# Patient Record
Sex: Female | Born: 1995 | Race: White | Hispanic: No | Marital: Married | Smoking: Current every day smoker
Health system: Southern US, Community
[De-identification: ages and names within clinical notes are randomized; demographics above are authoritative.]

## PROBLEM LIST (undated history)

## (undated) ENCOUNTER — Inpatient Hospital Stay: Payer: Self-pay

## (undated) ENCOUNTER — Inpatient Hospital Stay: Admission: RE | Payer: Medicaid Other | Source: Home / Self Care

## (undated) DIAGNOSIS — T7840XA Allergy, unspecified, initial encounter: Secondary | ICD-10-CM

## (undated) DIAGNOSIS — N12 Tubulo-interstitial nephritis, not specified as acute or chronic: Secondary | ICD-10-CM

## (undated) DIAGNOSIS — E669 Obesity, unspecified: Secondary | ICD-10-CM

## (undated) DIAGNOSIS — O2613 Low weight gain in pregnancy, third trimester: Secondary | ICD-10-CM

## (undated) DIAGNOSIS — I471 Supraventricular tachycardia, unspecified: Secondary | ICD-10-CM

## (undated) DIAGNOSIS — G43909 Migraine, unspecified, not intractable, without status migrainosus: Secondary | ICD-10-CM

## (undated) DIAGNOSIS — R001 Bradycardia, unspecified: Secondary | ICD-10-CM

## (undated) DIAGNOSIS — T8859XA Other complications of anesthesia, initial encounter: Secondary | ICD-10-CM

## (undated) DIAGNOSIS — K8020 Calculus of gallbladder without cholecystitis without obstruction: Secondary | ICD-10-CM

## (undated) DIAGNOSIS — Z3490 Encounter for supervision of normal pregnancy, unspecified, unspecified trimester: Secondary | ICD-10-CM

## (undated) DIAGNOSIS — F419 Anxiety disorder, unspecified: Secondary | ICD-10-CM

## (undated) DIAGNOSIS — N809 Endometriosis, unspecified: Secondary | ICD-10-CM

## (undated) DIAGNOSIS — T4145XA Adverse effect of unspecified anesthetic, initial encounter: Secondary | ICD-10-CM

## (undated) DIAGNOSIS — K219 Gastro-esophageal reflux disease without esophagitis: Secondary | ICD-10-CM

## (undated) DIAGNOSIS — Z62810 Personal history of physical and sexual abuse in childhood: Secondary | ICD-10-CM

## (undated) DIAGNOSIS — E66811 Obesity, class 1: Secondary | ICD-10-CM

## (undated) DIAGNOSIS — J45909 Unspecified asthma, uncomplicated: Secondary | ICD-10-CM

## (undated) DIAGNOSIS — B2790 Infectious mononucleosis, unspecified without complication: Secondary | ICD-10-CM

## (undated) DIAGNOSIS — O210 Mild hyperemesis gravidarum: Secondary | ICD-10-CM

## (undated) DIAGNOSIS — O321XX0 Maternal care for breech presentation, not applicable or unspecified: Secondary | ICD-10-CM

## (undated) HISTORY — DX: Obesity, unspecified: E66.9

## (undated) HISTORY — DX: Low weight gain in pregnancy, third trimester: O26.13

## (undated) HISTORY — PX: WISDOM TOOTH EXTRACTION: SHX21

## (undated) HISTORY — DX: Obesity, class 1: E66.811

## (undated) HISTORY — DX: Endometriosis, unspecified: N80.9

## (undated) HISTORY — DX: Maternal care for breech presentation, not applicable or unspecified: O32.1XX0

## (undated) HISTORY — DX: Encounter for supervision of normal pregnancy, unspecified, unspecified trimester: Z34.90

## (undated) HISTORY — DX: Personal history of physical and sexual abuse in childhood: Z62.810

## (undated) HISTORY — DX: Tubulo-interstitial nephritis, not specified as acute or chronic: N12

## (undated) HISTORY — DX: Calculus of gallbladder without cholecystitis without obstruction: K80.20

## (undated) HISTORY — PX: APPENDECTOMY: SHX54

## (undated) HISTORY — DX: Bradycardia, unspecified: R00.1

## (undated) HISTORY — DX: Allergy, unspecified, initial encounter: T78.40XA

---
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
## 2016-12-15 ENCOUNTER — Ambulatory Visit
Admission: EM | Admit: 2016-12-15 | Discharge: 2016-12-15 | Disposition: A | Discharge status: Home / Self Care | Payer: Medicaid Other | Attending: Family Medicine | Admitting: Family Medicine

## 2016-12-15 ENCOUNTER — Encounter: Payer: Self-pay | Admitting: *Deleted

## 2016-12-15 DIAGNOSIS — R42 Dizziness and giddiness: Secondary | ICD-10-CM | POA: Diagnosis not present

## 2016-12-15 DIAGNOSIS — R110 Nausea: Secondary | ICD-10-CM

## 2016-12-15 LAB — PREGNANCY, URINE: PREG TEST UR: NEGATIVE

## 2016-12-15 MED ORDER — MECLIZINE HCL 25 MG PO TABS
25.0000 mg | ORAL_TABLET | Freq: Three times a day (TID) | ORAL | 0 refills | Status: DC | PRN
Start: 2016-12-15 — End: 2017-04-26

## 2016-12-15 NOTE — ED Triage Notes (Signed)
C/O Dizzines with some nausea x2 weeks. Denies sore throat, fever or chills. Pt states she "might be pregnant". Pt states she has taken 4 pregnancy tests and they were all  negative.

## 2016-12-15 NOTE — ED Provider Notes (Signed)
[facility]   CSN: [id number] Arrival date & time: 12/15/16  1545   History   Chief Complaint Chief Complaint  Patient presents with  . Near Syncope  . Nausea   HPI  21 year old female presents with dizziness/lightheadedness and nausea.  Patient states she has had intermittent nausea and dizziness/lightheadedness for the past 2 weeks.  She has had approximately 10 times.  Worse with movement.  Lasts briefly and resolves, particularly with rest.  She states that the dizziness/lightheadedness seems to come on first.  She describes it as a sense of motion and as if she is going to fall.  Associated nausea but no vomiting.  She been trying to stay hydrated to improve this but has had no improvement.  No other medications or interventions have been tried.  She was concerned that she may be pregnant as she is no longer on birth control.  She is taken 4 home pregnancy tests and they have been all negative.  However, this was done several days ago.  Her last menstrual cycle was October 3.  She states that she is 7 days late for her current menstrual cycle.  However, she states that she has been irregular as of late and this is new.  In addition to the above, she states that she has had times where she felt as if her eyes were moving abruptly when she was dizzy/lightheaded.  No other associated symptoms.  No other complaints or concerns at this time.  Past Medical History:  Diagnosis Date  . Allergy    Seasonal  . Asthma   . Migraine   . Mononucleosis    Past Surgical History:  Procedure Laterality Date  . WISDOM TOOTH EXTRACTION     OB History    No data available     Home Medications    Family History Family History  Problem Relation Age of Onset  . Hypertension Mother   . Depression Mother   . Hypertension Maternal Grandmother    Social History Social History   Tobacco Use  . Smoking status: Never Smoker  . Smokeless tobacco: Never Used  Substance Use Topics    . Alcohol use: No  . Drug use: No   Allergies   Bee venom and Shellfish allergy   Review of Systems Review of Systems  Eyes:       Rapid eye movement.  Gastrointestinal: Positive for nausea. Negative for vomiting.  Neurological: Positive for dizziness and light-headedness.   Physical Exam Triage Vital Signs ED Triage Vitals  Enc Vitals Group     BP 12/15/16 1601 121/76     Pulse Rate 12/15/16 1601 (!) 58     Resp 12/15/16 1601 14     Temp 12/15/16 1601 98.1 F (36.7 C)     Temp Source 12/15/16 1601 Oral     SpO2 12/15/16 1601 99 %     Weight 12/15/16 1602 217 lb (98.4 kg)     Height 12/15/16 1602 5\' 6"  (1.676 m)     Head Circumference --      Peak Flow --      Pain Score --      Pain Loc --      Pain Edu? --      Excl. in GC? --    Updated Vital Signs BP 121/76 (BP Location: Left Arm)   Pulse (!) 58   Temp 98.1 F (36.7 C) (Oral)   Resp 14   Ht 5\' 6"  (1.676 m)  Wt 217 lb (98.4 kg)   LMP 11/09/2016   SpO2 99%   BMI 35.02 kg/m   Physical Exam  Constitutional: She is oriented to person, place, and time. She appears well-developed and well-nourished. No distress.  HENT:  Head: Normocephalic and atraumatic.  Mouth/Throat: Oropharynx is clear and moist.  Eyes: Conjunctivae and EOM are normal. No scleral icterus.  Neck: Neck supple.  Cardiovascular: Regular rhythm.  No murmur heard. Bradycardic.  Pulmonary/Chest: Effort normal and breath sounds normal. She has no wheezes. She has no rales.  Lymphadenopathy:    She has no cervical adenopathy.  Neurological: She is alert and oriented to person, place, and time.  Gilberto Betterix Hallpike - symptoms provoked with test but no nystagmus noted.  Psychiatric: She has a normal mood and affect. Her behavior is normal.  Vitals reviewed.  UC Treatments / Results  Labs (all labs ordered are listed, but only abnormal results are displayed) Labs Reviewed  PREGNANCY, URINE    EKG  EKG Interpretation None        Radiology No results found.  Procedures Procedures (including critical care time)  Medications Ordered in UC Medications - No data to display   Initial Impression / Assessment and Plan / UC Course  I have reviewed the triage vital signs and the nursing notes.  Pertinent labs & imaging results that were available during my care of the patient were reviewed by me and considered in my medical decision making (see chart for details).     21 year old female presents with nausea, lightheadedness/dizziness. Pregnancy test negative. Clinically appears to have vertigo. Treating with Meclizine. Advised vestibular rehab (to come from primary referral) if does not improve quickly.   Final Clinical Impressions(s) / UC Diagnoses   Final diagnoses:  Vertigo    ED Discharge Orders        Ordered    meclizine (ANTIVERT) 25 MG tablet  3 times daily PRN     12/15/16 1635     Controlled Substance Prescriptions [place] Controlled Substance Registry consulted? Not Applicable   Tommie SamsCook,  G, DO 12/15/16 1642

## 2017-02-07 NOTE — L&D Delivery Note (Signed)
Delivery Summary for Shontae M Boice  Labor Events:   Preterm labor:   Rupture date:   Rupture time:   Rupture type:   Fluid Color:   Induction:   Augmentation:   Complications:   Cervical ripening:          Delivery:   Episiotomy:   Lacerations:   Repair suture:   Repair # of packets:   Blood loss (ml):    Information for the patient's newborn:  Jerrol BananaCook, Boy Alysen [[id number]][[id number]]    Delivery 09/27/2017 12:36 PM by  C-Section, Low Transverse Sex:  female Gestational Age: 1771w2d Delivery Clinician:   Living?:         APGARS  One minute Five minutes Ten minutes  Skin color:        Heart rate:        Grimace:        Muscle tone:        Breathing:        Totals: 8  8      Presentation/position:      Resuscitation:   Cord information:    Disposition of cord blood:     Blood gases sent?  Complications:   Placenta: Delivered:       appearance Newborn Measurements: Weight: 8 lb 9.6 oz (3900 g)  Height: 20.5"  Head circumference:    Chest circumference:    Other providers:    Additional  information: Forceps:   Vacuum:   Breech:   Observed anomalies        See Dr. Oretha Milchherry's C-section note for details of procedure.   Hildred Laserherry, , MD [facility]

## 2017-03-01 LAB — OB RESULTS CONSOLE ANTIBODY SCREEN: ANTIBODY SCREEN: NEGATIVE

## 2017-03-01 LAB — OB RESULTS CONSOLE RUBELLA ANTIBODY, IGM: Rubella: NON-IMMUNE/NOT IMMUNE

## 2017-03-01 LAB — OB RESULTS CONSOLE HGB/HCT, BLOOD
HCT: 38
Hemoglobin: 12.5

## 2017-03-01 LAB — OB RESULTS CONSOLE HEPATITIS B SURFACE ANTIGEN: HEP B S AG: NEGATIVE

## 2017-03-01 LAB — OB RESULTS CONSOLE GC/CHLAMYDIA
CHLAMYDIA, DNA PROBE: NEGATIVE
GC PROBE AMP, GENITAL: NEGATIVE

## 2017-03-01 LAB — OB RESULTS CONSOLE PLATELET COUNT: Platelets: 235

## 2017-03-01 LAB — OB RESULTS CONSOLE ABO/RH: RH Type: POSITIVE

## 2017-03-01 LAB — CYSTIC FIBROSIS DIAGNOSTIC STUDY: INTERPRETATION-CFDNA: NEGATIVE

## 2017-03-01 LAB — OB RESULTS CONSOLE VARICELLA ZOSTER ANTIBODY, IGG: VARICELLA IGG: NON-IMMUNE/NOT IMMUNE

## 2017-03-01 LAB — OB RESULTS CONSOLE HIV ANTIBODY (ROUTINE TESTING): HIV: NONREACTIVE

## 2017-03-15 DIAGNOSIS — Z315 Encounter for genetic counseling: Secondary | ICD-10-CM | POA: Insufficient documentation

## 2017-03-28 ENCOUNTER — Emergency Department
Admission: EM | Admit: 2017-03-28 | Discharge: 2017-03-28 | Disposition: A | Discharge status: Home / Self Care | Payer: PRIVATE HEALTH INSURANCE | Attending: Emergency Medicine | Admitting: Emergency Medicine

## 2017-03-28 ENCOUNTER — Encounter: Payer: Self-pay | Admitting: Emergency Medicine

## 2017-03-28 DIAGNOSIS — O2342 Unspecified infection of urinary tract in pregnancy, second trimester: Secondary | ICD-10-CM | POA: Insufficient documentation

## 2017-03-28 DIAGNOSIS — Z3A13 13 weeks gestation of pregnancy: Secondary | ICD-10-CM | POA: Insufficient documentation

## 2017-03-28 DIAGNOSIS — J45909 Unspecified asthma, uncomplicated: Secondary | ICD-10-CM | POA: Diagnosis not present

## 2017-03-28 DIAGNOSIS — O210 Mild hyperemesis gravidarum: Secondary | ICD-10-CM | POA: Insufficient documentation

## 2017-03-28 DIAGNOSIS — O2341 Unspecified infection of urinary tract in pregnancy, first trimester: Secondary | ICD-10-CM

## 2017-03-28 DIAGNOSIS — O219 Vomiting of pregnancy, unspecified: Secondary | ICD-10-CM | POA: Diagnosis present

## 2017-03-28 LAB — URINALYSIS, COMPLETE (UACMP) WITH MICROSCOPIC
Bacteria, UA: NONE SEEN
Bilirubin Urine: NEGATIVE
Glucose, UA: NEGATIVE mg/dL
Hgb urine dipstick: NEGATIVE
Ketones, ur: 80 mg/dL — AB
NITRITE: NEGATIVE
PH: 6 (ref 5.0–8.0)
Protein, ur: 30 mg/dL — AB
Specific Gravity, Urine: 1.024 (ref 1.005–1.030)

## 2017-03-28 LAB — COMPREHENSIVE METABOLIC PANEL
ALBUMIN: 4.2 g/dL (ref 3.5–5.0)
ALK PHOS: 54 U/L (ref 38–126)
ALT: 22 U/L (ref 14–54)
ANION GAP: 9 (ref 5–15)
AST: 23 U/L (ref 15–41)
BUN: 7 mg/dL (ref 6–20)
CALCIUM: 9.4 mg/dL (ref 8.9–10.3)
CHLORIDE: 106 mmol/L (ref 101–111)
CO2: 23 mmol/L (ref 22–32)
Creatinine, Ser: 0.56 mg/dL (ref 0.44–1.00)
GFR calc non Af Amer: >60 mL/min (ref >60)
Glucose, Bld: 84 mg/dL (ref 65–99)
POTASSIUM: 3.8 mmol/L (ref 3.5–5.1)
SODIUM: 138 mmol/L (ref 135–145)
Total Bilirubin: 1.3 mg/dL — ABNORMAL HIGH (ref 0.3–1.2)
Total Protein: 8.1 g/dL (ref 6.5–8.1)

## 2017-03-28 LAB — CBC
HCT: 41 % (ref 35.0–47.0)
HEMOGLOBIN: 13.9 g/dL (ref 12.0–16.0)
MCH: 28.2 pg (ref 26.0–34.0)
MCHC: 33.8 g/dL (ref 32.0–36.0)
MCV: 83.5 fL (ref 80.0–100.0)
PLATELETS: 234 10*3/uL (ref 150–440)
RBC: 4.91 MIL/uL (ref 3.80–5.20)
RDW: 14.4 % (ref 11.5–14.5)
WBC: 9.8 10*3/uL (ref 3.6–11.0)

## 2017-03-28 LAB — HCG, QUANTITATIVE, PREGNANCY: hCG, Beta Chain, Quant, S: 69458 m[IU]/mL — ABNORMAL HIGH (ref <5)

## 2017-03-28 MED ORDER — NITROFURANTOIN MONOHYD MACRO 100 MG PO CAPS
100.0000 mg | ORAL_CAPSULE | Freq: Once | ORAL | Status: AC
  Administered 2017-03-28: 100 mg via ORAL
  Filled 2017-03-28 (×2): qty 1

## 2017-03-28 MED ORDER — METOCLOPRAMIDE HCL 5 MG/ML IJ SOLN
10.0000 mg | Freq: Once | INTRAMUSCULAR | Status: AC
  Administered 2017-03-28: 10 mg via INTRAVENOUS
  Filled 2017-03-28: qty 2

## 2017-03-28 MED ORDER — PROMETHAZINE HCL 25 MG RE SUPP: 25.0000 mg | Freq: Four times a day (QID) | RECTAL | 0 refills | Status: DC | PRN

## 2017-03-28 MED ORDER — DOXYLAMINE-PYRIDOXINE 10-10 MG PO TBEC
2.0000 | DELAYED_RELEASE_TABLET | Freq: Three times a day (TID) | ORAL | 0 refills | Status: DC
Start: 2017-03-28 — End: 2017-04-26

## 2017-03-28 MED ORDER — DEXTROSE-NACL 5-0.9 % IV SOLN
INTRAVENOUS | Status: AC
  Administered 2017-03-28: 13:00:00 via INTRAVENOUS

## 2017-03-28 MED ORDER — NITROFURANTOIN MONOHYD MACRO 100 MG PO CAPS: 100.0000 mg | ORAL_CAPSULE | Freq: Two times a day (BID) | ORAL | 0 refills | Status: AC

## 2017-03-28 NOTE — ED Triage Notes (Signed)
Pt states she has not been able to keep anything down the past four days and is [redacted] weeks pregnant.

## 2017-03-28 NOTE — ED Provider Notes (Signed)
[facility] Emergency Department Provider Note  ____________________________________________  Time seen: Approximately 12:37 PM  I have reviewed the triage vital signs and the nursing notes.   HISTORY  Chief Complaint Emesis   HPI Tiffany Andrews is a 22 y.o. female G1P0 at 7813 weeks GA who presents for evaluation of nausea and vomiting. Patient reports that she has been struggling this entire pregnancy with nausea and vomiting. She reports that she tried Unisom and vitamin B6 at home with no significant help. She was then put on Reglan which also didn't help. Then her OB/GYN tried Phenergan which helped control her nausea and vomiting. She reports that she is currently in the process [facility] due to closer proximity to her house.She reports that 4 days ago she ran out of Phenergan however when [facility] they refused to give her a refill since she was no longer a patient there. She reports that she has been unable to keep anything down for 4 days. She feels very dehydrated. Her nausea is severe and constant and associated with several daily episodes of NBNB emesis. She denies abdominal pain, dysuria, vaginal bleeding, diarrhea, constipation, URI symptoms, chest pain or shortness of breath.  Past Medical History:  Diagnosis Date  . Allergy    Seasonal  . Asthma   . Migraine   . Mononucleosis     There are no active problems to display for this patient.   Past Surgical History:  Procedure Laterality Date  . LAPAROSCOPIC APPENDECTOMY N/A 05/13/2015   Procedure: APPENDECTOMY LAPAROSCOPIC;  Surgeon: Leafy Roiego F Pabon, MD;  Location: [facility] ORS;  Service: General;  Laterality: N/A;  . WISDOM TOOTH EXTRACTION      Prior to Admission medications   Medication Sig Start Date End Date Taking? Authorizing Provider  Doxylamine-Pyridoxine 10-10 MG TBEC Take 2 tablets by mouth 3 (three) times daily. 03/28/17    Nita SickleVeronese, [place], MD  meclizine (ANTIVERT) 25 MG tablet Take 1 tablet (25 mg total) 3 (three) times daily as needed by mouth for dizziness. 12/15/16   Tommie Samsook, Jayce G, DO  nitrofurantoin, macrocrystal-monohydrate, (MACROBID) 100 MG capsule Take 1 capsule (100 mg total) by mouth 2 (two) times daily for 5 days. 03/28/17 04/02/17  Nita SickleVeronese, [place], MD  promethazine (PHENERGAN) 25 MG suppository Place 1 suppository (25 mg total) rectally every 6 (six) hours as needed for nausea. 03/28/17 03/28/18  Nita SickleVeronese, [place], MD    Allergies Bee venom and Shellfish allergy  Family History  Problem Relation Age of Onset  . Hypertension Mother   . Depression Mother   . Hypertension Maternal Grandmother     Social History Social History   Tobacco Use  . Smoking status: Never Smoker  . Smokeless tobacco: Never Used  Substance Use Topics  . Alcohol use: No  . Drug use: No    Review of Systems  Constitutional: Negative for fever. Eyes: Negative for visual changes. ENT: Negative for sore throat. Neck: No neck pain  Cardiovascular: Negative for chest pain. Respiratory: Negative for shortness of breath. Gastrointestinal: Negative for abdominal pain,  Diarrhea. + N/V Genitourinary: Negative for dysuria. Musculoskeletal: Negative for back pain. Skin: Negative for rash. Neurological: Negative for headaches, weakness or numbness. Psych: No SI or HI  ____________________________________________   PHYSICAL EXAM:  VITAL SIGNS: ED Triage Vitals  Enc Vitals Group     BP 03/28/17 1153 123/74     Pulse Rate 03/28/17 1153 71  Resp 03/28/17 1153 20     Temp 03/28/17 1153 98.5 F (36.9 C)     Temp Source 03/28/17 1153 Oral     SpO2 03/28/17 1153 99 %     Weight 03/28/17 1154 217 lb (98.4 kg)     Height --      Head Circumference --      Peak Flow --      Pain Score --      Pain Loc --      Pain Edu? --      Excl. in GC? --     Constitutional: Alert and oriented. Well appearing and in  no apparent distress. HEENT:      Head: Normocephalic and atraumatic.         Eyes: Conjunctivae are normal. Sclera is non-icteric.       Mouth/Throat: Mucous membranes are dry.       Neck: Supple with no signs of meningismus. Cardiovascular: Regular rate and rhythm. No murmurs, gallops, or rubs. 2+ symmetrical distal pulses are present in all extremities. No JVD. Respiratory: Normal respiratory effort. Lungs are clear to auscultation bilaterally. No wheezes, crackles, or rhonchi.  Gastrointestinal: Soft, non tender, and non distended with positive bowel sounds. No rebound or guarding. Genitourinary: No CVA tenderness. Musculoskeletal: Nontender with normal range of motion in all extremities. No edema, cyanosis, or erythema of extremities. Neurologic: Normal speech and language. Face is symmetric. Moving all extremities. No gross focal neurologic deficits are appreciated. Skin: Skin is warm, dry and intact. No rash noted. Psychiatric: Mood and affect are normal. Speech and behavior are normal.  ____________________________________________   LABS (all labs ordered are listed, but only abnormal results are displayed)  Labs Reviewed  COMPREHENSIVE METABOLIC PANEL - Abnormal; Notable for the following components:      Result Value   Total Bilirubin 1.3 (*)    All other components within normal limits  URINALYSIS, COMPLETE (UACMP) WITH MICROSCOPIC - Abnormal; Notable for the following components:   Color, Urine AMBER (*)    APPearance CLOUDY (*)    Ketones, ur 80 (*)    Protein, ur 30 (*)    Leukocytes, UA LARGE (*)    Squamous Epithelial / LPF 6-30 (*)    Crystals PRESENT (*)    All other components within normal limits  HCG, QUANTITATIVE, PREGNANCY - Abnormal; Notable for the following components:   hCG, Beta Chain, Quant, S [id number] (*)    All other components within normal limits  URINE CULTURE  CBC   ____________________________________________  EKG  none    ____________________________________________  RADIOLOGY  none  ____________________________________________   PROCEDURES  Procedure(s) performed: None Procedures Critical Care performed:  None ____________________________________________   INITIAL IMPRESSION / ASSESSMENT AND PLAN / ED COURSE  22 y.o. female G1P0 at [redacted] weeks GA who presents for evaluation of nausea and vomiting and inability to tolerate by mouth for 4 days since running out of her prescription Phenergan. Patient looks dry and exam but is hemodynamically stable. Labs showed normal CBC, normal CMP with no anion gap, normal kidney function, normal electrolytes. UA showing ketonuria and UTI. Will give D5NS bolus and IV reglan. Will start patient on macrobid. Will get POCUS to evaluate fetal well being.   Clinical Course as of Mar 28 1502  Tue Mar 28, 2017  1501 Patient reports that she feels markedly improved, tolerating by mouth. Bedside ultrasound showing normal fetal activity with fetal heart rate of 158. Patient has her first appointment with  a new OB/GYN tomorrow. We'll send her home with a short prescription for Phenergan and Macrobid for a UTI.  [CV]    Clinical Course User Index [CV] Don Perking [place], MD     As part of my medical decision making, I reviewed the following data within the electronic MEDICAL RECORD NUMBER Nursing notes reviewed and incorporated, Labs reviewed , Notes from prior ED visits and [place] Controlled Substance Database    Pertinent labs & imaging results that were available during my care of the patient were reviewed by me and considered in my medical decision making (see chart for details).    ____________________________________________   FINAL CLINICAL IMPRESSION(S) / ED DIAGNOSES  Final diagnoses:  Hyperemesis gravidarum  Urinary tract infection in mother during first trimester of pregnancy      NEW MEDICATIONS STARTED DURING THIS VISIT:  ED Discharge Orders        Ordered     promethazine (PHENERGAN) 25 MG suppository  Every 6 hours PRN     03/28/17 1504    nitrofurantoin, macrocrystal-monohydrate, (MACROBID) 100 MG capsule  2 times daily     03/28/17 1504    Doxylamine-Pyridoxine 10-10 MG TBEC  3 times daily     03/28/17 1504       Note:  This document was prepared using Dragon voice recognition software and may include unintentional dictation errors.    Don Perking, [place], MD 03/28/17 [phone]

## 2017-03-29 ENCOUNTER — Encounter: Payer: Self-pay | Admitting: Obstetrics and Gynecology

## 2017-03-29 LAB — URINE CULTURE

## 2017-04-19 ENCOUNTER — Encounter: Payer: Self-pay | Admitting: Obstetrics and Gynecology

## 2017-04-26 ENCOUNTER — Encounter: Payer: Self-pay | Admitting: Obstetrics and Gynecology

## 2017-04-26 ENCOUNTER — Ambulatory Visit (INDEPENDENT_AMBULATORY_CARE_PROVIDER_SITE_OTHER): Payer: Medicaid Other | Admitting: Obstetrics and Gynecology

## 2017-04-26 VITALS — BP 117/78 | HR 90 | Wt 199.0 lb

## 2017-04-26 DIAGNOSIS — Z3402 Encounter for supervision of normal first pregnancy, second trimester: Secondary | ICD-10-CM

## 2017-04-26 DIAGNOSIS — Z1379 Encounter for other screening for genetic and chromosomal anomalies: Secondary | ICD-10-CM

## 2017-04-26 DIAGNOSIS — E669 Obesity, unspecified: Secondary | ICD-10-CM | POA: Insufficient documentation

## 2017-04-26 DIAGNOSIS — O219 Vomiting of pregnancy, unspecified: Secondary | ICD-10-CM

## 2017-04-26 DIAGNOSIS — E66811 Obesity, class 1: Secondary | ICD-10-CM | POA: Insufficient documentation

## 2017-04-26 LAB — POCT URINALYSIS DIPSTICK
BILIRUBIN UA: NEGATIVE
GLUCOSE UA: NEGATIVE
Ketones, UA: NEGATIVE
LEUKOCYTES UA: NEGATIVE
Nitrite, UA: NEGATIVE
PH UA: 6 (ref 5.0–8.0)
Protein, UA: NEGATIVE
RBC UA: NEGATIVE
UROBILINOGEN UA: 0.2 U/dL

## 2017-04-26 NOTE — Progress Notes (Signed)
ROB-pt was having severe morning sickness and is not taking any prenatal vitamins due to gagging reflex.
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
## 2017-04-29 ENCOUNTER — Encounter: Payer: Self-pay | Admitting: Obstetrics and Gynecology

## 2017-04-29 DIAGNOSIS — R1011 Right upper quadrant pain: Secondary | ICD-10-CM

## 2017-04-30 ENCOUNTER — Emergency Department
Admission: EM | Admit: 2017-04-30 | Discharge: 2017-04-30 | Disposition: A | Discharge status: Home / Self Care | Payer: Medicaid Other | Attending: Emergency Medicine | Admitting: Emergency Medicine

## 2017-04-30 ENCOUNTER — Other Ambulatory Visit: Payer: Self-pay

## 2017-04-30 ENCOUNTER — Ambulatory Visit
Admission: EM | Admit: 2017-04-30 | Discharge: 2017-04-30 | Disposition: A | Discharge status: Home / Self Care | Payer: Medicaid Other | Attending: Family Medicine | Admitting: Family Medicine

## 2017-04-30 DIAGNOSIS — R1112 Projectile vomiting: Secondary | ICD-10-CM | POA: Diagnosis not present

## 2017-04-30 DIAGNOSIS — J45909 Unspecified asthma, uncomplicated: Secondary | ICD-10-CM | POA: Diagnosis not present

## 2017-04-30 DIAGNOSIS — R112 Nausea with vomiting, unspecified: Secondary | ICD-10-CM

## 2017-04-30 DIAGNOSIS — R42 Dizziness and giddiness: Secondary | ICD-10-CM

## 2017-04-30 DIAGNOSIS — R1011 Right upper quadrant pain: Secondary | ICD-10-CM

## 2017-04-30 DIAGNOSIS — N390 Urinary tract infection, site not specified: Secondary | ICD-10-CM

## 2017-04-30 DIAGNOSIS — Z79899 Other long term (current) drug therapy: Secondary | ICD-10-CM | POA: Insufficient documentation

## 2017-04-30 LAB — CBC
HCT: 37.1 % (ref 35.0–47.0)
Hemoglobin: 12.8 g/dL (ref 12.0–16.0)
MCH: 28.9 pg (ref 26.0–34.0)
MCHC: 34.5 g/dL (ref 32.0–36.0)
MCV: 83.6 fL (ref 80.0–100.0)
PLATELETS: 241 10*3/uL (ref 150–440)
RBC: 4.43 MIL/uL (ref 3.80–5.20)
RDW: 14.3 % (ref 11.5–14.5)
WBC: 11.1 10*3/uL — AB (ref 3.6–11.0)

## 2017-04-30 LAB — URINALYSIS, COMPLETE (UACMP) WITH MICROSCOPIC
Bacteria, UA: NONE SEEN
Bilirubin Urine: NEGATIVE
GLUCOSE, UA: NEGATIVE mg/dL
Hgb urine dipstick: NEGATIVE
Ketones, ur: 80 mg/dL — AB
Nitrite: NEGATIVE
PH: 6 (ref 5.0–8.0)
Protein, ur: NEGATIVE mg/dL
SPECIFIC GRAVITY, URINE: 1.024 (ref 1.005–1.030)

## 2017-04-30 LAB — COMPREHENSIVE METABOLIC PANEL
ALK PHOS: 61 U/L (ref 38–126)
ALT: 12 U/L — AB (ref 14–54)
AST: 15 U/L (ref 15–41)
Albumin: 3.6 g/dL (ref 3.5–5.0)
Anion gap: 9 (ref 5–15)
BILIRUBIN TOTAL: 0.7 mg/dL (ref 0.3–1.2)
BUN: 5 mg/dL — AB (ref 6–20)
CALCIUM: 9 mg/dL (ref 8.9–10.3)
CHLORIDE: 105 mmol/L (ref 101–111)
CO2: 22 mmol/L (ref 22–32)
CREATININE: 0.55 mg/dL (ref 0.44–1.00)
Glucose, Bld: 86 mg/dL (ref 65–99)
Potassium: 3.8 mmol/L (ref 3.5–5.1)
Sodium: 136 mmol/L (ref 135–145)
TOTAL PROTEIN: 7.5 g/dL (ref 6.5–8.1)

## 2017-04-30 LAB — MATERNIT21  PLUS CORE+ESS+SCA, BLOOD
CHROMOSOME 13: NEGATIVE
CHROMOSOME 18: NEGATIVE
CHROMOSOME 21: NEGATIVE
Y CHROMOSOME: DETECTED

## 2017-04-30 LAB — LIPASE, BLOOD: Lipase: 29 U/L (ref 11–51)

## 2017-04-30 MED ORDER — SODIUM CHLORIDE 0.9 % IV BOLUS (SEPSIS)
1000.0000 mL | Freq: Once | INTRAVENOUS | Status: AC
Start: 2017-04-30 — End: 2017-04-30
  Administered 2017-04-30: 1000 mL via INTRAVENOUS

## 2017-04-30 MED ORDER — ALUM & MAG HYDROXIDE-SIMETH 200-200-20 MG/5ML PO SUSP
15.0000 mL | Freq: Once | ORAL | Status: AC
  Administered 2017-04-30: 15 mL via ORAL
  Filled 2017-04-30 (×2): qty 30

## 2017-04-30 MED ORDER — NITROFURANTOIN MONOHYD MACRO 100 MG PO CAPS
100.0000 mg | ORAL_CAPSULE | Freq: Once | ORAL | Status: AC
  Administered 2017-04-30: 100 mg via ORAL
  Filled 2017-04-30 (×2): qty 1

## 2017-04-30 MED ORDER — PROMETHAZINE HCL 25 MG RE SUPP: 25.0000 mg | Freq: Four times a day (QID) | RECTAL | 0 refills | Status: DC | PRN

## 2017-04-30 MED ORDER — PROMETHAZINE HCL 25 MG PO TABS: 25.0000 mg | ORAL_TABLET | Freq: Four times a day (QID) | ORAL | 0 refills | Status: DC | PRN

## 2017-04-30 MED ORDER — NITROFURANTOIN MONOHYD MACRO 100 MG PO CAPS: 100.0000 mg | ORAL_CAPSULE | Freq: Two times a day (BID) | ORAL | 0 refills | Status: AC

## 2017-04-30 NOTE — Discharge Instructions (Addendum)
Recommend contact your OB today for further instructions- may need to go to the ER.  Do not see any fluid in your ear that is causing your symptoms. May continue Phenergan 25mg  suppositories as needed for vomiting. Follow-up with your OB as planned.
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
## 2017-04-30 NOTE — ED Triage Notes (Signed)
Pt presents via POV c/o vomiting since Wednesday, streak of blood in emesis, rib pain, and dizziness. Reports seen Urgent Care today but sent to ED by OBGYN. Pt ambulatory to triage in NAD.
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
## 2017-04-30 NOTE — ED Triage Notes (Signed)
Pt with vomiting since Wednesday and vertigo sx. Called her OBGYN and was told to come and have her inner ear checked. Pain in left ear.

## 2017-04-30 NOTE — ED Notes (Signed)
Reviewed discharge instructions, follow-up care, and prescriptions with patient. Patient verbalized understanding of all information reviewed. Patient stable, with no distress noted at this time.    

## 2017-04-30 NOTE — ED Notes (Addendum)
Pt states she has had several episodes of projectile vomiting last Wednesday. Pt states she has not had morning sickness in a couple weeks. Pt states she has RUQ abdominal tenderness, pt is 17 weeks and 6 days, states [facility].  Pt states emesis has had blood streaks in it. Pt also c/o dizziness.

## 2017-05-01 ENCOUNTER — Other Ambulatory Visit: Payer: Medicaid Other

## 2017-05-01 NOTE — Telephone Encounter (Signed)
Contacted patient regarding complaints of projectile vomiting since last week. Denies any recent sick contacts, has not had any changes in her diet. Notes that the episodes are sudden and come on without warning. Denies nausea prior to the episodes. Does note some RUQ/rib pain that has been ongoing since last week.  Advised patient to come in to be scheduled for an ultrasound today at 3:00 pm (notes she is out of town right now and won't be back until 2:00 pm.)    Hildred Laserherry, , MD [facility]

## 2017-05-02 ENCOUNTER — Ambulatory Visit (INDEPENDENT_AMBULATORY_CARE_PROVIDER_SITE_OTHER): Payer: Medicaid Other

## 2017-05-02 ENCOUNTER — Encounter: Payer: Self-pay | Admitting: Obstetrics and Gynecology

## 2017-05-02 DIAGNOSIS — Z3402 Encounter for supervision of normal first pregnancy, second trimester: Secondary | ICD-10-CM

## 2017-05-02 LAB — URINE CULTURE

## 2017-05-02 LAB — AFP, SERUM, OPEN SPINA BIFIDA
AFP MoM: 1.27
AFP Value: 40.7 ng/mL
GEST. AGE ON COLLECTION DATE: 17.3 wk
Maternal Age At EDD: 21.1 yr
OSBR RISK 1 IN: 5251
TEST RESULTS AFP: NEGATIVE
Weight: 199 [lb_av]

## 2017-05-03 ENCOUNTER — Encounter: Payer: Self-pay | Admitting: Obstetrics and Gynecology

## 2017-05-04 ENCOUNTER — Ambulatory Visit (INDEPENDENT_AMBULATORY_CARE_PROVIDER_SITE_OTHER): Payer: Medicaid Other

## 2017-05-04 DIAGNOSIS — R1011 Right upper quadrant pain: Secondary | ICD-10-CM | POA: Diagnosis not present

## 2017-05-05 ENCOUNTER — Telehealth: Payer: Self-pay | Admitting: Obstetrics and Gynecology

## 2017-05-05 NOTE — Telephone Encounter (Signed)
Contacted patient regarding ultrasound results in light of patient's new symptoms of nausea/vomiting and RUQ pain.  Ultrasound notes the presence of gallstones.  Advised patient that as long as her symptoms were able to managed with anti-emetics and tylenol for pain, then we can continue to manage expectantly until after the pregnancy. If she begins to experience fevers, chills, worsening nausea/vomiting or pain despite use of medications, she should follow up in the Emergency Room.  If it becomes serious enough, discussed that she may be required to have her gallbladder removed even during the pregnancy, if infection is present.  Patient notes understanding. Will f/u in clinic next week.

## 2017-05-05 NOTE — Telephone Encounter (Signed)
AC contacted pt

## 2017-05-05 NOTE — Telephone Encounter (Signed)
The patient called and stated that she would like to speak with a her nurse or Dr. Valentino Saxonherry in regards to her results from her most recent u/s. No other information was disclosed. Please advise.

## 2017-05-09 ENCOUNTER — Ambulatory Visit (INDEPENDENT_AMBULATORY_CARE_PROVIDER_SITE_OTHER): Payer: Medicaid Other | Admitting: Obstetrics and Gynecology

## 2017-05-09 VITALS — BP 95/61 | HR 64 | Wt 204.1 lb

## 2017-05-09 DIAGNOSIS — Z3482 Encounter for supervision of other normal pregnancy, second trimester: Secondary | ICD-10-CM

## 2017-05-09 DIAGNOSIS — R1112 Projectile vomiting: Secondary | ICD-10-CM

## 2017-05-09 DIAGNOSIS — K8020 Calculus of gallbladder without cholecystitis without obstruction: Secondary | ICD-10-CM | POA: Diagnosis not present

## 2017-05-09 NOTE — Progress Notes (Signed)
Pt is having a lot pain from gallstones.

## 2017-05-09 NOTE — Patient Instructions (Signed)
Cholelithiasis °Cholelithiasis is a form of gallbladder disease in which gallstones form in the gallbladder. The gallbladder is an organ that stores bile. Bile is made in the liver, and it helps to digest fats. Gallstones begin as small crystals and slowly grow into stones. They may cause no symptoms until the gallbladder tightens (contracts) and a gallstone is blocking the duct (gallbladder attack), which can cause pain. Cholelithiasis is also referred to as gallstones. °There are two main types of gallstones: °· Cholesterol stones. These are made of hardened cholesterol and are usually yellow-green in color. They are the most common type of gallstone. Cholesterol is a white, waxy, fat-like substance that is made in the liver. °· Pigment stones. These are dark in color and are made of a red-yellow substance that forms when hemoglobin from red blood cells breaks down (bilirubin). ° °What are the causes? °This condition may be caused by an imbalance in the substances that bile is made of. This can happen if the bile: °· Has too much bilirubin. °· Has too much cholesterol. °· Does not have enough bile salts. These salts help the body absorb and digest fats. ° °In some cases, this condition can also be caused by the gallbladder not emptying completely or often enough. °What increases the risk? °The following factors may make you more likely to develop this condition: °· Being female. °· Having multiple pregnancies. Health care providers sometimes advise removing diseased gallbladders before future pregnancies. °· Eating a diet that is heavy in fried foods, fat, and refined carbohydrates, like white bread and white rice. °· Being obese. °· Being older than age 40. °· Prolonged use of medicines that contain female hormones (estrogen). °· Having diabetes mellitus. °· Rapidly losing weight. °· Having a family history of gallstones. °· Being of American Indian or Mexican descent. °· Having an intestinal disease such as  Crohn disease. °· Having metabolic syndrome. °· Having cirrhosis. °· Having severe types of anemia such as sickle cell anemia. ° °What are the signs or symptoms? °In most cases, there are no symptoms. These are known as silent gallstones. If a gallstone blocks the bile ducts, it can cause a gallbladder attack. The main symptom of a gallbladder attack is sudden pain in the upper right abdomen. The pain usually comes at night or after eating a large meal. The pain can last for one or several hours and can spread to the right shoulder or chest. °If the bile duct is blocked for more than a few hours, it can cause infection or inflammation of the gallbladder, liver, or pancreas, which may cause: °· Nausea. °· Vomiting. °· Abdominal pain that lasts for 5 hours or more. °· Fever or chills. °· Yellowing of the skin or the whites of the eyes (jaundice). °· Dark urine. °· Light-colored stools. ° °How is this diagnosed? °This condition may be diagnosed based on: °· A physical exam. °· Your medical history. °· An ultrasound of your gallbladder. °· CT scan. °· MRI. °· Blood tests to check for signs of infection or inflammation. °· A scan of your gallbladder and bile ducts (biliary system) using nonharmful radioactive material and special cameras that can see the radioactive material (cholescintigram). This test checks to see how your gallbladder contracts and whether bile ducts are blocked. °· Inserting a small tube with a camera on the end (endoscope) through your mouth to inspect bile ducts and check for blockages (endoscopic retrograde cholangiopancreatogram). ° °How is this treated? °Treatment for gallstones depends on the   severity of the condition. Silent gallstones do not need treatment. If the gallstones cause a gallbladder attack or other symptoms, treatment may be required. Options for treatment include: °· Surgery to remove the gallbladder (cholecystectomy). This is the most common treatment. °· Medicines to dissolve  gallstones. These are most effective at treating small gallstones. You may need to take medicines for up to 6-12 months. °· Shock wave treatment (extracorporeal biliary lithotripsy). In this treatment, an ultrasound machine sends shock waves to the gallbladder to break gallstones into smaller pieces. These pieces can then be passed into the intestines or be dissolved by medicine. This is rarely used. °· Removing gallstones through endoscopic retrograde cholangiopancreatogram. A small basket can be attached to the endoscope and used to capture and remove gallstones. ° °Follow these instructions at home: °· Take over-the-counter and prescription medicines only as told by your health care provider. °· Maintain a healthy weight and follow a healthy diet. This includes: °? Reducing fatty foods, such as fried food. °? Reducing refined carbohydrates, like white bread and white rice. °? Increasing fiber. Aim for foods like almonds, fruit, and beans. °· Keep all follow-up visits as told by your health care provider. This is important. °Contact a health care provider if: °· You think you have had a gallbladder attack. °· You have been diagnosed with silent gallstones and you develop abdominal pain or indigestion. °Get help right away if: °· You have pain from a gallbladder attack that lasts for more than 2 hours. °· You have abdominal pain that lasts for more than 5 hours. °· You have a fever or chills. °· You have persistent nausea and vomiting. °· You develop jaundice. °· You have dark urine or light-colored stools. °Summary °· Cholelithiasis (also called gallstones) is a form of gallbladder disease in which gallstones form in the gallbladder. °· This condition is caused by an imbalance in the substances that make up bile. This can happen if the bile has too much cholesterol, too much bilirubin, or not enough bile salts. °· You are more likely to develop this condition if you are female, pregnant, using medicines with  estrogen, obese, older than age 40, or have a family history of gallstones. You may also develop gallstones if you have diabetes, an intestinal disease, cirrhosis, or metabolic syndrome. °· Treatment for gallstones depends on the severity of the condition. Silent gallstones do not need treatment. °· If gallstones cause a gallbladder attack or other symptoms, treatment may be needed. The most common treatment is surgery to remove the gallbladder. °This information is not intended to replace advice given to you by your health care provider. Make sure you discuss any questions you have with your health care provider. °Document Released: 01/20/2005 Document Revised: 10/11/2015 Document Reviewed: 10/11/2015 °[employer]vier Interactive Patient Education © 2018 [employer] ° °

## 2017-05-10 LAB — AMYLASE: AMYLASE: 57 U/L (ref 31–124)

## 2017-05-10 LAB — LIPASE: LIPASE: 29 U/L (ref 14–72)

## 2017-05-11 ENCOUNTER — Other Ambulatory Visit: Payer: Self-pay | Admitting: Obstetrics and Gynecology

## 2017-05-11 ENCOUNTER — Encounter: Payer: Self-pay | Admitting: Surgery

## 2017-05-11 ENCOUNTER — Ambulatory Visit (INDEPENDENT_AMBULATORY_CARE_PROVIDER_SITE_OTHER): Payer: Medicaid Other | Admitting: Surgery

## 2017-05-11 VITALS — BP 115/74 | HR 89 | Temp 97.8°F | Resp 14 | Ht 66.0 in | Wt 203.6 lb

## 2017-05-11 DIAGNOSIS — K8020 Calculus of gallbladder without cholecystitis without obstruction: Secondary | ICD-10-CM | POA: Diagnosis not present

## 2017-05-11 DIAGNOSIS — IMO0002 Reserved for concepts with insufficient information to code with codable children: Secondary | ICD-10-CM

## 2017-05-11 DIAGNOSIS — Z0489 Encounter for examination and observation for other specified reasons: Secondary | ICD-10-CM
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
## 2017-05-11 NOTE — Patient Instructions (Signed)
Avoid fatty foods and any foods that seem to make your symptoms worse.  Call to schedule a follow up here after delivery.     Cholelithiasis Cholelithiasis is also called "gallstones." It is a kind of gallbladder disease. The gallbladder is an organ that stores a liquid (bile) that helps you digest fat. Gallstones may not cause symptoms (may be silent gallstones) until they cause a blockage, and then they can cause pain (gallbladder attack). Follow these instructions at home:  Take over-the-counter and prescription medicines only as told by your doctor.  Stay at a healthy weight.  Eat healthy foods. This includes: ? Eating fewer fatty foods, like fried foods. ? Eating fewer refined carbs (refined carbohydrates). Refined carbs are breads and grains that are highly processed, like white bread and white rice. Instead, choose whole grains like whole-wheat bread and brown rice. ? Eating more fiber. Almonds, fresh fruit, and beans are healthy sources of fiber.  Keep all follow-up visits as told by your doctor. This is important. Contact a doctor if:  You have sudden pain in the upper right side of your belly (abdomen). Pain might spread to your right shoulder or your chest. This may be a sign of a gallbladder attack.  You feel sick to your stomach (are nauseous).  You throw up (vomit).  You have been diagnosed with gallstones that have no symptoms and you get: ? Belly pain. ? Discomfort, burning, or fullness in the upper part of your belly (indigestion). Get help right away if:  You have sudden pain in the upper right side of your belly, and it lasts for more than 2 hours.  You have belly pain that lasts for more than 5 hours.  You have a fever or chills.  You keep feeling sick to your stomach or you keep throwing up.  Your skin or the whites of your eyes turn yellow (jaundice).  You have dark-colored pee (urine).  You have light-colored poop  (stool). Summary  Cholelithiasis is also called "gallstones."  The gallbladder is an organ that stores a liquid (bile) that helps you digest fat.  Silent gallstones are gallstones that do not cause symptoms.  A gallbladder attack may cause sudden pain in the upper right side of your belly. Pain might spread to your right shoulder or your chest. If this happens, contact your doctor.  If you have sudden pain in the upper right side of your belly that lasts for more than 2 hours, get help right away. This information is not intended to replace advice given to you by your health care provider. Make sure you discuss any questions you have with your health care provider. Document Released: 07/13/2007 Document Revised: 10/11/2015 Document Reviewed: 10/11/2015 Elsevier Interactive Patient Education  2017 [employer]

## 2017-05-12 DIAGNOSIS — K8020 Calculus of gallbladder without cholecystitis without obstruction: Secondary | ICD-10-CM

## 2017-05-12 HISTORY — DX: Calculus of gallbladder without cholecystitis without obstruction: K80.20

## 2017-05-12 NOTE — Progress Notes (Signed)
Problem OB Visit: Patient presents today for further discussion of ultrasound results.  Patient with complaints of RUQ pain and projectile vomiting last week.  RUQ scan performed on 3/28 notes presence of gallstones. Patient notes nausea/vomiting has improved with Zofran. She does still note some intermittent pain in RUQ. Not taking anything. Advised on Tylenol ES for now.  Informed patient that as long as gallbladder is not inflamed, we can manage expectantly during the pregnancy and can f/u with General Surgery postpartum.  If pain continues or symptoms worsen, she may require surgery during her pregnancy.  Will order amylase/lipase today. Patient notes that she has been afebrile. Counseled on low-fat diet, avoiding greasy/spicy foods. Also s/p normal anatomy scan.  RTC in 2 weeks for routine OB visit. Will have patient establish care with General Surgeon to further discuss management of gallstones.   A total of 15 minutes were spent face-to-face with the patient during this encounter and over half of that time dealt with counseling and coordination of care.   Hildred Laserherry, , MD [facility]

## 2017-05-13 ENCOUNTER — Encounter: Payer: Self-pay | Admitting: Surgery

## 2017-05-24 ENCOUNTER — Ambulatory Visit (INDEPENDENT_AMBULATORY_CARE_PROVIDER_SITE_OTHER): Payer: Medicaid Other

## 2017-05-24 ENCOUNTER — Ambulatory Visit (INDEPENDENT_AMBULATORY_CARE_PROVIDER_SITE_OTHER): Payer: Medicaid Other | Admitting: Obstetrics and Gynecology

## 2017-05-24 VITALS — BP 105/69 | HR 85

## 2017-05-24 DIAGNOSIS — K8020 Calculus of gallbladder without cholecystitis without obstruction: Secondary | ICD-10-CM

## 2017-05-24 DIAGNOSIS — IMO0002 Reserved for concepts with insufficient information to code with codable children: Secondary | ICD-10-CM

## 2017-05-24 DIAGNOSIS — Z0489 Encounter for examination and observation for other specified reasons: Secondary | ICD-10-CM

## 2017-05-24 DIAGNOSIS — Z3482 Encounter for supervision of other normal pregnancy, second trimester: Secondary | ICD-10-CM

## 2017-05-24 LAB — POCT URINALYSIS DIPSTICK
Bilirubin, UA: NEGATIVE
Glucose, UA: NEGATIVE
KETONES UA: NEGATIVE
LEUKOCYTES UA: NEGATIVE
NITRITE UA: NEGATIVE
PROTEIN UA: NEGATIVE
RBC UA: NEGATIVE
SPEC GRAV UA: 1.01 (ref 1.010–1.025)
Urobilinogen, UA: 0.2 E.U./dL
pH, UA: 8 (ref 5.0–8.0)

## 2017-05-24 NOTE — Progress Notes (Signed)
ROB pt is doing well no concerns.  

## 2017-05-24 NOTE — Progress Notes (Signed)
ROB:  FAS today.-Patient managing gallstones well with diet.  Occasional nausea vomiting but not severe.  Had surgical consult.

## 2017-05-31 ENCOUNTER — Encounter: Payer: Medicaid Other | Admitting: Obstetrics and Gynecology

## 2017-06-08 ENCOUNTER — Encounter: Payer: Self-pay | Admitting: Obstetrics and Gynecology

## 2017-06-12 ENCOUNTER — Observation Stay
Admission: EM | Admit: 2017-06-12 | Discharge: 2017-06-12 | Disposition: A | Discharge status: Home / Self Care | Payer: PRIVATE HEALTH INSURANCE | Attending: Obstetrics and Gynecology | Admitting: Obstetrics and Gynecology

## 2017-06-12 ENCOUNTER — Other Ambulatory Visit: Payer: Self-pay

## 2017-06-12 DIAGNOSIS — O26892 Other specified pregnancy related conditions, second trimester: Principal | ICD-10-CM | POA: Insufficient documentation

## 2017-06-12 DIAGNOSIS — Z3490 Encounter for supervision of normal pregnancy, unspecified, unspecified trimester: Secondary | ICD-10-CM

## 2017-06-12 DIAGNOSIS — N898 Other specified noninflammatory disorders of vagina: Secondary | ICD-10-CM | POA: Insufficient documentation

## 2017-06-12 DIAGNOSIS — Z3A24 24 weeks gestation of pregnancy: Secondary | ICD-10-CM | POA: Diagnosis not present

## 2017-06-12 HISTORY — DX: Encounter for supervision of normal pregnancy, unspecified, unspecified trimester: Z34.90

## 2017-06-12 NOTE — Final Progress Note (Signed)
L&D OB Triage Note  Tiffany Andrews is a 22 y.o. G1P0 female at [redacted]w[redacted]d, EDD Estimated Date of Delivery: 10/02/17 who presented to triage for complaints of loss of mucous plug around 0030.  Was decribed as a cloudy white mucous discharge.  She also reports episode of vaginal bleeding 2 days ago which has since resolved. Patien thas been recently sexually active within the past 24 hrs. Denied contractions, LOF, and noted good fetal movement. She was evaluated by the nurses with no significant findings for preterm labor. Vital signs stable. An NST was performed and has been reviewed by MD.   NST INTERPRETATION: Indications: patient reassurance and rule out uterine contractions  Mode: External Baseline Rate (A): 135 bpm(FHT) Variability: Moderate Accelerations: 15 x 15 Decelerations: None     Contraction Frequency (min): None  Impression: reactive   Plan: NST performed was reviewed and was found to be reactive. She was discharged home with bleeding/preterm labor precautions.  Continue routine prenatal care. Follow up with OB/GYN as previously scheduled.     Hildred Laser, MD  [facility]

## 2017-06-12 NOTE — Discharge Instructions (Signed)
Please call or return to the Birthplace at [facility] with any of the following: Leaking of Fluid Vaginal Bleeding Decreased Fetal Movement Contractions every 3-29mins for an hour

## 2017-06-12 NOTE — Progress Notes (Signed)
Reviewed AVS/discharge instructions with patient. All questions answered. Pt ambulated with steady gait from unit, escorted home by significant other.

## 2017-06-12 NOTE — OB Triage Note (Signed)
Patient came in for observation for loss of mucous plug last night around 0030. Patient reports cloudy  mucous. Patient denies leaking of fluid and reports + FM. Patient denies uterine contractions at this time. Patient complains of leaking of fluid but denies vaginal bleeding and spotting. Vital signs stable and patient afebrile. FHR doppler 140 with moderate variability and no decelerations. Significant other at bedside. Will continue to monitor.

## 2017-06-21 ENCOUNTER — Ambulatory Visit (INDEPENDENT_AMBULATORY_CARE_PROVIDER_SITE_OTHER): Payer: Medicaid Other | Admitting: Obstetrics and Gynecology

## 2017-06-21 VITALS — BP 118/63 | HR 84 | Wt 209.4 lb

## 2017-06-21 DIAGNOSIS — Z113 Encounter for screening for infections with a predominantly sexual mode of transmission: Secondary | ICD-10-CM

## 2017-06-21 DIAGNOSIS — O926 Galactorrhea: Secondary | ICD-10-CM

## 2017-06-21 DIAGNOSIS — Z131 Encounter for screening for diabetes mellitus: Secondary | ICD-10-CM

## 2017-06-21 DIAGNOSIS — Z130 Encounter for screening for diseases of the blood and blood-forming organs and certain disorders involving the immune mechanism: Secondary | ICD-10-CM

## 2017-06-21 DIAGNOSIS — K8020 Calculus of gallbladder without cholecystitis without obstruction: Secondary | ICD-10-CM

## 2017-06-21 DIAGNOSIS — Z3402 Encounter for supervision of normal first pregnancy, second trimester: Secondary | ICD-10-CM

## 2017-06-21 NOTE — Progress Notes (Signed)
ROB-pt is still vomiting but is drinking water.

## 2017-06-21 NOTE — Patient Instructions (Signed)

## 2017-06-21 NOTE — Progress Notes (Signed)
ROB: Patient notes she is still having some occasional vomiting, but is able to keep more things down.  Is s/p General Surgery consultation for gallstones in pregnancy, recommend removal after postpartum period unless acute symptoms arise. Also given reassurance on normal aches/pains of pregnancy. Informed that galactorrhea was ok in pregnancy.  Continued to encourage modified dietary regimen. Also discussed nutritional supplements (Boost/Ensure) as patient notes her appetite has decreased. RTC in 4 weeks, for glucola and labs at that time.

## 2017-06-29 ENCOUNTER — Other Ambulatory Visit: Payer: Self-pay

## 2017-06-29 NOTE — Telephone Encounter (Signed)
Pt called in a spoke with pt concerning her being sick; vomiting and diaherria for 3 days. Called pt back and advised her to go to the ED or urgent care to be check to insure she was not dehydrated. Pt stated that she would go.

## 2017-06-30 ENCOUNTER — Observation Stay
Admission: EM | Admit: 2017-06-30 | Discharge: 2017-07-04 | Disposition: A | Discharge status: Other Acute Inpatient Hospital | Payer: Medicaid Other | Attending: Obstetrics and Gynecology | Admitting: Obstetrics and Gynecology

## 2017-06-30 DIAGNOSIS — K819 Cholecystitis, unspecified: Secondary | ICD-10-CM

## 2017-06-30 DIAGNOSIS — O99612 Diseases of the digestive system complicating pregnancy, second trimester: Secondary | ICD-10-CM | POA: Diagnosis not present

## 2017-06-30 DIAGNOSIS — R1011 Right upper quadrant pain: Secondary | ICD-10-CM | POA: Insufficient documentation

## 2017-06-30 DIAGNOSIS — K8020 Calculus of gallbladder without cholecystitis without obstruction: Secondary | ICD-10-CM | POA: Diagnosis present

## 2017-06-30 DIAGNOSIS — R112 Nausea with vomiting, unspecified: Secondary | ICD-10-CM | POA: Diagnosis not present

## 2017-06-30 DIAGNOSIS — K219 Gastro-esophageal reflux disease without esophagitis: Secondary | ICD-10-CM | POA: Diagnosis not present

## 2017-06-30 DIAGNOSIS — Z3A27 27 weeks gestation of pregnancy: Secondary | ICD-10-CM | POA: Diagnosis not present

## 2017-06-30 DIAGNOSIS — O26612 Liver and biliary tract disorders in pregnancy, second trimester: Secondary | ICD-10-CM | POA: Insufficient documentation

## 2017-06-30 DIAGNOSIS — O26892 Other specified pregnancy related conditions, second trimester: Secondary | ICD-10-CM | POA: Diagnosis not present

## 2017-06-30 LAB — CBC
HEMATOCRIT: 31.6 % — AB (ref 35.0–47.0)
Hemoglobin: 10.9 g/dL — ABNORMAL LOW (ref 12.0–16.0)
MCH: 29.8 pg (ref 26.0–34.0)
MCHC: 34.5 g/dL (ref 32.0–36.0)
MCV: 86.3 fL (ref 80.0–100.0)
Platelets: 235 10*3/uL (ref 150–440)
RBC: 3.66 MIL/uL — ABNORMAL LOW (ref 3.80–5.20)
RDW: 13.8 % (ref 11.5–14.5)
WBC: 9.4 10*3/uL (ref 3.6–11.0)

## 2017-06-30 LAB — COMPREHENSIVE METABOLIC PANEL
ALT: 13 U/L — AB (ref 14–54)
AST: 16 U/L (ref 15–41)
Albumin: 2.9 g/dL — ABNORMAL LOW (ref 3.5–5.0)
Alkaline Phosphatase: 62 U/L (ref 38–126)
Anion gap: 3 — ABNORMAL LOW (ref 5–15)
BILIRUBIN TOTAL: 0.4 mg/dL (ref 0.3–1.2)
BUN: 7 mg/dL (ref 6–20)
CHLORIDE: 107 mmol/L (ref 101–111)
CO2: 26 mmol/L (ref 22–32)
CREATININE: 0.47 mg/dL (ref 0.44–1.00)
Calcium: 8.5 mg/dL — ABNORMAL LOW (ref 8.9–10.3)
GFR calc Af Amer: >60 mL/min (ref >60)
Glucose, Bld: 91 mg/dL (ref 65–99)
Potassium: 4 mmol/L (ref 3.5–5.1)
Sodium: 136 mmol/L (ref 135–145)
TOTAL PROTEIN: 6.5 g/dL (ref 6.5–8.1)

## 2017-06-30 LAB — URINALYSIS, ROUTINE W REFLEX MICROSCOPIC
BACTERIA UA: NONE SEEN
Bilirubin Urine: NEGATIVE
Glucose, UA: NEGATIVE mg/dL
Hgb urine dipstick: NEGATIVE
Ketones, ur: NEGATIVE mg/dL
Nitrite: NEGATIVE
Protein, ur: NEGATIVE mg/dL
SPECIFIC GRAVITY, URINE: 1.018 (ref 1.005–1.030)
WBC, UA: >50 WBC/hpf — ABNORMAL HIGH (ref 0–5)
pH: 7 (ref 5.0–8.0)

## 2017-06-30 LAB — LIPASE, BLOOD: Lipase: 27 U/L (ref 11–51)

## 2017-06-30 MED ORDER — ONDANSETRON HCL 4 MG/2ML IJ SOLN
4.0000 mg | Freq: Four times a day (QID) | INTRAMUSCULAR | Status: DC | PRN
  Administered 2017-06-30 – 2017-07-02 (×5): 4 mg via INTRAVENOUS
  Filled 2017-06-30 (×4): qty 2

## 2017-06-30 MED ORDER — MORPHINE SULFATE (PF) 2 MG/ML IV SOLN
2.0000 mg | INTRAVENOUS | Status: DC | PRN
  Administered 2017-06-30 – 2017-07-04 (×7): 2 mg via INTRAVENOUS
  Filled 2017-06-30 (×7): qty 1

## 2017-06-30 MED ORDER — PROMETHAZINE HCL 25 MG PO TABS
25.0000 mg | ORAL_TABLET | Freq: Once | ORAL | Status: AC
  Administered 2017-06-30: 25 mg via ORAL
  Filled 2017-06-30: qty 1

## 2017-06-30 MED ORDER — ONDANSETRON HCL 4 MG/2ML IJ SOLN
INTRAMUSCULAR | Status: AC
  Administered 2017-06-30: 4 mg via INTRAVENOUS
  Filled 2017-06-30: qty 2

## 2017-06-30 MED ORDER — SODIUM CHLORIDE 0.9 % IV SOLN
12.5000 mg | Freq: Once | INTRAVENOUS | Status: AC
  Administered 2017-06-30: 12.5 mg via INTRAVENOUS
  Filled 2017-06-30: qty 0.5

## 2017-06-30 MED ORDER — DEXTROSE IN LACTATED RINGERS 5 % IV SOLN
INTRAVENOUS | Status: DC
  Administered 2017-06-30: 1000 mL via INTRAVENOUS
  Administered 2017-07-01 – 2017-07-04 (×9): via INTRAVENOUS
  Filled 2017-06-30: qty 1000

## 2017-06-30 NOTE — OB Triage Note (Signed)
Presents with complaint of nausea and vomiting for 4 days, states she has not been able to keep anything down . States she has thrown up "2or 3 times today" states she tried taking Phenergan that was prescribed but it just makes her go to sleep and then she just wakes up and throws up again.

## 2017-06-30 NOTE — Progress Notes (Signed)
Good improvement in symptoms, no further emesis, pain responded to morphine.  Will monitor overnight on antepartum, repeat labs in AM.  If does well after overnight hydration po challenge in AM and possible discharge
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
## 2017-07-01 DIAGNOSIS — Z3A27 27 weeks gestation of pregnancy: Secondary | ICD-10-CM | POA: Diagnosis not present

## 2017-07-01 DIAGNOSIS — R1011 Right upper quadrant pain: Secondary | ICD-10-CM | POA: Diagnosis not present

## 2017-07-01 DIAGNOSIS — O26892 Other specified pregnancy related conditions, second trimester: Secondary | ICD-10-CM | POA: Diagnosis not present

## 2017-07-01 DIAGNOSIS — R112 Nausea with vomiting, unspecified: Secondary | ICD-10-CM | POA: Diagnosis not present

## 2017-07-01 LAB — GLUCOSE, CAPILLARY
GLUCOSE-CAPILLARY: 72 mg/dL (ref 65–99)
Glucose-Capillary: 76 mg/dL (ref 65–99)

## 2017-07-01 LAB — MAGNESIUM: MAGNESIUM: 1.9 mg/dL (ref 1.7–2.4)

## 2017-07-01 MED ORDER — ACETAMINOPHEN 325 MG PO TABS
650.0000 mg | ORAL_TABLET | ORAL | Status: DC | PRN
  Administered 2017-07-01 – 2017-07-04 (×8): 650 mg via ORAL
  Filled 2017-07-01 (×9): qty 2

## 2017-07-01 MED ORDER — PROMETHAZINE HCL 25 MG PO TABS
25.0000 mg | ORAL_TABLET | Freq: Four times a day (QID) | ORAL | Status: DC | PRN
  Filled 2017-07-01: qty 1

## 2017-07-01 MED ORDER — SUCRALFATE 1 G PO TABS
1.0000 g | ORAL_TABLET | Freq: Three times a day (TID) | ORAL | Status: DC
  Administered 2017-07-01 – 2017-07-04 (×13): 1 g via ORAL
  Filled 2017-07-01 (×17): qty 1

## 2017-07-01 MED ORDER — DEXTROSE 50 % IV SOLN
25.0000 mL | Freq: Once | INTRAVENOUS | Status: AC
  Administered 2017-07-01: 25 mL via INTRAVENOUS
  Filled 2017-07-01: qty 50

## 2017-07-01 MED ORDER — THIAMINE HCL 100 MG/ML IJ SOLN
100.0000 mg | Freq: Every day | INTRAMUSCULAR | Status: DC
  Administered 2017-07-01: 100 mg via INTRAVENOUS
  Filled 2017-07-01 (×2): qty 1

## 2017-07-01 MED ORDER — SODIUM CHLORIDE 0.9 % IV SOLN
1.0000 g | Freq: Once | INTRAVENOUS | Status: AC
  Administered 2017-07-01: 1 g via INTRAVENOUS
  Filled 2017-07-01: qty 10

## 2017-07-01 NOTE — Progress Notes (Signed)
Patient ID: Tiffany Andrews, female   DOB: 01-30-1996, 22 y.o.   MRN: [id number]  CHIEF COMPLAINT: Nausea/vomiting  Subjective  Patient is feeling okay. Small improvement since yesterday. She vomited this morning after brushing her teeth. She reports zofran has taken the edge off of her nausea. She has tolerated ice chips today.   Objective   Examination:  General exam: Appears calm and comfortable  Respiratory system: Clear to auscultation. Respiratory effort normal. HEENT: [place]/AT, PERRLA, no thrush, no stridor. Cardiovascular system: S1 & S2 heard, RRR. No JVD, murmurs, rubs, gallops or clicks. No pedal edema. Gastrointestinal system: Abdomen is nondistended, soft and nontender. No organomegaly or masses felt. Normal bowel sounds heard. Central nervous system: Alert and oriented. No focal neurological deficits. Extremities: Symmetric 5 x 5 power. Skin: No rashes, lesions or ulcers Psychiatry: Judgement and insight appear normal. Mood & affect appropriate.   VITALS:  oral temperature is 98.3 F (36.8 C). Her blood pressure is 108/65 and her pulse is 67. Her respiration is 18 and oxygen saturation is 99%.   I personally reviewed Labs under Results section.  Radiology Reports No results found.    Assessment/Plan:  22 yo with hyperemesis and cholelithiasis 1. Supportive care with IV fluids. 2. GERD- will add sucralfate. 3. Advance diet as tolerated   Code Status: Full  Family Communication: Yes, significant other and mother in the room  Disposition Plan: Home   LOS: 0 days   Christanna R Schuman

## 2017-07-01 NOTE — Plan of Care (Signed)
Patient's vital signs stable; iv fluids continued; pain controlled with IV morphine; nausea controlled with zofran; patient remains NPO; voiding; sleeping at long intervals; husband asleep in recliner at bedside.

## 2017-07-02 DIAGNOSIS — O26892 Other specified pregnancy related conditions, second trimester: Secondary | ICD-10-CM | POA: Diagnosis not present

## 2017-07-02 LAB — ELECTROLYTE PANEL
ANION GAP: 3 — AB (ref 5–15)
CHLORIDE: 107 mmol/L (ref 101–111)
CO2: 26 mmol/L (ref 22–32)
POTASSIUM: 3.8 mmol/L (ref 3.5–5.1)
Sodium: 136 mmol/L (ref 135–145)

## 2017-07-02 LAB — BLOOD GAS, VENOUS
ACID-BASE EXCESS: 2.7 mmol/L — AB (ref 0.0–2.0)
BICARBONATE: 28.4 mmol/L — AB (ref 20.0–28.0)
Patient temperature: 37
pCO2, Ven: 48 mmHg (ref 44.0–60.0)
pH, Ven: 7.38 (ref 7.250–7.430)

## 2017-07-02 MED ORDER — THIAMINE HCL 100 MG/ML IJ SOLN
Freq: Once | INTRAVENOUS | Status: AC
  Administered 2017-07-02: 10:00:00 via INTRAVENOUS
  Filled 2017-07-02: qty 1000

## 2017-07-02 MED ORDER — METHYLPREDNISOLONE SODIUM SUCC 40 MG IJ SOLR
16.0000 mg | Freq: Three times a day (TID) | INTRAMUSCULAR | Status: DC
  Administered 2017-07-02 – 2017-07-03 (×4): 16 mg via INTRAVENOUS
  Filled 2017-07-02 (×5): qty 1

## 2017-07-02 MED ORDER — PROMETHAZINE HCL 25 MG PO TABS
25.0000 mg | ORAL_TABLET | Freq: Four times a day (QID) | ORAL | Status: DC
  Administered 2017-07-02 – 2017-07-04 (×11): 25 mg via ORAL
  Filled 2017-07-02 (×13): qty 1

## 2017-07-02 MED ORDER — ONDANSETRON HCL 4 MG/2ML IJ SOLN
4.0000 mg | Freq: Four times a day (QID) | INTRAMUSCULAR | Status: DC
  Administered 2017-07-02 – 2017-07-04 (×11): 4 mg via INTRAVENOUS
  Filled 2017-07-02 (×11): qty 2

## 2017-07-02 NOTE — Progress Notes (Signed)
Patient ID: Tiffany Andrews, female   DOB: 1995/06/20, 22 y.o.   MRN: [id number]  CHIEF COMPLAINT:  No chief complaint on file.   Subjective  Tiffany Andrews has not had improvement of her nausea or vomiting. She feels weak today and has a slight headache. She vomited several times overnight. She has not tolerated more than a few crackers and sips of ginger ale. She reports that for months at home she has been having nausea and vomiting related to hyperemesis. She say about 50% of what she eats she vomits.    Objective   Examination:  General exam: Appears calm and comfortable  Respiratory system: Clear to auscultation. Respiratory effort normal. HEENT: [place]/AT, PERRLA, no thrush, no stridor. Cardiovascular system: S1 & S2 heard, RRR. No JVD, murmurs, rubs, gallops or clicks. No pedal edema. Gastrointestinal system: Abdomen is nondistended, soft and nontender. No organomegaly or masses felt. Normal bowel sounds heard. Central nervous system: Alert and oriented. No focal neurological deficits. Extremities: Symmetric 5 x 5 power. Skin: No rashes, lesions or ulcers Psychiatry: Judgement and insight appear normal. Mood & affect appropriate.   VITALS:  oral temperature is 97.8 F (36.6 C). Her blood pressure is 107/67 and her pulse is 63. Her respiration is 18 and oxygen saturation is 100%.   I personally reviewed Labs under Results section.  Radiology Reports No results found.   Assessment/Plan:  22 yo with hospital day #3. 1.Nausea and vomiting, hyperemesis complicated by gallstones. No signs of cholecystitis. Continue IV zofran and prometheazine. Will start patient on IV steroids. Add banana bag of IVF once a day. Advance diet as tolerated.  2. Will check pH for metabolic disturbance. Her albumin is low which might be contributing to her low anion gap. 3. If no improvement in 24 hours obtain consult to dietary for consideration of enteral tube feeding.    LOS: 0 days    R    07/02/17 8:50 AM

## 2017-07-02 NOTE — Progress Notes (Signed)
Vitals WDL. Patient voiding adequately. Patient tolerated small amount of broth and pretzel sticks with cheese for lunch. Patient had no nausea or vomiting after lunch. Patient wants to eat regular diet for dinner. Diet advanced, per order. RN encouraged patient to start slow and go with bland foods. Patient verbalizes understanding.

## 2017-07-02 NOTE — Progress Notes (Addendum)
Patient vitals stable and WDL. No vomiting since 0140 this morning. Patient still having some nausea. Patient taking IV Zofran for nausea. Solu-medrol and phenergan also started this morning. Patient complains of intermittent headache. Patient states "it has come and gone ever since they gave me the morphine." Controlled with PO tylenol. Patient voiding adequately. Patient tolerated ginger ale and saltine crackers in the later part of the night. This morning, patient has tolerated a cup of jello, saltine crackers, ginger ale, and sips of water with no vomiting. FHT stable at 138. Patient to ambulate and shower later in the day. Patient to try to continue taking PO foods and fluids today.

## 2017-07-02 NOTE — Progress Notes (Addendum)
Patient tolerated 1.5 dinner rolls and a bowl of cereal with no nausea or vomiting so far.  0.9%NS with thiamine, folic acid, and multivitamin infusion stopped. Patient could not tolerate fluid in vein. Vein started to progressively get more irritated during infusion. RN slowed rate down to 66ml/hr but IV site still irritated and red after an hour or so. Patient received of bag before stopping fluid. IV site to be changed and patient to be started back on D5 in LR.

## 2017-07-03 ENCOUNTER — Other Ambulatory Visit: Payer: Self-pay

## 2017-07-03 DIAGNOSIS — O26892 Other specified pregnancy related conditions, second trimester: Secondary | ICD-10-CM | POA: Diagnosis not present

## 2017-07-03 DIAGNOSIS — R1011 Right upper quadrant pain: Secondary | ICD-10-CM | POA: Diagnosis not present

## 2017-07-03 DIAGNOSIS — Z3A27 27 weeks gestation of pregnancy: Secondary | ICD-10-CM | POA: Diagnosis not present

## 2017-07-03 DIAGNOSIS — R112 Nausea with vomiting, unspecified: Secondary | ICD-10-CM | POA: Diagnosis not present

## 2017-07-03 LAB — COMPREHENSIVE METABOLIC PANEL
ALK PHOS: 65 U/L (ref 38–126)
ALT: 11 U/L — AB (ref 14–54)
AST: 13 U/L — AB (ref 15–41)
Albumin: 2.8 g/dL — ABNORMAL LOW (ref 3.5–5.0)
Anion gap: 6 (ref 5–15)
BILIRUBIN TOTAL: 0.3 mg/dL (ref 0.3–1.2)
CALCIUM: 8.4 mg/dL — AB (ref 8.9–10.3)
CO2: 24 mmol/L (ref 22–32)
CREATININE: 0.48 mg/dL (ref 0.44–1.00)
Chloride: 107 mmol/L (ref 101–111)
GFR calc Af Amer: >60 mL/min (ref >60)
Glucose, Bld: 124 mg/dL — ABNORMAL HIGH (ref 65–99)
Potassium: 3.6 mmol/L (ref 3.5–5.1)
Sodium: 137 mmol/L (ref 135–145)
TOTAL PROTEIN: 6.6 g/dL (ref 6.5–8.1)

## 2017-07-03 LAB — CBC
HEMATOCRIT: 33.3 % — AB (ref 35.0–47.0)
Hemoglobin: 11.4 g/dL — ABNORMAL LOW (ref 12.0–16.0)
MCH: 29.8 pg (ref 26.0–34.0)
MCHC: 34.1 g/dL (ref 32.0–36.0)
MCV: 87.5 fL (ref 80.0–100.0)
Platelets: 226 10*3/uL (ref 150–440)
RBC: 3.81 MIL/uL (ref 3.80–5.20)
RDW: 13.6 % (ref 11.5–14.5)
WBC: 9.6 10*3/uL (ref 3.6–11.0)

## 2017-07-03 MED ORDER — PREDNISONE 20 MG PO TABS
20.0000 mg | ORAL_TABLET | Freq: Once | ORAL | Status: AC
  Administered 2017-07-03: 20 mg via ORAL
  Filled 2017-07-03 (×2): qty 1

## 2017-07-03 MED ORDER — PREDNISONE 2.5 MG PO TABS: 20.0000 mg | ORAL_TABLET | Freq: Every day | ORAL | Status: DC

## 2017-07-03 MED ORDER — DIPHENHYDRAMINE HCL 50 MG/ML IJ SOLN: 50.0000 mg | Freq: Four times a day (QID) | INTRAMUSCULAR | Status: DC | PRN

## 2017-07-03 MED ORDER — PREDNISONE 20 MG PO TABS
40.0000 mg | ORAL_TABLET | Freq: Every day | ORAL | Status: DC
  Administered 2017-07-04: 40 mg via ORAL
  Filled 2017-07-03 (×3): qty 2

## 2017-07-03 MED ORDER — PREDNISONE 2.5 MG PO TABS
40.0000 mg | ORAL_TABLET | Freq: Once | ORAL | Status: DC
  Filled 2017-07-03: qty 6

## 2017-07-03 NOTE — Plan of Care (Signed)
Vital signs stable; IV fluids continued; IV site clear; tolerating regular diet and po fluids; no nausea and/or vomitting this shift; voiding; husband at bedside and attentive; patient plans for discharge today if she can tolerate fluids/food after scheduled zofran and scheduled phenergan discontinued.

## 2017-07-03 NOTE — Progress Notes (Signed)
RN in room to assess pt and get FHT; pt sleeping; pt asked the significant other to call for RN when pt awake; he nodded "ok"

## 2017-07-03 NOTE — Progress Notes (Signed)
Patient complains of feeling hot, SOB, and "blurry vision", and face is reddened.  Dr. Logan Bores notified by phone.  MD order received to discontinue Solu-Medrol, and to give Prednisone today and tomorrow.  Order for PRN Benadryl received in case of swelling, but no swelling is present at this tim.e  Patient stated that this episode was resolved within 10 minutes. Reynold Bowen, RN 07/03/2017 7:49 PM

## 2017-07-03 NOTE — Progress Notes (Signed)
Patient ID: Analiese M Calia, female   DOB: 22-Nov-1995, 22 y.o.   MRN: [id number]     Subjective:    Feels much better.  Took PO yesterday evening and today.  Denies RUQ pain.  Objective:    Patient Vitals for the past 2 hrs:  BP Temp Temp src Pulse Resp SpO2  07/03/17 1222 (!) 95/48 98.1 F (36.7 C) Oral 84 18 100 %   Total I/O In: 1005.4 [P.O.:240; I.V.:765.4] Out: 700 [Urine:700]  Labs: Results for orders placed or performed during the hospital encounter of 06/30/17 (from the past 24 hour(s))  CBC     Status: Abnormal   Collection Time: 07/03/17  5:35 AM  Result Value Ref Range   WBC 9.6 3.6 - 11.0 K/uL   RBC 3.81 3.80 - 5.20 MIL/uL   Hemoglobin 11.4 (L) 12.0 - 16.0 g/dL   HCT 11.9 (L) 14.7 - 82.9 %   MCV 87.5 80.0 - 100.0 fL   MCH 29.8 26.0 - 34.0 pg   MCHC 34.1 32.0 - 36.0 g/dL   RDW 56.2 13.0 - 86.5 %   Platelets 226 150 - 440 K/uL  Comprehensive metabolic panel     Status: Abnormal   Collection Time: 07/03/17  5:35 AM  Result Value Ref Range   Sodium 137 135 - 145 mmol/L   Potassium 3.6 3.5 - 5.1 mmol/L   Chloride 107 101 - 111 mmol/L   CO2 24 22 - 32 mmol/L   Glucose, Bld 124 (H) 65 - 99 mg/dL   BUN <5 (L) 6 - 20 mg/dL   Creatinine, Ser 7.84 0.44 - 1.00 mg/dL   Calcium 8.4 (L) 8.9 - 10.3 mg/dL   Total Protein 6.6 6.5 - 8.1 g/dL   Albumin 2.8 (L) 3.5 - 5.0 g/dL   AST 13 (L) 15 - 41 U/L   ALT 11 (L) 14 - 54 U/L   Alkaline Phosphatase 65 38 - 126 U/L   Total Bilirubin 0.3 0.3 - 1.2 mg/dL   GFR calc non Af Amer >60 >60 mL/min   GFR calc Af Amer >60 >60 mL/min   Anion gap 6 5 - 15    Medications    Current Discharge Medication List    CONTINUE these medications which have NOT CHANGED   Details  Prenatal MV-Min-FA-Omega-3 (PRENATAL GUMMIES/DHA & FA PO) Take by mouth.    promethazine (PHENERGAN) 25 MG tablet Take 1 tablet (25 mg total) by mouth every 6 (six) hours as needed for nausea or vomiting. Qty: 20 tablet, Refills: 0          Assessment:     Hyperemesis with cholelithiasis.  No N/V in 12 hours.  No RUQ pain.  Plan:    Continue steroids for 48 hours total then discharge on tapering dose.  I have discussed this in detail with the patient.  Elonda Husky, M.D. 07/03/2017 1:02 PM

## 2017-07-04 ENCOUNTER — Observation Stay: Payer: Medicaid Other

## 2017-07-04 ENCOUNTER — Ambulatory Visit (HOSPITAL_COMMUNITY)
Admission: AD | Admit: 2017-07-04 | Discharge: 2017-07-04 | Disposition: A | Discharge status: Home / Self Care | Payer: Medicaid Other | Source: Other Acute Inpatient Hospital | Attending: Obstetrics and Gynecology | Admitting: Obstetrics and Gynecology

## 2017-07-04 DIAGNOSIS — K819 Cholecystitis, unspecified: Secondary | ICD-10-CM | POA: Diagnosis not present

## 2017-07-04 DIAGNOSIS — O26892 Other specified pregnancy related conditions, second trimester: Secondary | ICD-10-CM | POA: Diagnosis not present

## 2017-07-04 DIAGNOSIS — R112 Nausea with vomiting, unspecified: Secondary | ICD-10-CM

## 2017-07-04 DIAGNOSIS — K8080 Other cholelithiasis without obstruction: Secondary | ICD-10-CM | POA: Diagnosis present

## 2017-07-04 DIAGNOSIS — R1011 Right upper quadrant pain: Secondary | ICD-10-CM

## 2017-07-04 DIAGNOSIS — Z3A27 27 weeks gestation of pregnancy: Secondary | ICD-10-CM

## 2017-07-04 LAB — GLUCOSE, CAPILLARY
Glucose-Capillary: 65 mg/dL (ref 65–99)
Glucose-Capillary: 71 mg/dL (ref 65–99)

## 2017-07-04 MED ORDER — FAMOTIDINE 20 MG PO TABS
20.0000 mg | ORAL_TABLET | Freq: Two times a day (BID) | ORAL | Status: DC | PRN
  Administered 2017-07-04: 20 mg via ORAL
  Filled 2017-07-04: qty 1

## 2017-07-04 NOTE — Progress Notes (Signed)
At 1950:  RN called [facility] to set up transport and spoke to [place]; [employer] faxed to [employer]; estimated time of arrival to [facility] is between 2230-2300  At 2005:  RN called [employer] and spoke to [place]; she transferred the phone call to the nurses' unit; unit 5400  At 2006: RN spoke with the night charge nurse Aundra Millet on unit 5400 (labor and delivery) at [facility]; she informed me to call report to the unit at [facility] when the transport team has arrived to [facility]

## 2017-07-04 NOTE — Plan of Care (Signed)
Vs stable; up ad lib; taking tylenol for pain (headache this shift); FHT WNL; tolerating regular diet

## 2017-07-04 NOTE — Progress Notes (Addendum)
Patient ID: Tiffany Andrews, female   DOB: 10/18/1995, 22 y.o.   MRN: [id number]     Subjective:    Patient got her steroids yesterday and had a facial flushing shortness of breath reaction.  She feels fine today after changing her steroids to oral.  She understands that she will need to continue oral steroids and add a tapered dose. Of significant note patient began having persistent nausea and vomiting beginning at 5 AM which was accompanied by right upper quadrant pain.  She is now requesting definitive surgery for her gallbladder stating that she cannot take this continued pain and nausea and vomiting for the rest of her pregnancy.  Objective:    No data found. Total I/O In: 240 [P.O.:240] Out: -   Labs: No results found for this or any previous visit (from the past 24 hour(s)).  Medications    Current Discharge Medication List    CONTINUE these medications which have NOT CHANGED   Details  Prenatal MV-Min-FA-Omega-3 (PRENATAL GUMMIES/DHA & FA PO) Take by mouth.    promethazine (PHENERGAN) 25 MG tablet Take 1 tablet (25 mg total) by mouth every 6 (six) hours as needed for nausea or vomiting. Qty: 20 tablet, Refills: 0          Assessment:    Continued hyperemesis despite multiple antiemetics and steroids.  Cholelithiasis-symptomatic.  Plan:    Consult general surgery regarding possible cholecystectomy.  Elonda Husky, M.D. 07/04/2017 10:47 AM

## 2017-07-04 NOTE — Progress Notes (Signed)
Pt had one episode of emesis this shift around 0500

## 2017-07-04 NOTE — Progress Notes (Addendum)
Patient ID: Tiffany Andrews, female   DOB: 1995/03/06, 22 y.o.   MRN: [id number]   Pt has reaffirmed her desire for transfer for [facility] for cholecystectomy for gallstones RUQ pain and persistent N/V.  EGA = 27 weeks.   I have personally discussed the R/B for transfer with the patient.  The option of continued symptomatic care was also discussed and pt strongly desires surgery.   Discussed care with Dr. Leatha Gilding.  Important to note:   pt was given IV steroids for 24 hours and is now on a tapering PO dose.  These will likely need to be continued after her surgery.

## 2017-07-04 NOTE — Progress Notes (Signed)
Patient vitals stable. Voiding adequately. Patient complains of nausea with no emesis this morning (except around 0500). Patient tried to eat a biscuit this morning but it made her more nauseous. Patient complaining of RUQ pain around 6-9/10. Patient states pain may be causing more nausea. MD notified during rounds. Patient received morphine x2 and is taking tylenol for headaches. Patient to ultrasound at 1055.

## 2017-07-04 NOTE — Progress Notes (Signed)
At 2220:  [facility] [place]) called RN to update on time; arrival to [facility] from 2220 is 2-2.5 hours; RN to check with [facility] to see if there is availability of transport team to get pt to [facility]  At 2230:  RN contacted [facility] and spoke to [place] about availability of transport; Benna Dunks took pt information and said "I can send a truck"  At 2250:  Cindy from [facility] called to get report on patient; Arline Asp with [facility] will be here at [facility] in about "25-30 minutes"  At 2252:  RN called back to [employer] to update Jomarie Longs that [employer] can be at [facility] in about 30 minutes; Jomarie Longs to cancel the [facility] transporter

## 2017-07-04 NOTE — Progress Notes (Signed)
I Had a lengthy discussion with our anesthesiologist Dr. Maisie Fus about this specific case. We both agree that operating during the third trimester carries significant anesthesia and perioperative complications.  We both agree that she is better served at a tertiary facility where there is 24/7 capabilities from maternal-fetal medicine and neonatal ICU in case a complication will occur. I Do not think that this is a matter of performing a cholecystectomy but  a matter of having readily available advanced support ( Neonatal ICU and MF medicine) that this case may require I have discussed in detail with Dr. Logan Bores about my thought process and my reasoning.  He will arrange for transfer to a tertiary facility as recommended above
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
## 2017-07-05 ENCOUNTER — Encounter: Payer: Self-pay | Admitting: Obstetrics and Gynecology

## 2017-07-05 NOTE — Plan of Care (Signed)
Pt to be transferred to [facility] at 2340; pt has been nothing to eat since about 0800; only had sips of water to take meds; abdominal pain (gall bladder) is "good, when I don't eat, but it's so bad when I do eat solid food"; pt had u/s done 07-04-17 and showed gallstones; pt transferred to (hopefully) be able to have gallbladder removed; pt currently 27 weeks 1 day gestation; all forms printed for transport; pt signed emtala form

## 2017-07-05 NOTE — Progress Notes (Signed)
At this time, pt transferred via [facility] transport to [facility] for continued care

## 2017-07-05 NOTE — Progress Notes (Signed)
RN called [facility] to give report to the nurse; unit 5400; phone number [phone] was called and then RN was transferred to nurse [place]; RN gave [place] report via telephone

## 2017-07-05 NOTE — Progress Notes (Signed)
[facility] transport team arrived at 2319; patient signed emtala form at this time; patient records given to [place] from [employer]; VS and another FHT were taken at 2330; iv zofran given at 2330; family supportive and will meet patient at [facility]; pt's husband has directions and the unit the patient will be on; pt on stretcher and buckled in; pt leaving unit at 2340

## 2017-07-06 ENCOUNTER — Ambulatory Visit (INDEPENDENT_AMBULATORY_CARE_PROVIDER_SITE_OTHER): Payer: Medicaid Other | Admitting: Obstetrics and Gynecology

## 2017-07-06 VITALS — BP 90/62 | HR 78 | Wt 213.4 lb

## 2017-07-06 DIAGNOSIS — R112 Nausea with vomiting, unspecified: Secondary | ICD-10-CM

## 2017-07-06 DIAGNOSIS — O26612 Liver and biliary tract disorders in pregnancy, second trimester: Secondary | ICD-10-CM | POA: Diagnosis not present

## 2017-07-06 DIAGNOSIS — K8020 Calculus of gallbladder without cholecystitis without obstruction: Secondary | ICD-10-CM

## 2017-07-06 DIAGNOSIS — Z3402 Encounter for supervision of normal first pregnancy, second trimester: Secondary | ICD-10-CM

## 2017-07-06 LAB — POCT URINALYSIS DIPSTICK
BILIRUBIN UA: NEGATIVE
Blood, UA: NEGATIVE
Glucose, UA: NEGATIVE
NITRITE UA: NEGATIVE
PROTEIN UA: NEGATIVE
Spec Grav, UA: 1.01 (ref 1.010–1.025)
Urobilinogen, UA: 0.2 E.U./dL
pH, UA: 8 (ref 5.0–8.0)

## 2017-07-06 MED ORDER — DOCUSATE SODIUM 100 MG PO CAPS
100.00 | ORAL_CAPSULE | ORAL | Status: DC
Start: ? — End: 2017-07-06

## 2017-07-06 MED ORDER — POLYETHYLENE GLYCOL 3350 17 G PO PACK
17.00 | PACK | ORAL | Status: DC
Start: 2017-07-06 — End: 2017-07-06

## 2017-07-06 MED ORDER — ONDANSETRON HCL 4 MG/2ML IJ SOLN
4.00 | INTRAMUSCULAR | Status: DC
Start: ? — End: 2017-07-06

## 2017-07-06 MED ORDER — FAMOTIDINE 20 MG/2ML IV SOLN
20.00 | INTRAVENOUS | Status: DC
Start: 2017-07-05 — End: 2017-07-06

## 2017-07-06 MED ORDER — OXYCODONE HCL 5 MG PO TABS
5.00 | ORAL_TABLET | ORAL | Status: DC
Start: ? — End: 2017-07-06

## 2017-07-06 MED ORDER — MAGNESIUM HYDROXIDE 400 MG/5ML PO SUSP
15.00 | ORAL | Status: DC
Start: ? — End: 2017-07-06

## 2017-07-06 MED ORDER — GENERIC EXTERNAL MEDICATION
12.50 | Status: DC
Start: 2017-07-05 — End: 2017-07-06

## 2017-07-06 MED ORDER — LIDOCAINE HCL 1 % IJ SOLN
0.50 | INTRAMUSCULAR | Status: DC
Start: ? — End: 2017-07-06

## 2017-07-06 MED ORDER — DIPHENHYDRAMINE HCL 25 MG PO CAPS
25.00 | ORAL_CAPSULE | ORAL | Status: DC
Start: ? — End: 2017-07-06

## 2017-07-06 MED ORDER — ACETAMINOPHEN 325 MG PO TABS
975.00 | ORAL_TABLET | ORAL | Status: DC
Start: ? — End: 2017-07-06

## 2017-07-06 MED ORDER — PNV PRENATAL PLUS MULTIVITAMIN 27-1 MG PO TABS
1.00 | ORAL_TABLET | ORAL | Status: DC
Start: 2017-07-06 — End: 2017-07-06

## 2017-07-06 MED ORDER — METOCLOPRAMIDE HCL 5 MG/ML IJ SOLN
10.00 | INTRAMUSCULAR | Status: DC
Start: 2017-07-06 — End: 2017-07-06

## 2017-07-06 NOTE — Progress Notes (Signed)
ROB-pt is present today due to n/v since Friday, Jun 30, 2017. Pt was in the hospital since Friday and is still having n/v.

## 2017-07-09 MED ORDER — ONETOUCH ULTRA MINI W/DEVICE KIT: 1.0000 | PACK | Freq: Four times a day (QID) | 0 refills | Status: DC

## 2017-07-09 NOTE — Progress Notes (Signed)
Problem OB visit: Patient presents today after recent hospitalization for intractable nausea and vomiting. Patient was seen over the weekend for nausea/vomiting not relieved by Zofran.  Patient with h/o gallstones. Notes [facility] as she was told that they would perform surgery to remove her gallstones. Patient states that once she got there, they decided not to to perform the surgery and discontinued all of her medications and gave her meds that she had already had a reaction to at [facility].  Patient notes that she was very unhappy with her care there and requested to be discharged to follow up with her GYN.  Patient currently taking her Zofran (now up to 8 mg) and was given a prescription for Phenergan (which she has not yet picked up yet.  Also has not picked up her prescription for her steroids or her Pepcid.  Advised to get prescriptions, and alternate Phenergan and Zofran every 4 hours.   Also encouraged on BRAT diet.  As patient is practically at 3rd trimester, discussed that unfortunately she would likely not have her gallbladder removed until after pregnancy unless infection occurs. RTC in 1 week for routine OB appointment.  Will need alternative to 1 hr GCT as she is still not able to tolerate much PO. Will have patient have check blood sugars x 1 week.    A total of 15 minutes were spent face-to-face with the patient during this encounter and over half of that time dealt with counseling and coordination of care.   Hildred Laserherry, , MD [facility]

## 2017-07-11 ENCOUNTER — Encounter: Payer: Self-pay | Admitting: Obstetrics and Gynecology

## 2017-07-11 ENCOUNTER — Telehealth: Payer: Self-pay

## 2017-07-11 NOTE — Discharge Summary (Signed)
                              Discharge Summary  Date of Admission: 06/30/2017  Date of Discharge: 07/04/2017  Admitting Diagnosis: 27wk IUP - persistent N/V RUQ pain     Discharge Diagnosis: Same - transfer to [facility]   Procedures: Multiple antiemetics.  Steroids for control of N/V.  U/S                        Discharge Day SOAP Note:  Progress Note - See note from 5/28   Vital signs: BP 107/72 (BP Location: Left Arm)   Pulse 65   Temp 98.3 F (36.8 C) (Oral)   Resp 18   Ht 5\' 6"  (1.676 m)   Wt 209 lb (94.8 kg)   LMP 12/26/2016   SpO2 100%   BMI 33.73 kg/m     Data Review Labs: CBC Latest Ref Rng & Units 07/03/2017 06/30/2017 04/30/2017  WBC 3.6 - 11.0 K/uL 9.6 9.4 11.1(H)  Hemoglobin 12.0 - 16.0 g/dL 11.4(L) 10.9(L) 12.8  Hematocrit 35.0 - 47.0 % 33.3(L) 31.6(L) 37.1  Platelets 150 - 440 K/uL 226 235 241   A  Assessment/Plan  Active Problems:   Gallstone   Cholecystitis   Persistent N/V   RUQ pain  Plan for discharge. (transfer to [facility])  Allergies as of 07/04/2017      Reactions   Bee Venom    Shellfish Allergy       Medication List    TAKE these medications   PRENATAL GUMMIES/DHA & FA PO Take by mouth.   promethazine 25 MG tablet Commonly known as:  PHENERGAN Take 1 tablet (25 mg total) by mouth every 6 (six) hours as needed for nausea or vomiting.      Discharged Condition: fair  Discharged to: home and [place]uke      J. , M.D. 07/11/2017 3:50 PM

## 2017-07-11 NOTE — Telephone Encounter (Signed)
Pt was called to speak with her about her blood glucose monitor. Pt was informed that medicaid would not pay for her lancets and she would have to pay for them out of pocket. Pt was advised that she could go to [employer] and purchase a complete glucose kit with teething she needs for 19.98. Pt stated that she would go to [employer] and purchase that monitor.

## 2017-07-19 ENCOUNTER — Ambulatory Visit (INDEPENDENT_AMBULATORY_CARE_PROVIDER_SITE_OTHER): Payer: Medicaid Other | Admitting: Obstetrics and Gynecology

## 2017-07-19 ENCOUNTER — Encounter: Payer: Self-pay | Admitting: Obstetrics and Gynecology

## 2017-07-19 ENCOUNTER — Other Ambulatory Visit: Payer: Medicaid Other

## 2017-07-19 VITALS — BP 100/67 | HR 86 | Wt 211.0 lb

## 2017-07-19 DIAGNOSIS — Z113 Encounter for screening for infections with a predominantly sexual mode of transmission: Secondary | ICD-10-CM

## 2017-07-19 DIAGNOSIS — Z3403 Encounter for supervision of normal first pregnancy, third trimester: Secondary | ICD-10-CM | POA: Diagnosis not present

## 2017-07-19 DIAGNOSIS — O26613 Liver and biliary tract disorders in pregnancy, third trimester: Secondary | ICD-10-CM

## 2017-07-19 DIAGNOSIS — Z131 Encounter for screening for diabetes mellitus: Secondary | ICD-10-CM

## 2017-07-19 DIAGNOSIS — Z130 Encounter for screening for diseases of the blood and blood-forming organs and certain disorders involving the immune mechanism: Secondary | ICD-10-CM

## 2017-07-19 DIAGNOSIS — K829 Disease of gallbladder, unspecified: Secondary | ICD-10-CM

## 2017-07-19 LAB — POCT URINALYSIS DIPSTICK
BILIRUBIN UA: NEGATIVE
Glucose, UA: NEGATIVE
KETONES UA: NEGATIVE
NITRITE UA: NEGATIVE
Odor: NEGATIVE
PH UA: 6.5 (ref 5.0–8.0)
PROTEIN UA: NEGATIVE
RBC UA: NEGATIVE
Spec Grav, UA: 1.01 (ref 1.010–1.025)
UROBILINOGEN UA: 0.2 U/dL

## 2017-07-19 MED ORDER — TETANUS-DIPHTH-ACELL PERTUSSIS 5-2.5-18.5 LF-MCG/0.5 IM SUSP
0.5000 mL | Freq: Once | INTRAMUSCULAR | Status: AC
  Administered 2017-07-19: 0.5 mL via INTRAMUSCULAR

## 2017-07-19 NOTE — Patient Instructions (Signed)
Tdap Vaccine (Tetanus, Diphtheria and Pertussis): What You Need to Know 1. Why get vaccinated? Tetanus, diphtheria and pertussis are very serious diseases. Tdap vaccine can protect us from these diseases. And, Tdap vaccine given to pregnant women can protect newborn babies against pertussis. TETANUS (Lockjaw) is rare in the [place] today. It causes painful muscle tightening and stiffness, usually all over the body.  It can lead to tightening of muscles in the head and neck so you can't open your mouth, swallow, or sometimes even breathe. Tetanus kills about 1 out of 10 people who are infected even after receiving the best medical care.  DIPHTHERIA is also rare in the [place] today. It can cause a thick coating to form in the back of the throat.  It can lead to breathing problems, heart failure, paralysis, and death.  PERTUSSIS (Whooping Cough) causes severe coughing spells, which can cause difficulty breathing, vomiting and disturbed sleep.  It can also lead to weight loss, incontinence, and rib fractures. Up to 2 in 100 adolescents and 5 in 100 adults with pertussis are hospitalized or have complications, which could include pneumonia or death.  These diseases are caused by bacteria. Diphtheria and pertussis are spread from person to person through secretions from coughing or sneezing. Tetanus enters the body through cuts, scratches, or wounds. Before vaccines, as many as 200,000 cases of diphtheria, 200,000 cases of pertussis, and hundreds of cases of tetanus, were reported in the [place] each year. Since vaccination began, reports of cases for tetanus and diphtheria have dropped by about 99% and for pertussis by about 80%. 2. Tdap vaccine Tdap vaccine can protect adolescents and adults from tetanus, diphtheria, and pertussis. One dose of Tdap is routinely given at age 11 or 12. People who did not get Tdap at that age should get it as soon as possible. Tdap is especially  important for healthcare professionals and anyone having close contact with a baby younger than 12 months. Pregnant women should get a dose of Tdap during every pregnancy, to protect the newborn from pertussis. Infants are most at risk for severe, life-threatening complications from pertussis. Another vaccine, called Td, protects against tetanus and diphtheria, but not pertussis. A Td booster should be given every 10 years. Tdap may be given as one of these boosters if you have never gotten Tdap before. Tdap may also be given after a severe cut or burn to prevent tetanus infection. Your doctor or the person giving you the vaccine can give you more information. Tdap may safely be given at the same time as other vaccines. 3. Some people should not get this vaccine  A person who has ever had a life-threatening allergic reaction after a previous dose of any diphtheria, tetanus or pertussis containing vaccine, OR has a severe allergy to any part of this vaccine, should not get Tdap vaccine. Tell the person giving the vaccine about any severe allergies.  Anyone who had coma or long repeated seizures within 7 days after a childhood dose of DTP or DTaP, or a previous dose of Tdap, should not get Tdap, unless a cause other than the vaccine was found. They can still get Td.  Talk to your doctor if you: ? have seizures or another nervous system problem, ? had severe pain or swelling after any vaccine containing diphtheria, tetanus or pertussis, ? ever had a condition called Guillain-Barr Syndrome (GBS), ? aren't feeling well on the day the shot is scheduled. 4. Risks With any medicine, including   vaccines, there is a chance of side effects. These are usually mild and go away on their own. Serious reactions are also possible but are rare. Most people who get Tdap vaccine do not have any problems with it. Mild problems following Tdap: (Did not interfere with activities)  Pain where the shot was given (about  3 in 4 adolescents or 2 in 3 adults)  Redness or swelling where the shot was given (about 1 person in 5)  Mild fever of at least 100.4F (up to about 1 in 25 adolescents or 1 in 100 adults)  Headache (about 3 or 4 people in 10)  Tiredness (about 1 person in 3 or 4)  Nausea, vomiting, diarrhea, stomach ache (up to 1 in 4 adolescents or 1 in 10 adults)  Chills, sore joints (about 1 person in 10)  Body aches (about 1 person in 3 or 4)  Rash, swollen glands (uncommon)  Moderate problems following Tdap: (Interfered with activities, but did not require medical attention)  Pain where the shot was given (up to 1 in 5 or 6)  Redness or swelling where the shot was given (up to about 1 in 16 adolescents or 1 in 12 adults)  Fever over 102F (about 1 in 100 adolescents or 1 in 250 adults)  Headache (about 1 in 7 adolescents or 1 in 10 adults)  Nausea, vomiting, diarrhea, stomach ache (up to 1 or 3 people in 100)  Swelling of the entire arm where the shot was given (up to about 1 in 500).  Severe problems following Tdap: (Unable to perform usual activities; required medical attention)  Swelling, severe pain, bleeding and redness in the arm where the shot was given (rare).  Problems that could happen after any vaccine:  People sometimes faint after a medical procedure, including vaccination. Sitting or lying down for about 15 minutes can help prevent fainting, and injuries caused by a fall. Tell your doctor if you feel dizzy, or have vision changes or ringing in the ears.  Some people get severe pain in the shoulder and have difficulty moving the arm where a shot was given. This happens very rarely.  Any medication can cause a severe allergic reaction. Such reactions from a vaccine are very rare, estimated at fewer than 1 in a million doses, and would happen within a few minutes to a few hours after the vaccination. As with any medicine, there is a very remote chance of a vaccine  causing a serious injury or death. The safety of vaccines is always being monitored. For more information, visit: www.cdc.gov/vaccinesafety/ 5. What if there is a serious problem? What should I look for? Look for anything that concerns you, such as signs of a severe allergic reaction, very high fever, or unusual behavior. Signs of a severe allergic reaction can include hives, swelling of the face and throat, difficulty breathing, a fast heartbeat, dizziness, and weakness. These would usually start a few minutes to a few hours after the vaccination. What should I do?  If you think it is a severe allergic reaction or other emergency that can't wait, call 9-1-1 or get the person to the nearest hospital. Otherwise, call your doctor.  Afterward, the reaction should be reported to the Vaccine Adverse Event Reporting System (VAERS). Your doctor might file this report, or you can do it yourself through the VAERS web site at www.vaers.hhs.gov, or by calling 1-800-822-7967. ? VAERS does not give medical advice. 6. The National Vaccine Injury Compensation Program The National   Vaccine Injury Compensation Program (VICP) is a federal program that was created to compensate people who may have been injured by certain vaccines. Persons who believe they may have been injured by a vaccine can learn about the program and about filing a claim by calling 1-800-338-2382 or visiting the VICP website at www.hrsa.gov/vaccinecompensation. There is a time limit to file a claim for compensation. 7. How can I learn more?  Ask your doctor. He or she can give you the vaccine package insert or suggest other sources of information.  Call your local or state health department.  Contact the Centers for Disease Control and Prevention (CDC): ? Call 1-800-232-4636 (1-800-CDC-INFO) or ? Visit CDC's website at www.cdc.gov/vaccines CDC Tdap Vaccine VIS (04/02/13) This information is not intended to replace advice given to you by your  health care provider. Make sure you discuss any questions you have with your health care provider. Document Released: 07/26/2011 Document Revised: 10/15/2015 Document Reviewed: 10/15/2015 Elsevier Interactive Patient Education  2017 Elsevier Inc.  

## 2017-07-19 NOTE — Progress Notes (Signed)
ROB: Patient still has nausea and vomiting daily but is able to cope at this time.  Blood sugars for 1 week all normal-not gestational diabetic.  CBC today.

## 2017-07-19 NOTE — Progress Notes (Signed)
ROB- BTC and tdap.

## 2017-07-20 LAB — CBC
HEMOGLOBIN: 11.4 g/dL (ref 11.1–15.9)
Hematocrit: 34.1 % (ref 34.0–46.6)
MCH: 28.4 pg (ref 26.6–33.0)
MCHC: 33.4 g/dL (ref 31.5–35.7)
MCV: 85 fL (ref 79–97)
PLATELETS: 235 10*3/uL (ref 150–450)
RBC: 4.02 x10E6/uL (ref 3.77–5.28)
RDW: 13.7 % (ref 12.3–15.4)
WBC: 10.4 10*3/uL (ref 3.4–10.8)

## 2017-07-20 LAB — RPR: RPR Ser Ql: NONREACTIVE

## 2017-08-08 ENCOUNTER — Ambulatory Visit (INDEPENDENT_AMBULATORY_CARE_PROVIDER_SITE_OTHER): Payer: Medicaid Other | Admitting: Obstetrics and Gynecology

## 2017-08-08 ENCOUNTER — Other Ambulatory Visit: Payer: Self-pay

## 2017-08-08 VITALS — BP 93/66 | HR 92 | Wt 216.3 lb

## 2017-08-08 DIAGNOSIS — R51 Headache: Secondary | ICD-10-CM

## 2017-08-08 DIAGNOSIS — O2613 Low weight gain in pregnancy, third trimester: Secondary | ICD-10-CM

## 2017-08-08 DIAGNOSIS — Z3483 Encounter for supervision of other normal pregnancy, third trimester: Secondary | ICD-10-CM

## 2017-08-08 DIAGNOSIS — K8020 Calculus of gallbladder without cholecystitis without obstruction: Secondary | ICD-10-CM

## 2017-08-08 DIAGNOSIS — R12 Heartburn: Secondary | ICD-10-CM

## 2017-08-08 DIAGNOSIS — Z331 Pregnant state, incidental: Secondary | ICD-10-CM

## 2017-08-08 DIAGNOSIS — Z1389 Encounter for screening for other disorder: Secondary | ICD-10-CM

## 2017-08-08 DIAGNOSIS — R197 Diarrhea, unspecified: Secondary | ICD-10-CM

## 2017-08-08 DIAGNOSIS — O9989 Other specified diseases and conditions complicating pregnancy, childbirth and the puerperium: Secondary | ICD-10-CM

## 2017-08-08 HISTORY — DX: Low weight gain in pregnancy, third trimester: O26.13

## 2017-08-08 LAB — POCT URINALYSIS DIPSTICK
BILIRUBIN UA: NEGATIVE
GLUCOSE UA: NEGATIVE
Ketones, UA: NEGATIVE
Leukocytes, UA: NEGATIVE
Nitrite, UA: NEGATIVE
Protein, UA: POSITIVE — AB
RBC UA: NEGATIVE
Spec Grav, UA: 1.01 (ref 1.010–1.025)
Urobilinogen, UA: 0.2 E.U./dL
pH, UA: 8 (ref 5.0–8.0)

## 2017-08-08 MED ORDER — GI COCKTAIL ~~LOC~~: 30.0000 mL | Freq: Every day | ORAL | 4 refills | Status: DC | PRN

## 2017-08-08 MED ORDER — ONDANSETRON 4 MG PO TBDP: ORAL_TABLET | ORAL | 0 refills | Status: DC

## 2017-08-08 MED ORDER — PROMETHAZINE HCL 25 MG PO TABS: 25.0000 mg | ORAL_TABLET | Freq: Four times a day (QID) | ORAL | 0 refills | Status: DC | PRN

## 2017-08-08 NOTE — Progress Notes (Signed)
ROB-pt stating having heartburn, diarrhea, vomiting and headaches. Pt noticed seeing blood when she wipes after urine.

## 2017-08-08 NOTE — Progress Notes (Signed)
ROB: Notes continued heartburn, diarrhea, vomiting, and headaches. Sees blood when wiping after urination also with mucus. No blood on UA today. Given reassurance.  Refill given on phenergan and Zofran. Also advised on taking sucralfate as prescribed. Also given GI cocktail prescription if sucralfate is ineffective.  Advised on Kaopectate if diarrhea continues. Tylenol for headaches. Will order growth scan for poor weight gain in pregnancy. RTC in 2 weeks.

## 2017-08-11 ENCOUNTER — Telehealth: Payer: Self-pay | Admitting: Obstetrics and Gynecology

## 2017-08-11 ENCOUNTER — Ambulatory Visit (INDEPENDENT_AMBULATORY_CARE_PROVIDER_SITE_OTHER): Payer: Medicaid Other

## 2017-08-11 DIAGNOSIS — O2613 Low weight gain in pregnancy, third trimester: Secondary | ICD-10-CM

## 2017-08-11 DIAGNOSIS — Z3A32 32 weeks gestation of pregnancy: Secondary | ICD-10-CM

## 2017-08-11 NOTE — Telephone Encounter (Signed)
The patient called and stated that one of her prescriptions did not go through. The "GI Cocktail Drink" The patient would like a call back to confirm. Please advise.

## 2017-08-14 ENCOUNTER — Other Ambulatory Visit: Payer: Self-pay

## 2017-08-14 ENCOUNTER — Telehealth: Payer: Self-pay | Admitting: Obstetrics and Gynecology

## 2017-08-14 MED ORDER — GI COCKTAIL ~~LOC~~: 30.0000 mL | Freq: Every day | ORAL | 4 refills | Status: DC | PRN

## 2017-08-14 NOTE — Telephone Encounter (Signed)
Sp[facility] the [facility].

## 2017-08-14 NOTE — Telephone Encounter (Signed)
Spoke with pt and sent in her medication again via fax. Was unable to find confirmation of fax on pt chart. Advised pt if the medication is not at her pharmacy to please give the office a call.

## 2017-08-14 NOTE — Telephone Encounter (Signed)
Katie From [facility] in [place] called in regards to the patient top confirm a few things about the medication that was sent in for the patient. No other information was disclosed. Please advise.

## 2017-08-18 ENCOUNTER — Other Ambulatory Visit (INDEPENDENT_AMBULATORY_CARE_PROVIDER_SITE_OTHER): Payer: Medicaid Other

## 2017-08-18 ENCOUNTER — Encounter: Payer: Self-pay | Admitting: Obstetrics and Gynecology

## 2017-08-18 DIAGNOSIS — R300 Dysuria: Secondary | ICD-10-CM

## 2017-08-18 LAB — POCT URINALYSIS DIPSTICK
Bilirubin, UA: NEGATIVE
Blood, UA: NEGATIVE
GLUCOSE UA: NEGATIVE
LEUKOCYTES UA: NEGATIVE
Nitrite, UA: NEGATIVE
Protein, UA: POSITIVE — AB
SPEC GRAV UA: 1.01 (ref 1.010–1.025)
Urobilinogen, UA: 0.2 E.U./dL
pH, UA: 7.5 (ref 5.0–8.0)

## 2017-08-20 LAB — URINE CULTURE: Organism ID, Bacteria: NO GROWTH

## 2017-08-22 ENCOUNTER — Encounter: Payer: Self-pay | Admitting: Obstetrics and Gynecology

## 2017-08-22 ENCOUNTER — Ambulatory Visit (INDEPENDENT_AMBULATORY_CARE_PROVIDER_SITE_OTHER): Payer: Medicaid Other | Admitting: Obstetrics and Gynecology

## 2017-08-22 VITALS — BP 109/71 | HR 93 | Wt 215.8 lb

## 2017-08-22 DIAGNOSIS — Z3483 Encounter for supervision of other normal pregnancy, third trimester: Secondary | ICD-10-CM

## 2017-08-22 DIAGNOSIS — Z3A33 33 weeks gestation of pregnancy: Secondary | ICD-10-CM

## 2017-08-22 DIAGNOSIS — O321XX0 Maternal care for breech presentation, not applicable or unspecified: Secondary | ICD-10-CM

## 2017-08-22 NOTE — Telephone Encounter (Signed)
pharmacy was called and spoke with pharmacy concerning medication.

## 2017-08-22 NOTE — Progress Notes (Signed)
ROB: No complaints.  Breech - U/S f/u next visit.  Needs Cx's next visit.

## 2017-08-22 NOTE — Progress Notes (Signed)
Pt stated lower back pain on tr side

## 2017-08-23 ENCOUNTER — Other Ambulatory Visit: Payer: Self-pay | Admitting: Obstetrics and Gynecology

## 2017-08-23 DIAGNOSIS — O321XX0 Maternal care for breech presentation, not applicable or unspecified: Secondary | ICD-10-CM

## 2017-08-26 ENCOUNTER — Encounter: Payer: Self-pay | Admitting: Obstetrics and Gynecology

## 2017-08-27 ENCOUNTER — Other Ambulatory Visit: Payer: Self-pay | Admitting: Obstetrics and Gynecology

## 2017-08-28 ENCOUNTER — Other Ambulatory Visit: Payer: Self-pay | Admitting: *Deleted

## 2017-08-28 MED ORDER — FAMOTIDINE 20 MG PO TABS: ORAL_TABLET | ORAL | 2 refills | Status: DC

## 2017-08-29 ENCOUNTER — Telehealth: Payer: Self-pay | Admitting: Obstetrics and Gynecology

## 2017-08-29 ENCOUNTER — Other Ambulatory Visit: Payer: Self-pay

## 2017-08-29 ENCOUNTER — Observation Stay
Admission: EM | Admit: 2017-08-29 | Discharge: 2017-08-29 | Disposition: A | Discharge status: Home / Self Care | Payer: Medicaid Other | Attending: Obstetrics and Gynecology | Admitting: Obstetrics and Gynecology

## 2017-08-29 DIAGNOSIS — Z3A35 35 weeks gestation of pregnancy: Secondary | ICD-10-CM | POA: Insufficient documentation

## 2017-08-29 DIAGNOSIS — O4703 False labor before 37 completed weeks of gestation, third trimester: Secondary | ICD-10-CM | POA: Diagnosis present

## 2017-08-29 DIAGNOSIS — Z3490 Encounter for supervision of normal pregnancy, unspecified, unspecified trimester: Secondary | ICD-10-CM

## 2017-08-29 LAB — URINALYSIS, ROUTINE W REFLEX MICROSCOPIC
BACTERIA UA: NONE SEEN
Bilirubin Urine: NEGATIVE
GLUCOSE, UA: NEGATIVE mg/dL
Hgb urine dipstick: NEGATIVE
KETONES UR: NEGATIVE mg/dL
NITRITE: NEGATIVE
PH: 6 (ref 5.0–8.0)
PROTEIN: NEGATIVE mg/dL
Specific Gravity, Urine: 1.005 (ref 1.005–1.030)

## 2017-08-29 NOTE — Telephone Encounter (Signed)
The patient called and stated that she is having contractions she has had 10 so far 8 minutes apart. I spoke with Dr. Valentino Saxonherry and a nurse we advised her to go to L&D at the hospital. Please advise.

## 2017-08-29 NOTE — OB Triage Note (Signed)
Discharge instructions provided and reviewed.  Follow up card discussed.  Pt verbalizes understanding.

## 2017-08-29 NOTE — OB Triage Note (Signed)
Pt arrival to triage with c/o contractions q7-578min starting around 1330.  Pt states pain is 4/10 with contractions.  Pt denies vaginal bleeding, LOF, and is feeling baby move normally.  EFM and toco applied and assessing.

## 2017-08-30 NOTE — Telephone Encounter (Signed)
Error

## 2017-08-31 ENCOUNTER — Other Ambulatory Visit: Payer: Self-pay

## 2017-08-31 ENCOUNTER — Observation Stay
Admission: AD | Admit: 2017-08-31 | Discharge: 2017-08-31 | Disposition: A | Discharge status: Home / Self Care | Payer: Medicaid Other | Source: Ambulatory Visit | Attending: Obstetrics and Gynecology | Admitting: Obstetrics and Gynecology

## 2017-08-31 DIAGNOSIS — O26859 Spotting complicating pregnancy, unspecified trimester: Secondary | ICD-10-CM

## 2017-08-31 DIAGNOSIS — R109 Unspecified abdominal pain: Secondary | ICD-10-CM

## 2017-08-31 DIAGNOSIS — M549 Dorsalgia, unspecified: Secondary | ICD-10-CM

## 2017-08-31 DIAGNOSIS — O468X3 Other antepartum hemorrhage, third trimester: Principal | ICD-10-CM | POA: Insufficient documentation

## 2017-08-31 DIAGNOSIS — O9989 Other specified diseases and conditions complicating pregnancy, childbirth and the puerperium: Secondary | ICD-10-CM | POA: Diagnosis not present

## 2017-08-31 DIAGNOSIS — Z3A35 35 weeks gestation of pregnancy: Secondary | ICD-10-CM | POA: Diagnosis not present

## 2017-08-31 MED ORDER — ACETAMINOPHEN 325 MG PO TABS
650.0000 mg | ORAL_TABLET | ORAL | Status: DC | PRN
  Administered 2017-08-31: 650 mg via ORAL
  Filled 2017-08-31: qty 2

## 2017-08-31 MED ORDER — CALCIUM CARBONATE ANTACID 500 MG PO CHEW
1.0000 | CHEWABLE_TABLET | Freq: Once | ORAL | Status: AC | PRN
  Administered 2017-08-31: 200 mg via ORAL

## 2017-08-31 MED ORDER — CALCIUM CARBONATE ANTACID 500 MG PO CHEW
CHEWABLE_TABLET | ORAL | Status: AC
  Administered 2017-08-31: 200 mg via ORAL
  Filled 2017-08-31: qty 1

## 2017-08-31 MED ORDER — LACTATED RINGERS IV BOLUS
500.0000 mL | Freq: Once | INTRAVENOUS | Status: AC
  Administered 2017-08-31: 500 mL via INTRAVENOUS

## 2017-08-31 NOTE — OB Triage Note (Signed)
Patient arrived in triage with c/o vaginal bleeding noted with wiping after using the bathroom (x3 occurrences). Notes blood tinged mucous at first, then bright red spotting. Pt not wearing pad/ panty liner upon arrival. Reports "pressure" in upper abdomen where baby is located and contractions the "same as last night". Patient seen in triage 08/29/17 and had cervical exam at that time. Reports constant back ache that gets worse when she feels pressure in abdomen increase, about every 10 minutes. Denies leaking of fluid. Reports feeling fetal movement, but movements are decreased. EFM applied and assessing.

## 2017-08-31 NOTE — Discharge Summary (Signed)
    L&D OB Triage Note  SUBJECTIVE Tiffany Andrews is a 22 y.o. G1P0 female at 3467w3d, EDD Estimated Date of Delivery: 10/02/17 who presented to triage with complaints of contractions every 5 to 8 minutes..   OB History  Gravida Para Term Preterm AB Living  1 0 0 0 0 0  SAB TAB Ectopic Multiple Live Births  0 0 0 0 0    # Outcome Date GA Lbr Len/2nd Weight Sex Delivery Anes PTL Lv  1 Current             No medications prior to admission.     OBJECTIVE  Nursing Evaluation:   BP 119/76   Pulse 83   Temp 98.2 F (36.8 C) (Oral)   Resp 16   Ht 5\' 6"  (1.676 m)   Wt 215 lb (97.5 kg)   LMP 12/26/2016   BMI 34.70 kg/m    Findings:   Irreg contractions.      No cervical change for several hours  NST was performed and has been reviewed by me.  NST INTERPRETATION: Category I  Mode: External(monitors d/c'd for discharge) Baseline Rate (A): 120 bpm(fht) Variability: Moderate Accelerations: 10 x 10 Decelerations: None     Contraction Frequency (min): 3-5(with uterine irritability)  ASSESSMENT Impression:  1.  Pregnancy:  G1P0 at 3667w3d , EDD Estimated Date of Delivery: 10/02/17 2.  NST:  Category I  3.  Pt not in labor  PLAN 1. Reassurance given 2. Discharge home with standard labor precautions given to return to L&D or call the office for problems. 3. Continue routine prenatal care.

## 2017-08-31 NOTE — OB Triage Note (Signed)
Patient reports contractions have eased up. No bleeding noted on peripad. Scant amt of bleeding (2 pinpoint size dots) noted on toilet tissue after going to the bathroom. Patient reports pain is now down to "2/10". Discussed plan to discharge patient home and return for increase in contractions, leaking of fluid, vaginal bleeding, or decreased fetal movement. Patient verbalized understanding and agreed to plan.

## 2017-08-31 NOTE — Discharge Instructions (Signed)
Return to hospital for increase in painful contractions, leaking fluid, vaginal bleeding, or decreased fetal movement.

## 2017-09-03 NOTE — Discharge Summary (Signed)
L&D OB Triage Note  Tiffany Andrews is a 22 y.o. G1P0 female at 527w3d, EDD Estimated Date of Delivery: 10/02/17 who presented to triage for complaints of back pain, abdominal pressure,  and vaginal bleeding (mostly after wiping), with 3 episodes.  She noted good fetal movement and deied LOF. She was evaluated by the nurses with findings significant for contractions without active labor. Vital signs stable. An NST was performed and has been reviewed by MD. She was treated with IVF and Tylenol ES.   NST INTERPRETATION: Indications: vaginal bleeding and rule out uterine contractions  Mode: External(EFM d/c'd for discharge home) Baseline Rate (A): 115 bpm Variability: Moderate Accelerations: 15 x 15 Decelerations: None     Contraction Frequency (min): irritability (initally contractions ~ q 10 minutes) . Impression: reactive   Labs:  Results for orders placed or performed during the hospital encounter of 08/29/17  Urinalysis, Routine w reflex microscopic  Result Value Ref Range   Color, Urine STRAW (A) YELLOW   APPearance CLEAR (A) CLEAR   Specific Gravity, Urine 1.005 1.005 - 1.030   pH 6.0 5.0 - 8.0   Glucose, UA NEGATIVE NEGATIVE mg/dL   Hgb urine dipstick NEGATIVE NEGATIVE   Bilirubin Urine NEGATIVE NEGATIVE   Ketones, ur NEGATIVE NEGATIVE mg/dL   Protein, ur NEGATIVE NEGATIVE mg/dL   Nitrite NEGATIVE NEGATIVE   Leukocytes, UA TRACE (A) NEGATIVE   RBC / HPF 0-5 0 - 5 RBC/hpf   WBC, UA 6-10 0 - 5 WBC/hpf   Bacteria, UA NONE SEEN NONE SEEN   Squamous Epithelial / LPF 0-5 0 - 5     Plan: NST performed was reviewed and was found to be reactive. No evidence of UTI noted on exam. She was discharged home with bleeding/preterm labor precautions.  Continue routine prenatal care. Follow up with OB/GYN as previously scheduled.     Hildred LaserAnika , MD  [facility]

## 2017-09-06 ENCOUNTER — Ambulatory Visit (INDEPENDENT_AMBULATORY_CARE_PROVIDER_SITE_OTHER): Payer: Medicaid Other | Admitting: Obstetrics and Gynecology

## 2017-09-06 ENCOUNTER — Ambulatory Visit (INDEPENDENT_AMBULATORY_CARE_PROVIDER_SITE_OTHER): Payer: Medicaid Other

## 2017-09-06 VITALS — BP 115/76 | HR 96 | Wt 219.8 lb

## 2017-09-06 DIAGNOSIS — O321XX0 Maternal care for breech presentation, not applicable or unspecified: Secondary | ICD-10-CM

## 2017-09-06 DIAGNOSIS — Z3403 Encounter for supervision of normal first pregnancy, third trimester: Secondary | ICD-10-CM

## 2017-09-06 DIAGNOSIS — Z3A36 36 weeks gestation of pregnancy: Secondary | ICD-10-CM

## 2017-09-06 LAB — POCT URINALYSIS DIPSTICK
Bilirubin, UA: NEGATIVE
Blood, UA: NEGATIVE
GLUCOSE UA: NEGATIVE
KETONES UA: NEGATIVE
Nitrite, UA: NEGATIVE
Protein, UA: POSITIVE — AB
SPEC GRAV UA: 1.01 (ref 1.010–1.025)
Urobilinogen, UA: 0.2 E.U./dL
pH, UA: 7 (ref 5.0–8.0)

## 2017-09-06 NOTE — Progress Notes (Deleted)
ROB

## 2017-09-06 NOTE — Progress Notes (Signed)
ROB: Patient doing well, no major complaints. Ultrasound today for suspected breech presentation last visit, confirmed on today's visit. Discussion had with patient regarding options, including positions for spinning breech presentation, chiropractor, moxibustion, and ECV. Patient notes she will try the non-invasive approaches but is very hesitant about ECV. Discussed that if presentation does not change she will need a C-section at 39 weeks. Patient notes understanding. Will schedule for 8/23, patient's desired date. 36 week labs done today. RTC in 1 week.

## 2017-09-06 NOTE — Progress Notes (Signed)
ROB-pt stated that she noticed some spotting x 1 day. Pt stated having mild backaches and cycle cramps.

## 2017-09-06 NOTE — Patient Instructions (Signed)
Breech Birth What is a breech birth? A breech birth is when a baby is born with the buttocks or the feet first. Most babies are in a head down (vertex) position when they are born. There are three types of breech babies:  When the baby's buttocks are showing first in the birth canal (vagina) with the legs straight up and the feet at the baby's head (frank breech).  When the baby's buttocks shows first with the legs bent at the knees and the feet down near the buttocks (complete breech).  When one or both of the baby's feet are down below the buttocks (footling breech).  What are the risks of a breech birth? Having a breech birth increases the risk to your baby. A breech birth may cause the following:  Umbilical cord prolapse. This is when the umbilical cord is in front of the baby before or during labor. This can cause the cord to become pinched or compressed. This can reduce the flow of blood and oxygen to the baby.  The baby getting stuck in the birth canal, which can cause injury or, rarely, death.  Injury to the nerves in the shoulder, arm, and hand (brachial plexus injury) when delivered.  Your baby being born too early (prematurely).  An increased need for a cesarean delivery.  What increases the risk of having a breech baby? It is not known what causes your baby to be breech. However, risk factors that may increase your chances of having a breech baby include the following:  The mother having had several babies already.  The mother having twins or more.  The mother having a baby with certain congenital disabilities.  The mother going into labor early.  The mother having problems with her uterus, such as a tumor.  The mother having placenta problems (placenta previa) or too much or not enough fluid surrounding the baby (amniotic fluid).  How do I know if my baby is breech? There are no symptoms for you to know that your baby is breech. When you are close to your due date,  your health care provider can tell if your baby is breech by:  An abdominal or vaginal (pelvic) exam.  An ultrasound.  Your health care provider may also be able to tell that your baby is breech if your baby's heartbeat is heard above your belly button. What can be done if my baby is breech?  Your health care provider may try to turn the baby in your uterus. This is a procedure called external cephalic version (ECV). This is done by your health care provider. He or she will place both hands on your abdomen and gently and slowly turn the baby around. It is important to know that ECV can increase your chances of suddenly going into labor. If an ECV is done, it is done toward the end of a healthy pregnancy. The baby may remain in this position or he or she may turn back to the breech position. You and your health care provider will discuss if an ECV is recommended for you and your baby. How will I delivery my baby if my baby is breech? You and your health care provider will discuss the best way to deliver your baby. If your baby is breech, it is less likely that a vaginal delivery will be recommended due to the risks. Some breech babies may be delivered safely without a cesarean, while in other cases health care providers will recommend a cesarean delivery. This   information is not intended to replace advice given to you by your health care provider. Make sure you discuss any questions you have with your health care provider. Document Released: 03/17/2006 Document Revised: 01/11/2016 Document Reviewed: 11/28/2013 Elsevier Interactive Patient Education  2017 Elsevier Inc.  

## 2017-09-08 ENCOUNTER — Telehealth: Payer: Self-pay | Admitting: Obstetrics and Gynecology

## 2017-09-08 LAB — GC/CHLAMYDIA PROBE AMP
CHLAMYDIA, DNA PROBE: NEGATIVE
NEISSERIA GONORRHOEAE BY PCR: NEGATIVE

## 2017-09-08 LAB — STREP GP B NAA: Strep Gp B NAA: NEGATIVE

## 2017-09-08 NOTE — Telephone Encounter (Signed)
Pt was called back. Pt was asked about what she had ate yesterday. Pt stated that she a granola bar for breakfast, popsicle , peanut butter and jelly sandwich for lunch, pizza for dinner, and 4 cookies before bed. Pt was advise to eat a healthy meal throughout today and keep a check on her sugar levels.

## 2017-09-08 NOTE — Telephone Encounter (Signed)
Patient called stating her blood sugar dropped to 58 last night. She stated it usually is around 118-120. She also stated she wanted her c section changed from 8/23 to 8/21. Please Advise.

## 2017-09-13 ENCOUNTER — Ambulatory Visit (INDEPENDENT_AMBULATORY_CARE_PROVIDER_SITE_OTHER): Payer: Medicaid Other | Admitting: Obstetrics and Gynecology

## 2017-09-13 ENCOUNTER — Encounter: Payer: Self-pay | Admitting: Obstetrics and Gynecology

## 2017-09-13 VITALS — BP 99/66 | HR 85 | Wt 215.0 lb

## 2017-09-13 DIAGNOSIS — Z3403 Encounter for supervision of normal first pregnancy, third trimester: Secondary | ICD-10-CM

## 2017-09-13 DIAGNOSIS — R197 Diarrhea, unspecified: Secondary | ICD-10-CM

## 2017-09-13 DIAGNOSIS — R1110 Vomiting, unspecified: Secondary | ICD-10-CM

## 2017-09-13 LAB — POCT URINALYSIS DIPSTICK
Bilirubin, UA: NEGATIVE
Glucose, UA: NEGATIVE
Ketones, UA: 80
LEUKOCYTES UA: NEGATIVE
Nitrite, UA: NEGATIVE
PH UA: 6.5 (ref 5.0–8.0)
Protein, UA: NEGATIVE
RBC UA: NEGATIVE
Spec Grav, UA: 1.01 (ref 1.010–1.025)
UROBILINOGEN UA: 0.2 U/dL

## 2017-09-13 NOTE — Progress Notes (Signed)
Pt states she has been vomiting since Sunday evening. She is unable to keep anything down and believes she may be dehydrated. Pt is also concerned with sharp pains in her back that started this morning.

## 2017-09-13 NOTE — Progress Notes (Signed)
ROB: Patient complains of nausea and vomiting for 3 days.  Also complains of some diarrhea.  The diarrhea has resolved.  (She says she is not having the same pain and does not believe it is from her gallbladder.)  She has tried Zofran and Phenergan without success.  She says she is keeping some liquids and popsicles down but no food.  UA reveals no dehydration but ketones are present.  We have discussed continued fluids and a reattempt at use of Zofran.  If the patient remains sick over the next day I have asked her to go to the hospital or come to the office and we will initiate IVs.

## 2017-09-14 ENCOUNTER — Telehealth: Payer: Self-pay

## 2017-09-14 ENCOUNTER — Observation Stay
Admission: EM | Admit: 2017-09-14 | Discharge: 2017-09-14 | Disposition: A | Discharge status: Home / Self Care | Payer: Medicaid Other | Attending: Obstetrics and Gynecology | Admitting: Obstetrics and Gynecology

## 2017-09-14 DIAGNOSIS — Z3A37 37 weeks gestation of pregnancy: Secondary | ICD-10-CM | POA: Insufficient documentation

## 2017-09-14 DIAGNOSIS — O212 Late vomiting of pregnancy: Principal | ICD-10-CM | POA: Insufficient documentation

## 2017-09-14 HISTORY — DX: Mild hyperemesis gravidarum: O21.0

## 2017-09-14 LAB — COMPREHENSIVE METABOLIC PANEL
ALBUMIN: 3 g/dL — AB (ref 3.5–5.0)
ALT: 8 U/L (ref 0–44)
ANION GAP: 7 (ref 5–15)
AST: 14 U/L — AB (ref 15–41)
Alkaline Phosphatase: 125 U/L (ref 38–126)
BILIRUBIN TOTAL: 0.8 mg/dL (ref 0.3–1.2)
BUN: <5 mg/dL — ABNORMAL LOW (ref 6–20)
CO2: 23 mmol/L (ref 22–32)
Calcium: 8.6 mg/dL — ABNORMAL LOW (ref 8.9–10.3)
Chloride: 108 mmol/L (ref 98–111)
Creatinine, Ser: 0.58 mg/dL (ref 0.44–1.00)
GFR calc Af Amer: >60 mL/min (ref >60)
Glucose, Bld: 64 mg/dL — ABNORMAL LOW (ref 70–99)
POTASSIUM: 3.5 mmol/L (ref 3.5–5.1)
Sodium: 138 mmol/L (ref 135–145)
TOTAL PROTEIN: 6.8 g/dL (ref 6.5–8.1)

## 2017-09-14 LAB — URINALYSIS, ROUTINE W REFLEX MICROSCOPIC
BILIRUBIN URINE: NEGATIVE
Glucose, UA: NEGATIVE mg/dL
Hgb urine dipstick: NEGATIVE
KETONES UR: 20 mg/dL — AB
Nitrite: NEGATIVE
PH: 6 (ref 5.0–8.0)
Protein, ur: NEGATIVE mg/dL
Specific Gravity, Urine: 1.008 (ref 1.005–1.030)

## 2017-09-14 MED ORDER — PROMETHAZINE HCL 25 MG/ML IJ SOLN
25.0000 mg | Freq: Once | INTRAMUSCULAR | Status: AC | PRN
  Administered 2017-09-14: 25 mg via INTRAVENOUS
  Filled 2017-09-14: qty 1

## 2017-09-14 MED ORDER — SODIUM CHLORIDE FLUSH 0.9 % IV SOLN
INTRAVENOUS | Status: AC
  Administered 2017-09-14: 10 mL
  Filled 2017-09-14: qty 10

## 2017-09-14 MED ORDER — DEXTROSE IN LACTATED RINGERS 5 % IV SOLN
INTRAVENOUS | Status: DC
  Administered 2017-09-14: 20:00:00 via INTRAVENOUS

## 2017-09-14 MED ORDER — ALUM & MAG HYDROXIDE-SIMETH 200-200-20 MG/5ML PO SUSP
30.0000 mL | Freq: Once | ORAL | Status: AC | PRN
  Administered 2017-09-14: 30 mL via ORAL
  Filled 2017-09-14: qty 30

## 2017-09-14 MED ORDER — DEXTROSE 5 % IN LACTATED RINGERS IV BOLUS
1000.0000 mL | Freq: Once | INTRAVENOUS | Status: AC
  Administered 2017-09-14: 1000 mL via INTRAVENOUS

## 2017-09-14 NOTE — Telephone Encounter (Signed)
Pt was called to see how she was doing. Pt stated that at her visit with DJE yesterday she is still vomiting and is unable to keep any food down. Pt was advised to get some popsicles to help with adding some fluids to her body to kept from getting dehydrated. Pt stated that DJE informed her that if she continued to vomit to come back to the office tomorrow to be seen.

## 2017-09-14 NOTE — Telephone Encounter (Signed)
Pt states she is still vomiting. As per Dr Logan BoresEvans notes, I advised the pt to go to the hospital with the possibility of initiating IV fluids.

## 2017-09-14 NOTE — OB Triage Note (Signed)
Discussed plan of care with patient and family. Patient verbalize understanding and agreed to plan of care. Reports that nausea is "better". Discharge instructions given as per AVS. Patient discharged in stable condition accompanied by family members.

## 2017-09-14 NOTE — Discharge Instructions (Signed)
Return to labor and delivery for leaking of fluid, vaginal bleeding, decreased fetal movement, or painful contractions.

## 2017-09-15 NOTE — Discharge Summary (Signed)
    L&D OB Triage Note  SUBJECTIVE Tiffany Andrews is a 22 y.o. G1P0 female at 6920w4d, EDD Estimated Date of Delivery: 10/02/17 who presented to triage with complaints of persistent nausea and vomiting.  She says that she got no better since leaving the office yesterday.  States that she has had emesis 10 times in the last 24 hours. Denies right upper quadrant pain or symptoms of gallstones. Reports active fetal movement.  OB History  Gravida Para Term Preterm AB Living  1 0 0 0 0 0  SAB TAB Ectopic Multiple Live Births  0 0 0 0 0    # Outcome Date GA Lbr Len/2nd Weight Sex Delivery Anes PTL Lv  1 Current             No medications prior to admission.     OBJECTIVE  Nursing Evaluation:   BP 112/64 (BP Location: Right Arm)   Pulse 98   Temp 98.2 F (36.8 C) (Oral)   Resp 16   Ht 5\' 6"  (1.676 m)   Wt 97.5 kg   LMP 12/26/2016   BMI 34.70 kg/m    Findings:   Small ketones (fewer than the day before in the office) no evidence of dehydration based on specific gravity. Patient felt better after receiving IV fluids and IV Phenergan.  NST was performed and has been reviewed by me.  NST INTERPRETATION: Category I  Mode: External(EFM d/c'd for patient discharge home) Baseline Rate (A): 130 bpm Variability: Moderate Accelerations: 15 x 15 Decelerations: None     Contraction Frequency (min): irritability  ASSESSMENT Impression:  1.  Pregnancy:  G1P0 at 4520w4d , EDD Estimated Date of Delivery: 10/02/17 2.  NST:  Category I  PLAN 1. Reassurance given 2. Discharge home with standard labor precautions given to return to L&D or call the office for problems. 3. Continue routine prenatal care.

## 2017-09-20 ENCOUNTER — Ambulatory Visit (INDEPENDENT_AMBULATORY_CARE_PROVIDER_SITE_OTHER): Payer: Medicaid Other | Admitting: Obstetrics and Gynecology

## 2017-09-20 VITALS — BP 104/71 | HR 72 | Wt 217.4 lb

## 2017-09-20 DIAGNOSIS — J321 Chronic frontal sinusitis: Secondary | ICD-10-CM

## 2017-09-20 DIAGNOSIS — Z3483 Encounter for supervision of other normal pregnancy, third trimester: Secondary | ICD-10-CM

## 2017-09-20 DIAGNOSIS — O9989 Other specified diseases and conditions complicating pregnancy, childbirth and the puerperium: Secondary | ICD-10-CM

## 2017-09-20 LAB — POCT URINALYSIS DIPSTICK
BILIRUBIN UA: NEGATIVE
Blood, UA: NEGATIVE
Glucose, UA: NEGATIVE
KETONES UA: NEGATIVE
NITRITE UA: NEGATIVE
PH UA: 7 (ref 5.0–8.0)
PROTEIN UA: POSITIVE — AB
Spec Grav, UA: <=1.005 — AB (ref 1.010–1.025)
Urobilinogen, UA: 0.2 E.U./dL

## 2017-09-20 MED ORDER — AZITHROMYCIN 250 MG PO TABS: ORAL_TABLET | ORAL | 1 refills | Status: DC

## 2017-09-20 NOTE — Progress Notes (Signed)
ROB-pt stated that she is doing well. Pt stated she is having some pressure in the vaginal area. No other complaints.

## 2017-09-20 NOTE — Progress Notes (Signed)
ROB: Patient complains of vaginal pressure. Otherwise doing well. Scheduled for C-section next week for breech presentation. All questions answered. Patient notes that she has had a green nasal discharge and congestion for several weeks. Has been using OTC meds with no relief. Will prescribe Z-pack, also recommend nasal saline spray.   Hildred Laserherry, , MD [facility]

## 2017-09-20 NOTE — Patient Instructions (Signed)
Cesarean Delivery Cesarean birth, or cesarean delivery, is the surgical delivery of a baby through an incision in the abdomen and the uterus. This may be referred to as a C-section. This procedure may be scheduled ahead of time, or it may be done in an emergency situation. Tell a health care provider about:  Any allergies you have.  All medicines you are taking, including vitamins, herbs, eye drops, creams, and over-the-counter medicines.  Any problems you or family members have had with anesthetic medicines.  Any blood disorders you have.  Any surgeries you have had.  Any medical conditions you have.  Whether you or any members of your family have a history of deep vein thrombosis (DVT) or pulmonary embolism (PE). What are the risks? Generally, this is a safe procedure. However, problems may occur, including:  Infection.  Bleeding.  Allergic reactions to medicines.  Damage to other structures or organs.  Blood clots.  Injury to your baby.  What happens before the procedure?  Follow instructions from your health care provider about eating or drinking restrictions.  Follow instructions from your health care provider about bathing before your procedure to help reduce your risk of infection.  If you know that you are going to have a cesarean delivery, do not shave your pubic area. Shaving before the procedure may increase your risk of infection.  Ask your health care provider about: ? Changing or stopping your regular medicines. This is especially important if you are taking diabetes medicines or blood thinners. ? Your pain management plan. This is especially important if you plan to breastfeed your baby. ? How long you will be in the hospital after the procedure. ? Any concerns you may have about receiving blood products if you need them during the procedure. ? Cord blood banking, if you plan to collect your baby's umbilical cord blood.  You may also want to ask your  health care provider: ? Whether you will be able to hold or breastfeed your baby while you are still in the operating room. ? Whether your baby can stay with you immediately after the procedure and during your recovery. ? Whether a family member or a person of your choice can go with you into the operating room and stay with you during the procedure, immediately after the procedure, and during your recovery.  Plan to have someone drive you home when you are discharged from the hospital. What happens during the procedure?  Fetal monitors will be placed on your abdomen to monitor your heart rate and your baby's heart rate.  Depending on the reason for your cesarean delivery, you may have a physical exam or additional testing, such as an ultrasound.  An IV tube will be inserted into one of your veins.  You may have your blood or urine tested.  You will be given antibiotic medicine to help prevent infection.  You may be given a special warming gown to wear to keep your temperature stable.  Hair may be removed from your pubic area.  The skin of your pubic area and lower abdomen will be cleaned with a germ-killing solution (antiseptic).  A catheter may be inserted into your bladder through your urethra. This drains your urine during the procedure.  You may be given one or more of the following: ? A medicine to numb the area (local anesthetic). ? A medicine to make you fall asleep (general anesthetic). ? A medicine (regional anesthetic) that is injected into your back or through a small   thin tube placed in your back (spinal anesthetic or epidural anesthetic). This numbs everything below the injection site and allows you to stay awake during your procedure. If this makes you feel nauseous, tell your health care provider. Medicines will be available to help reduce any nausea you may feel.  An incision will be made in your abdomen, and then in your uterus.  If you are awake during your  procedure, you may feel tugging and pulling in your abdomen, but you should not feel pain. If you feel pain, tell your health care provider immediately.  Your baby will be removed from your uterus. You may feel more pressure or pushing while this happens.  Immediately after birth, your baby will be dried and kept warm. You may be able to hold and breastfeed your baby. The umbilical cord may be clamped and cut during this time.  Your placenta will be removed from your uterus.  Your incisions will be closed with stitches (sutures). Staples, skin glue, or adhesive strips may also be applied to the incision in your abdomen.  Bandages (dressings) will be placed over the incision in your abdomen. The procedure may vary among health care providers and hospitals. What happens after the procedure?  Your blood pressure, heart rate, breathing rate, and blood oxygen level will be monitored often until the medicines you were given have worn off.  You may continue to receive fluids and medicines through an IV tube.  You will have some pain. Medicines will be available to help control your pain.  To help prevent blood clots: ? You may be given medicines. ? You may have to wear compression stockings or devices. ? You will be encouraged to walk around when you are able.  Hospital staff will encourage and support bonding with your baby. Your hospital may allow you and your baby to stay in the same room (rooming in) during your hospital stay to encourage successful breastfeeding.  You may be encouraged to cough and breathe deeply often. This helps to prevent lung problems.  If you have a catheter draining your urine, it will be removed as soon as possible after your procedure. This information is not intended to replace advice given to you by your health care provider. Make sure you discuss any questions you have with your health care provider. Document Released: 01/24/2005 Document Revised: 07/02/2015  Document Reviewed: 11/04/2014 Elsevier Interactive Patient Education  2018 [employer]  

## 2017-09-26 ENCOUNTER — Encounter
Admission: RE | Admit: 2017-09-26 | Discharge: 2017-09-26 | Disposition: A | Discharge status: Home / Self Care | Payer: Medicaid Other | Source: Ambulatory Visit | Attending: Obstetrics and Gynecology | Admitting: Obstetrics and Gynecology

## 2017-09-26 ENCOUNTER — Other Ambulatory Visit: Payer: Self-pay

## 2017-09-26 HISTORY — DX: Gastro-esophageal reflux disease without esophagitis: K21.9

## 2017-09-26 LAB — CBC
HCT: 35.1 % (ref 35.0–47.0)
HEMOGLOBIN: 11.9 g/dL — AB (ref 12.0–16.0)
MCH: 27.7 pg (ref 26.0–34.0)
MCHC: 33.9 g/dL (ref 32.0–36.0)
MCV: 81.7 fL (ref 80.0–100.0)
Platelets: 224 10*3/uL (ref 150–440)
RBC: 4.3 MIL/uL (ref 3.80–5.20)
RDW: 14.3 % (ref 11.5–14.5)
WBC: 10.7 10*3/uL (ref 3.6–11.0)

## 2017-09-26 LAB — TYPE AND SCREEN
ABO/RH(D): A POS
Antibody Screen: NEGATIVE
Extend sample reason: UNDETERMINED

## 2017-09-26 LAB — RAPID HIV SCREEN (HIV 1/2 AB+AG)
HIV 1/2 Antibodies: NONREACTIVE
HIV-1 P24 ANTIGEN - HIV24: NONREACTIVE

## 2017-09-26 MED ORDER — CEFAZOLIN SODIUM-DEXTROSE 2-4 GM/100ML-% IV SOLN
2.0000 g | INTRAVENOUS | Status: AC
  Administered 2017-09-27: 2 g via INTRAVENOUS
  Filled 2017-09-26 (×2): qty 100

## 2017-09-26 NOTE — Patient Instructions (Signed)
Your procedure is scheduled on: 09/27/17 Report to [facility] AT 9:30 AM LOCATED ON THE 3RD FLOOR OF THE VISITOR'S ENTRANCE. [facility].  Remember: Instructions that are not followed completely may result in serious medical risk, up to and including death, or upon the discretion of your surgeon and anesthesiologist your surgery may need to be rescheduled.     _X__ 1. Do not eat food after midnight the night before your procedure.                 No gum chewing or hard candies. You may drink clear liquids up to 2 hours                 before you are scheduled to arrive for your surgery- DO not drink clear                 liquids within 2 hours of the start of your surgery.                 Clear Liquids include:  water, apple juice without pulp, clear carbohydrate                 drink such as Clearfast or Gatorade, Black Coffee or Tea (Do not add                 anything to coffee or tea).  __X__2.  On the morning of surgery brush your teeth with toothpaste and water, you                 may rinse your mouth with mouthwash if you wish.  Do not swallow any              toothpaste of mouthwash.     _X__ 3.  No Alcohol for 24 hours before or after surgery.   _X__ 4.  Do Not Smoke or use e-cigarettes For 24 Hours Prior to Your Surgery.                 Do not use any chewable tobacco products for at least 6 hours prior to                 surgery.  ____  5.  Bring all medications with you on the day of surgery if instructed.   __X__  6.  Notify your doctor if there is any change in your medical condition      (cold, fever, infections).     Do not wear jewelry, make-up, hairpins, clips or nail polish. Do not wear lotions, powders, or perfumes.  Do not shave 48 hours prior to surgery. Men may shave face and neck. Do not bring valuables to the hospital.    [facility] is not responsible for any belongings or valuables.  Contacts, dentures/partials or body piercings may not be worn into surgery.  Bring a case for your contacts, glasses or hearing aids, a denture cup will be supplied. Leave your suitcase in the car. After surgery it may be brought to your room. For patients admitted to the hospital, discharge time is determined by your treatment team.   Patients discharged the day of surgery will not be allowed to drive home.   Please read over the following fact sheets that you were given:   MRSA Information  __X__ Take these medicines the morning of surgery with A SIP OF WATER:    1. NONE  2.   3.   4.  5.  6.  ____ Fleet Enema (as directed)   __X__ Use CHG Soap/SAGE wipes as directed  ____ Use inhalers on the day of surgery  ____ Stop metformin/Janumet/Farxiga 2 days prior to surgery    ____ Take 1/2 of usual insulin dose the night before surgery. No insulin the morning          of surgery.   ____ Stop Blood Thinners Coumadin/Plavix/Xarelto/Pleta/Pradaxa/Eliquis/Effient/Aspirin  on   Or contact your Surgeon, Cardiologist or Medical Doctor regarding  ability to stop your blood thinners  __ __ Stop Anti-inflammatories 7 days before surgery such as Advil, Ibuprofen, Motrin,  BC or Goodies Powder, Naprosyn, Naproxen, Aleve, Aspirin    __X_ Stop all herbal supplements, fish oil or vitamin E until after surgery.    ____ Bring C-Pap to the hospital.

## 2017-09-27 ENCOUNTER — Encounter
Admission: RE | Disposition: A | Discharge status: Home / Self Care | Payer: Self-pay | Source: Home / Self Care | Attending: Obstetrics and Gynecology

## 2017-09-27 ENCOUNTER — Inpatient Hospital Stay: Payer: Medicaid Other | Admitting: Anesthesiology

## 2017-09-27 ENCOUNTER — Inpatient Hospital Stay
Admission: RE | Admit: 2017-09-27 | Discharge: 2017-09-30 | DRG: 787 | Disposition: A | Discharge status: Home / Self Care | Payer: Medicaid Other | Attending: Obstetrics and Gynecology | Admitting: Obstetrics and Gynecology

## 2017-09-27 DIAGNOSIS — O9962 Diseases of the digestive system complicating childbirth: Secondary | ICD-10-CM | POA: Diagnosis present

## 2017-09-27 DIAGNOSIS — O9081 Anemia of the puerperium: Secondary | ICD-10-CM | POA: Diagnosis not present

## 2017-09-27 DIAGNOSIS — O321XX0 Maternal care for breech presentation, not applicable or unspecified: Principal | ICD-10-CM

## 2017-09-27 DIAGNOSIS — Z98891 History of uterine scar from previous surgery: Secondary | ICD-10-CM

## 2017-09-27 DIAGNOSIS — O34219 Maternal care for unspecified type scar from previous cesarean delivery: Secondary | ICD-10-CM | POA: Diagnosis not present

## 2017-09-27 DIAGNOSIS — K219 Gastro-esophageal reflux disease without esophagitis: Secondary | ICD-10-CM | POA: Diagnosis present

## 2017-09-27 DIAGNOSIS — K8020 Calculus of gallbladder without cholecystitis without obstruction: Secondary | ICD-10-CM | POA: Diagnosis present

## 2017-09-27 DIAGNOSIS — O99214 Obesity complicating childbirth: Secondary | ICD-10-CM | POA: Diagnosis present

## 2017-09-27 DIAGNOSIS — Z3A39 39 weeks gestation of pregnancy: Secondary | ICD-10-CM

## 2017-09-27 DIAGNOSIS — D62 Acute posthemorrhagic anemia: Secondary | ICD-10-CM | POA: Diagnosis not present

## 2017-09-27 DIAGNOSIS — E669 Obesity, unspecified: Secondary | ICD-10-CM | POA: Diagnosis present

## 2017-09-27 DIAGNOSIS — E66811 Obesity, class 1: Secondary | ICD-10-CM | POA: Diagnosis present

## 2017-09-27 DIAGNOSIS — O2613 Low weight gain in pregnancy, third trimester: Secondary | ICD-10-CM | POA: Diagnosis present

## 2017-09-27 HISTORY — DX: Maternal care for breech presentation, not applicable or unspecified: O32.1XX0

## 2017-09-27 LAB — RPR: RPR Ser Ql: NONREACTIVE

## 2017-09-27 LAB — ABO/RH: ABO/RH(D): A POS

## 2017-09-27 SURGERY — Surgical Case
Anesthesia: Spinal | Wound class: Clean Contaminated

## 2017-09-27 MED ORDER — NALOXONE HCL 0.4 MG/ML IJ SOLN: 0.4000 mg | INTRAMUSCULAR | Status: DC | PRN

## 2017-09-27 MED ORDER — NALBUPHINE HCL 10 MG/ML IJ SOLN: 5.0000 mg | INTRAMUSCULAR | Status: DC | PRN

## 2017-09-27 MED ORDER — IBUPROFEN 600 MG PO TABS
600.0000 mg | ORAL_TABLET | Freq: Four times a day (QID) | ORAL | Status: DC
  Administered 2017-09-27 – 2017-09-30 (×11): 600 mg via ORAL
  Filled 2017-09-27 (×11): qty 1

## 2017-09-27 MED ORDER — ACETAMINOPHEN 325 MG PO TABS: 325.0000 mg | ORAL_TABLET | ORAL | Status: DC | PRN

## 2017-09-27 MED ORDER — DIPHENHYDRAMINE HCL 25 MG PO CAPS
25.0000 mg | ORAL_CAPSULE | Freq: Four times a day (QID) | ORAL | Status: DC | PRN
  Administered 2017-09-28: 25 mg via ORAL
  Filled 2017-09-27: qty 1

## 2017-09-27 MED ORDER — ZOLPIDEM TARTRATE 5 MG PO TABS: 5.0000 mg | ORAL_TABLET | Freq: Every evening | ORAL | Status: DC | PRN

## 2017-09-27 MED ORDER — KETOROLAC TROMETHAMINE 30 MG/ML IJ SOLN: 30.0000 mg | Freq: Once | INTRAMUSCULAR | Status: DC | PRN

## 2017-09-27 MED ORDER — SIMETHICONE 80 MG PO CHEW
80.0000 mg | CHEWABLE_TABLET | ORAL | Status: DC | PRN
  Administered 2017-09-28: 80 mg via ORAL
  Filled 2017-09-27: qty 1

## 2017-09-27 MED ORDER — SODIUM CHLORIDE 0.9% FLUSH: 3.0000 mL | INTRAVENOUS | Status: DC | PRN

## 2017-09-27 MED ORDER — PHENYLEPHRINE HCL 10 MG/ML IJ SOLN
INTRAMUSCULAR | Status: DC | PRN
  Administered 2017-09-27: 50 ug via INTRAVENOUS

## 2017-09-27 MED ORDER — LIDOCAINE 5 % EX PTCH
1.0000 | MEDICATED_PATCH | CUTANEOUS | Status: DC
  Filled 2017-09-27: qty 1

## 2017-09-27 MED ORDER — NALBUPHINE HCL 10 MG/ML IJ SOLN: 5.0000 mg | Freq: Once | INTRAMUSCULAR | Status: DC | PRN

## 2017-09-27 MED ORDER — SODIUM CHLORIDE FLUSH 0.9 % IV SOLN
INTRAVENOUS | Status: AC
  Filled 2017-09-27: qty 10

## 2017-09-27 MED ORDER — DIPHENHYDRAMINE HCL 50 MG/ML IJ SOLN: 12.5000 mg | INTRAMUSCULAR | Status: DC | PRN

## 2017-09-27 MED ORDER — MORPHINE SULFATE (PF) 0.5 MG/ML IJ SOLN
INTRAMUSCULAR | Status: DC | PRN
  Administered 2017-09-27: .2 mg via INTRATHECAL

## 2017-09-27 MED ORDER — MAGNESIUM HYDROXIDE 400 MG/5ML PO SUSP: 30.0000 mL | ORAL | Status: DC | PRN

## 2017-09-27 MED ORDER — MENTHOL 3 MG MT LOZG
1.0000 | LOZENGE | OROMUCOSAL | Status: DC | PRN
  Filled 2017-09-27: qty 9

## 2017-09-27 MED ORDER — MIDAZOLAM HCL 2 MG/2ML IJ SOLN
INTRAMUSCULAR | Status: AC
  Filled 2017-09-27: qty 2

## 2017-09-27 MED ORDER — OXYCODONE-ACETAMINOPHEN 5-325 MG PO TABS
2.0000 | ORAL_TABLET | ORAL | Status: DC | PRN
  Administered 2017-09-28 – 2017-09-30 (×8): 2 via ORAL
  Filled 2017-09-27 (×10): qty 2

## 2017-09-27 MED ORDER — WITCH HAZEL-GLYCERIN EX PADS: 1.0000 "application " | MEDICATED_PAD | CUTANEOUS | Status: DC | PRN

## 2017-09-27 MED ORDER — OXYTOCIN 40 UNITS IN LACTATED RINGERS INFUSION - SIMPLE MED
2.5000 [IU]/h | INTRAVENOUS | Status: AC
  Administered 2017-09-27: 2.5 [IU]/h via INTRAVENOUS
  Filled 2017-09-27 (×2): qty 1000

## 2017-09-27 MED ORDER — PROMETHAZINE HCL 25 MG/ML IJ SOLN
6.2500 mg | INTRAMUSCULAR | Status: DC | PRN
  Administered 2017-09-27: 6.25 mg via INTRAVENOUS

## 2017-09-27 MED ORDER — FERROUS SULFATE 325 (65 FE) MG PO TABS
325.0000 mg | ORAL_TABLET | Freq: Two times a day (BID) | ORAL | Status: DC
  Administered 2017-09-28 – 2017-09-30 (×5): 325 mg via ORAL
  Filled 2017-09-27 (×6): qty 1

## 2017-09-27 MED ORDER — SIMETHICONE 80 MG PO CHEW
80.0000 mg | CHEWABLE_TABLET | ORAL | Status: DC
  Administered 2017-09-27 – 2017-09-29 (×3): 80 mg via ORAL
  Filled 2017-09-27 (×3): qty 1

## 2017-09-27 MED ORDER — LACTATED RINGERS IV SOLN
INTRAVENOUS | Status: DC
  Administered 2017-09-28: 07:00:00 via INTRAVENOUS

## 2017-09-27 MED ORDER — DIPHENHYDRAMINE HCL 25 MG PO CAPS: 25.0000 mg | ORAL_CAPSULE | ORAL | Status: DC | PRN

## 2017-09-27 MED ORDER — OXYTOCIN 40 UNITS IN LACTATED RINGERS INFUSION - SIMPLE MED
INTRAVENOUS | Status: DC | PRN
  Administered 2017-09-27: 590 mL via INTRAVENOUS
  Administered 2017-09-27: 10 mL via INTRAVENOUS

## 2017-09-27 MED ORDER — ONDANSETRON HCL 4 MG/2ML IJ SOLN
4.0000 mg | Freq: Three times a day (TID) | INTRAMUSCULAR | Status: DC | PRN
  Administered 2017-09-27 – 2017-09-28 (×2): 4 mg via INTRAVENOUS
  Filled 2017-09-27 (×2): qty 2

## 2017-09-27 MED ORDER — MEPERIDINE HCL 50 MG/ML IJ SOLN: 6.2500 mg | INTRAMUSCULAR | Status: DC | PRN

## 2017-09-27 MED ORDER — SCOPOLAMINE 1 MG/3DAYS TD PT72
1.0000 | MEDICATED_PATCH | Freq: Once | TRANSDERMAL | Status: AC
  Administered 2017-09-27: 1.5 mg via TRANSDERMAL
  Filled 2017-09-27: qty 1

## 2017-09-27 MED ORDER — SENNOSIDES-DOCUSATE SODIUM 8.6-50 MG PO TABS
2.0000 | ORAL_TABLET | ORAL | Status: DC
  Administered 2017-09-27 – 2017-09-29 (×3): 2 via ORAL
  Filled 2017-09-27 (×3): qty 2

## 2017-09-27 MED ORDER — BUPIVACAINE IN DEXTROSE 0.75-8.25 % IT SOLN
INTRATHECAL | Status: DC | PRN
  Administered 2017-09-27: 1.6 mL via INTRATHECAL

## 2017-09-27 MED ORDER — OXYCODONE-ACETAMINOPHEN 5-325 MG PO TABS
1.0000 | ORAL_TABLET | ORAL | Status: DC | PRN
  Filled 2017-09-27: qty 1

## 2017-09-27 MED ORDER — DIBUCAINE 1 % RE OINT: 1.0000 "application " | TOPICAL_OINTMENT | RECTAL | Status: DC | PRN

## 2017-09-27 MED ORDER — LIDOCAINE 5 % EX PTCH
1.0000 | MEDICATED_PATCH | CUTANEOUS | Status: DC
  Administered 2017-09-29: 1 via TRANSDERMAL
  Filled 2017-09-27: qty 1

## 2017-09-27 MED ORDER — PROPOFOL 10 MG/ML IV BOLUS
INTRAVENOUS | Status: AC
  Filled 2017-09-27: qty 20

## 2017-09-27 MED ORDER — SOD CITRATE-CITRIC ACID 500-334 MG/5ML PO SOLN
30.0000 mL | ORAL | Status: AC
  Administered 2017-09-27: 30 mL via ORAL
  Filled 2017-09-27: qty 15

## 2017-09-27 MED ORDER — NALOXONE HCL 4 MG/10ML IJ SOLN
1.0000 ug/kg/h | INTRAVENOUS | Status: DC | PRN
  Filled 2017-09-27: qty 5

## 2017-09-27 MED ORDER — LACTATED RINGERS IV SOLN
INTRAVENOUS | Status: DC
  Administered 2017-09-27: 11:00:00 via INTRAVENOUS

## 2017-09-27 MED ORDER — MIDAZOLAM HCL 2 MG/2ML IJ SOLN
INTRAMUSCULAR | Status: DC | PRN
  Administered 2017-09-27 (×3): 1 mg via INTRAVENOUS

## 2017-09-27 MED ORDER — PRENATAL MULTIVITAMIN CH
1.0000 | ORAL_TABLET | Freq: Every day | ORAL | Status: DC
  Administered 2017-09-28 – 2017-09-29 (×2): 1 via ORAL
  Filled 2017-09-27 (×3): qty 1

## 2017-09-27 MED ORDER — LIDOCAINE 5 % EX PTCH
MEDICATED_PATCH | CUTANEOUS | Status: DC | PRN
  Administered 2017-09-27: 1 via TRANSDERMAL

## 2017-09-27 MED ORDER — ACETAMINOPHEN 325 MG PO TABS
650.0000 mg | ORAL_TABLET | ORAL | Status: DC | PRN
  Administered 2017-09-27 – 2017-09-29 (×5): 650 mg via ORAL
  Filled 2017-09-27 (×5): qty 2

## 2017-09-27 MED ORDER — LACTATED RINGERS IV SOLN
Freq: Once | INTRAVENOUS | Status: AC
  Administered 2017-09-27: 10:00:00 via INTRAVENOUS

## 2017-09-27 MED ORDER — MORPHINE SULFATE (PF) 0.5 MG/ML IJ SOLN
INTRAMUSCULAR | Status: AC
  Filled 2017-09-27: qty 10

## 2017-09-27 MED ORDER — SODIUM CHLORIDE 0.9 % IV SOLN
INTRAVENOUS | Status: DC | PRN
  Administered 2017-09-27: 50 ug/min via INTRAVENOUS

## 2017-09-27 MED ORDER — PROMETHAZINE HCL 25 MG/ML IJ SOLN
INTRAMUSCULAR | Status: AC
  Administered 2017-09-27: 6.25 mg via INTRAVENOUS
  Filled 2017-09-27: qty 1

## 2017-09-27 MED ORDER — ACETAMINOPHEN 160 MG/5ML PO SOLN
325.0000 mg | ORAL | Status: DC | PRN
  Filled 2017-09-27: qty 20.3

## 2017-09-27 MED ORDER — OXYTOCIN 40 UNITS IN LACTATED RINGERS INFUSION - SIMPLE MED
INTRAVENOUS | Status: AC
  Filled 2017-09-27: qty 1000

## 2017-09-27 MED ORDER — COCONUT OIL OIL: 1.0000 "application " | TOPICAL_OIL | Status: DC | PRN

## 2017-09-27 SURGICAL SUPPLY — 30 items
BAG COUNTER SPONGE EZ (MISCELLANEOUS) ×2 IMPLANT
CANISTER SUCT 3000ML PPV (MISCELLANEOUS) ×3 IMPLANT
CHLORAPREP W/TINT 26ML (MISCELLANEOUS) ×6 IMPLANT
CLOSURE WOUND 1/2 X4 (GAUZE/BANDAGES/DRESSINGS) ×1
COUNTER SPONGE BAG EZ (MISCELLANEOUS) ×1
DRSG TELFA 3X8 NADH (GAUZE/BANDAGES/DRESSINGS) ×3 IMPLANT
ELECT REM PT RETURN 9FT ADLT (ELECTROSURGICAL) ×3
ELECTRODE REM PT RTRN 9FT ADLT (ELECTROSURGICAL) ×1 IMPLANT
GAUZE SPONGE 4X4 12PLY STRL (GAUZE/BANDAGES/DRESSINGS) ×3 IMPLANT
GLOVE BIO SURGEON STRL SZ 6.5 (GLOVE) ×4 IMPLANT
GLOVE BIO SURGEONS STRL SZ 6.5 (GLOVE) ×2
GLOVE BIOGEL PI ORTHO PRO 7.5 (GLOVE) ×2
GLOVE INDICATOR 7.0 STRL GRN (GLOVE) ×6 IMPLANT
GLOVE PI ORTHO PRO STRL 7.5 (GLOVE) ×1 IMPLANT
GOWN STRL REUS W/ TWL LRG LVL3 (GOWN DISPOSABLE) ×2 IMPLANT
GOWN STRL REUS W/ TWL XL LVL3 (GOWN DISPOSABLE) ×1 IMPLANT
GOWN STRL REUS W/TWL LRG LVL3 (GOWN DISPOSABLE) ×4
GOWN STRL REUS W/TWL XL LVL3 (GOWN DISPOSABLE) ×2
KIT TURNOVER KIT A (KITS) ×3 IMPLANT
NS IRRIG 1000ML POUR BTL (IV SOLUTION) ×3 IMPLANT
PACK C SECTION AR (MISCELLANEOUS) ×3 IMPLANT
PAD OB MATERNITY 4.3X12.25 (PERSONAL CARE ITEMS) ×3 IMPLANT
PAD PREP 24X41 OB/GYN DISP (PERSONAL CARE ITEMS) ×3 IMPLANT
STRIP CLOSURE SKIN 1/2X4 (GAUZE/BANDAGES/DRESSINGS) ×2 IMPLANT
SUT MNCRL AB 4-0 PS2 18 (SUTURE) ×3 IMPLANT
SUT PLAIN 2 0 XLH (SUTURE) IMPLANT
SUT VIC AB 0 CT1 36 (SUTURE) ×9 IMPLANT
SUT VIC AB 3-0 SH 27 (SUTURE) ×2
SUT VIC AB 3-0 SH 27X BRD (SUTURE) ×1 IMPLANT
SWABSTK COMLB BENZOIN TINCTURE (MISCELLANEOUS) ×3 IMPLANT

## 2017-09-27 NOTE — Anesthesia Postprocedure Evaluation (Signed)
Anesthesia Post Note  Patient: Tiffine M Offield  Procedure(s) Performed: PRIMARY CESAREAN SECTION (N/A )  Patient location during evaluation: L&D Anesthesia Type: Spinal Level of consciousness: oriented and awake and alert Pain management: pain level controlled Vital Signs Assessment: post-procedure vital signs reviewed and stable Respiratory status: spontaneous breathing, respiratory function stable and patient connected to nasal cannula oxygen Cardiovascular status: blood pressure returned to baseline and stable Postop Assessment: no headache, no backache and no apparent nausea or vomiting Anesthetic complications: no     Last Vitals:  Vitals:   09/27/17 1403 09/27/17 1451  BP: 105/70 109/65  Pulse: 67 (!) 48  Resp: 19 14  Temp: (!) 36.2 C   SpO2: 100% 100%    Last Pain:  Vitals:   09/27/17 1403  PainSc: 3                   Garry Heater 

## 2017-09-27 NOTE — Anesthesia Procedure Notes (Signed)
Spinal  Start time: 09/27/2017 12:07 PM End time: 09/27/2017 12:10 PM Staffing Anesthesiologist: Christia ReadingHowell, Scott T, MD Resident/CRNA: Irving BurtonBachich, , CRNA Performed: resident/CRNA  Preanesthetic Checklist Completed: patient identified, site marked, surgical consent, pre-op evaluation, IV checked, risks and benefits discussed and monitors and equipment checked Spinal Block Patient position: sitting Prep: ChloraPrep Patient monitoring: heart rate, continuous pulse ox and blood pressure Approach: midline Location: L3-4 Injection technique: single-shot Needle Needle type: Pencan  Needle gauge: 24 G

## 2017-09-27 NOTE — Transfer of Care (Signed)
Immediate Anesthesia Transfer of Care Note  Patient: Tiffany Andrews  Procedure(s) Performed: PRIMARY CESAREAN SECTION (N/A )  Patient Location: PACU  Anesthesia Type:Spinal  Level of Consciousness: awake, alert , oriented and patient cooperative  Airway & Oxygen Therapy: Patient Spontanous Breathing  Post-op Assessment: Report given to RN and Post -op Vital signs reviewed and stable  Post vital signs: Reviewed and stable  Last Vitals:  Vitals Value Taken Time  BP 103/57 09/27/2017  1:19 PM  Temp    Pulse 79 09/27/2017  1:21 PM  Resp 14 09/27/2017  1:21 PM  SpO2 99 % 09/27/2017  1:21 PM  Vitals shown include unvalidated device data.  Last Pain: There were no vitals filed for this visit.       Complications: No apparent anesthesia complications
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
## 2017-09-27 NOTE — Op Note (Signed)
Cesarean Section Procedure Note  Indications: malpresentation: frank breech, declined version  Pre-operative Diagnosis: 39 week 2 day pregnancy.  Post-operative Diagnosis: same  Surgeon: Hildred Laser , MD  Assistants: Brennan Baileyavid Evans, MD. No other capable assistant available in surgery requiring high level assistant.  Procedure: Primary low transverse Cesarean Section  Anesthesia: Spinal anesthesia  Procedure Details: The patient was seen in the Holding Room. The risks, benefits, complications, treatment options, and expected outcomes were discussed with the patient.  The patient concurred with the proposed plan, giving informed consent.  The site of surgery properly noted/marked. The patient was taken to the Operating Room, identified as Tiffany Andrews and the procedure verified as C-Section Delivery. A Time Out was held and the above information confirmed.  After induction of anesthesia, the patient was draped and prepped in the usual sterile manner. Anesthesia was tested and noted to be adequate. A Pfannenstiel incision was made and carried down through the subcutaneous tissue to the fascia. Fascial incision was made and extended transversely. The fascia was separated from the underlying rectus tissue superiorly and inferiorly. The peritoneum was identified and entered. Peritoneal incision was extended longitudinally. The surgical assist was able to provide retraction to allow for clear visualization of surgical site. The utero-vesical peritoneal reflection was incised transversely and the bladder flap was bluntly freed from the lower uterine segment. A low transverse uterine incision was made. Delivered from frank breech presentation was a 3900 gram Female with Apgar scores of 8 at one minute and 9 at five minutes.  The assistant was able to apply adequate fundal pressure to allow for successful delivery of the fetus. After the umbilical cord was clamped and cut cord blood was obtained for evaluation.  The placenta was removed intact and appeared normal. The uterus was exteriorized and cleared of all clots and debris. The uterine outline, tubes and ovaries appeared normal.  The uterine incision was closed with running locked sutures of 0-Vicryl.  A second suture of 0-Vicryl was used in an imbricating layer.  Hemostasis was observed. Lavage was carried out until clear. The fascia was then reapproximated with a running suture of 1-0 Vicryl. The subcutaneous fat layer was reapproximated with 3-0 Vicryl. skin was reapproximated with 4-0 Monocryl.  Instrument, sponge, and needle counts were correct prior the abdominal closure and at the conclusion of the case.   Findings: Female infant, cephalic presentation, 3900 grams, with Apgar scores of 8 at one minute and 9 at five minutes. Clear amniotic fluid Intact placenta with 3 vessel cord.  The uterine outline, tubes and ovaries appeared normal.   Estimated Blood Loss:  500 ml      Drains: foley catheter to gravity drainage, 100 ml clear urine at end of the procedure         Total IV Fluids:  1100 ml  Specimens: None         Implants: None         Complications:  None; patient tolerated the procedure well.         Disposition: PACU - hemodynamically stable.         Condition: stable   Hildred Laserherry, , MD [facility]

## 2017-09-27 NOTE — Anesthesia Post-op Follow-up Note (Signed)
Anesthesia QCDR form completed.        
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
## 2017-09-28 ENCOUNTER — Encounter: Payer: Self-pay | Admitting: Obstetrics and Gynecology

## 2017-09-28 LAB — CBC
HCT: 28.1 % — ABNORMAL LOW (ref 35.0–47.0)
HEMOGLOBIN: 9.5 g/dL — AB (ref 12.0–16.0)
MCH: 27.9 pg (ref 26.0–34.0)
MCHC: 33.8 g/dL (ref 32.0–36.0)
MCV: 82.4 fL (ref 80.0–100.0)
PLATELETS: 174 10*3/uL (ref 150–440)
RBC: 3.41 MIL/uL — AB (ref 3.80–5.20)
RDW: 14.5 % (ref 11.5–14.5)
WBC: 9.1 10*3/uL (ref 3.6–11.0)

## 2017-09-28 NOTE — Lactation Note (Signed)
This note was copied from a baby's chart. Lactation Consultation Note  Patient Name: Tiffany Andrews Today's Date: 09/28/2017     Maternal Data    Feeding Feeding Type: Bottle Fed - Formula Nipple Type: Slow - flow  LATCH Score                   Interventions    Lactation Tools Discussed/Used     Consult Status  LC to room to talk to mother and ask about her decision on the method in which she wants to feed infant. Mother states that she has chosen to formula feed because the positioning of breastfeeding has been a challenge and she just wants to recover from the cesarean. Mother states that breastfeeding would just be too much for her right now. LC reviewed bottle feeding techniques, the use of paced bottle feeding, and avoidance of overfeeding along with the sign and symptoms of overfeeding. Mother verbalized understanding of teaching.    Tiffany Andrews  09/28/2017, 2:18 PM

## 2017-09-29 NOTE — Progress Notes (Addendum)
Postpartum Day # 1: Cesarean Delivery  Subjective: Patient reports nausea, tolerating PO and + flatus.  Has not yet ambulated or voided with out catheter.   Objective: Vital signs in last 24 hours: Temp:  [97.9 F (36.6 C)-98.4 F (36.9 C)] 98.4 F (36.9 C) (08/23 0811) Pulse Rate:  [60-78] 78 (08/23 0811) Resp:  [18] 18 (08/23 0811) BP: (97-115)/(56-72) 113/66 (08/23 0811) SpO2:  [98 %-100 %] 98 % (08/23 0811)  Physical Exam:  General: alert and no distress Lungs: clear to auscultation bilaterally Breasts: normal appearance, no masses or tenderness Heart: regular rate and rhythm, S1, S2 normal, no murmur, click, rub or gallop Abdomen: soft, non-tender; bowel sounds normal; no masses,  no organomegaly Pelvis: Lochia appropriate, Uterine Fundus firm, Incision: healing well, no significant drainage, no dehiscence, no significant erythema Extremities: DVT Evaluation: No evidence of DVT seen on physical exam. Negative Homan's sign. No cords or calf tenderness. No significant calf/ankle edema.  Recent Labs    09/26/17 1409 09/28/17 0558  HGB 11.9* 9.5*  HCT 35.1 28.1*    Assessment/Plan: Status post Cesarean section. Doing well postoperatively.  Breastfeeding and bottle feeding, Lactation consult Circumcision after discharge  Contraception Nexplanon Advance diet as tolerated. Zofran as needed for nausea. Continue PO pain management Discontinue IVF when tolerating regular diet.  Mild anemia in pregnancy, worsened by surgical blood loss. Asymptomatic. Will treat with daily PO iron.  Continue current care.  Hildred LaserAnika , MD [facility]

## 2017-09-29 NOTE — Progress Notes (Signed)
Postpartum Day # 2: Cesarean Delivery  Subjective: Patient reports tolerating PO, + flatus, + BM and no problems voiding.  Is ambulating without difficulty. Notes nausea has improved.   Objective: Vitals:   09/28/17 2010 09/29/17 0012 09/29/17 0409 09/29/17 0811  BP: 115/66 (!) 97/56 109/72 113/66  Pulse: 68 69 75 78  Resp:  18 18 18   Temp: 98.1 F (36.7 C) 97.9 F (36.6 C) 98 F (36.7 C) 98.4 F (36.9 C)  TempSrc: Oral Oral Oral   SpO2: 99% 100% 100% 98%     Physical Exam:  General: alert and no distress Lungs: clear to auscultation bilaterally Breasts: normal appearance, no masses or tenderness Heart: regular rate and rhythm, S1, S2 normal, no murmur, click, rub or gallop Abdomen: soft, non-tender; bowel sounds normal; no masses,  no organomegaly Pelvis: Lochia appropriate, Uterine Fundus firm, Incision: healing well, no significant drainage, no dehiscence, no significant erythema Extremities: DVT Evaluation: No evidence of DVT seen on physical exam. Negative Homan's sign. No cords or calf tenderness. No significant calf/ankle edema.  Recent Labs    09/26/17 1409 09/28/17 0558  HGB 11.9* 9.5*  HCT 35.1 28.1*    Assessment/Plan: Status post Cesarean section. Doing well postoperatively.  Breastfeeding and bottle feeding, s/p Lactation consult Circumcision after discharge  Contraception Nexplanon Regular diet. Continue PO pain management Mild anemia in pregnancy, worsened by surgical blood loss. Asymptomatic. Will treat with daily PO iron.  Continue current care. Will d/c home tomorrow.   Hildred LaserAnika , MD [facility]

## 2017-09-30 ENCOUNTER — Encounter: Payer: Self-pay | Admitting: Obstetrics and Gynecology

## 2017-09-30 MED ORDER — OXYCODONE-ACETAMINOPHEN 5-325 MG PO TABS: 1.0000 | ORAL_TABLET | Freq: Four times a day (QID) | ORAL | 0 refills | Status: DC | PRN

## 2017-09-30 MED ORDER — ONDANSETRON HCL 4 MG PO TABS
4.0000 mg | ORAL_TABLET | Freq: Three times a day (TID) | ORAL | Status: DC | PRN
  Administered 2017-09-30: 4 mg via ORAL
  Filled 2017-09-30: qty 1

## 2017-09-30 MED ORDER — DOCUSATE SODIUM 100 MG PO CAPS: 100.0000 mg | ORAL_CAPSULE | Freq: Every day | ORAL | 1 refills | Status: DC | PRN

## 2017-09-30 MED ORDER — IBUPROFEN 800 MG PO TABS: 800.0000 mg | ORAL_TABLET | Freq: Three times a day (TID) | ORAL | 1 refills | Status: DC | PRN

## 2017-09-30 NOTE — Progress Notes (Signed)
Discharge instructions given. Patient verbalizes understanding of teaching. Patient discharged home via wheelchair at 1400. 

## 2017-09-30 NOTE — Discharge Summary (Signed)
OB Discharge Summary     Patient Name: Tiffany Andrews DOB: 13-Jan-1996 MRN: [id number]  Date of admission: 09/27/2017 Delivering MD: Hildred LaserHERRY,    Date of discharge: 09/30/2017  Admitting diagnosis: BREECH PRESENTATION Intrauterine pregnancy: 5840w2d     Secondary diagnosis:  Active Problems:   Obesity (BMI 30.0-34.9)   Poor weight gain of pregnancy, third trimester   Labor and delivery, indication for care   Breech presentation   Additional problems: Gallstones in pregnancy, Hyperemesis     Discharge diagnosis: Term Pregnancy Delivered                                                                                                Post partum procedures:None  Augmentation: N/A  Complications: None  Hospital course:  Sceduled C/S   22 y.o. yo G1P1001 at 3740w2d was admitted to the hospital 09/27/2017 for scheduled cesarean section with the following indication:Malpresentation.  Membrane Rupture Time/Date: 12:34 PM ,09/27/2017   Patient delivered a Viable female infant.09/27/2017  Details of operation can be found in separate operative note.  Pateint had an uncomplicated postpartum course.  She is ambulating, tolerating a regular diet, passing flatus, and urinating well. Patient is discharged home in stable condition on  09/30/17         Physical exam  Vitals:   09/29/17 1647 09/29/17 2033 09/30/17 0044 09/30/17 0815  BP: 115/71 109/67 114/74 108/67  Pulse: 70 70 73 63  Resp: 18 18 18 18   Temp: 98.5 F (36.9 C) 98.2 F (36.8 C) 98.1 F (36.7 C) 97.9 F (36.6 C)  TempSrc: Oral Oral Oral Oral  SpO2: 97% 100% 100% 100%   General: alert and no distress Lochia: appropriate Uterine Fundus: firm Incision: Healing well with no significant drainage, No significant erythema, Dressing is clean, dry, and intact DVT Evaluation: No evidence of DVT seen on physical exam. Negative Homan's sign. No cords or calf tenderness. No significant calf/ankle edema. Labs: Lab Results  Component  Value Date   WBC 9.1 09/28/2017   HGB 9.5 (L) 09/28/2017   HCT 28.1 (L) 09/28/2017   MCV 82.4 09/28/2017   PLT 174 09/28/2017   CMP Latest Ref Rng & Units 09/14/2017  Glucose 70 - 99 mg/dL 78(G64(L)  BUN 6 - 20 mg/dL <9(F<5(L)  Creatinine 6.210.44 - 1.00 mg/dL 3.080.58  Sodium 657135 - 846145 mmol/L 138  Potassium 3.5 - 5.1 mmol/L 3.5  Chloride 98 - 111 mmol/L 108  CO2 22 - 32 mmol/L 23  Calcium 8.9 - 10.3 mg/dL 9.6(E8.6(L)  Total Protein 6.5 - 8.1 g/dL 6.8  Total Bilirubin 0.3 - 1.2 mg/dL 0.8  Alkaline Phos 38 - 126 U/L 125  AST 15 - 41 U/L 14(L)  ALT 0 - 44 U/L 8    Discharge instruction: per After Visit Summary and "Baby and Me Booklet".  After visit meds:  Allergies as of 09/30/2017      Reactions   Shellfish Allergy Anaphylaxis, Swelling   Bee Venom Hives, Itching      Medication List    STOP taking these medications   azithromycin 250 MG tablet  Commonly known as:  ZITHROMAX   CARAFATE 1 GM/10ML suspension Generic drug:  sucralfate     TAKE these medications   docusate sodium 100 MG capsule Commonly known as:  COLACE Take 1 capsule (100 mg total) by mouth daily as needed.   famotidine 20 MG tablet Commonly known as:  PEPCID TK 1 T PO BID What changed:    how much to take  how to take this  when to take this  additional instructions   flintstones complete 60 MG chewable tablet Chew 2 tablets by mouth daily.   ibuprofen 800 MG tablet Commonly known as:  ADVIL,MOTRIN Take 1 tablet (800 mg total) by mouth every 8 (eight) hours as needed.   ondansetron 4 MG disintegrating tablet Commonly known as:  ZOFRAN-ODT DISSOLVE 1 TABLET ON THE TONGUE EVERY 8 HOURS AS NEEDED FOR NAUSEA OR VOMITING What changed:    how much to take  how to take this  when to take this  reasons to take this  additional instructions   oxyCODONE-acetaminophen 5-325 MG tablet Commonly known as:  PERCOCET/ROXICET Take 1-2 tablets by mouth every 6 (six) hours as needed (pain scale 4-7).    promethazine 25 MG tablet Commonly known as:  PHENERGAN Take 1 tablet (25 mg total) by mouth every 6 (six) hours as needed for nausea or vomiting.       Diet: routine diet  Activity: Advance as tolerated. Pelvic rest for 6 weeks.   Outpatient follow up:1 week Follow up Appt: Future Appointments  Date Time Provider Department Center  10/11/2017  1:30 PM Hildred Laser, MD [facility] None   Follow up Visit:No follow-ups on file.  Postpartum contraception: Nexplanon  Newborn Data: Live born female  Birth Weight: 8 lb 9.6 oz (3900 g) APGAR: 8, 8  Newborn Delivery   Birth date/time:  09/27/2017 12:36:00 Delivery type:  C-Section, Low Transverse C-section categorization:  Primary     Baby Feeding: Bottle and Breast Disposition:home with mother   09/30/2017 Hildred Laser, MD

## 2017-10-11 ENCOUNTER — Ambulatory Visit (INDEPENDENT_AMBULATORY_CARE_PROVIDER_SITE_OTHER): Payer: Medicaid Other | Admitting: Obstetrics and Gynecology

## 2017-10-11 ENCOUNTER — Encounter: Payer: Self-pay | Admitting: Obstetrics and Gynecology

## 2017-10-11 VITALS — BP 107/69 | HR 65 | Ht 66.0 in | Wt 196.9 lb

## 2017-10-11 DIAGNOSIS — Z98891 History of uterine scar from previous surgery: Secondary | ICD-10-CM

## 2017-10-11 DIAGNOSIS — Z09 Encounter for follow-up examination after completed treatment for conditions other than malignant neoplasm: Secondary | ICD-10-CM

## 2017-10-11 NOTE — Progress Notes (Signed)
Pt is present today for c-section check. Pt stated that she is doing well and her incision is healing properly.

## 2017-10-11 NOTE — Progress Notes (Signed)
    OBSTETRICS/GYNECOLOGY POST-OPERATIVE CLINIC VISIT  Subjective:     Tiffany Andrews is a 22 y.o. female who presents to the clinic 1 weeks status post primary C-section for breech presentation. Eating a regular diet without difficulty. Bowel movements are normal. Pain is controlled with current analgesics. Medications being used: prescription NSAID's including ibuprofen (Motrin).  The following portions of the patient's history were reviewed and updated as appropriate: allergies, current medications, past family history, past medical history, past social history, past surgical history and problem list.  Review of Systems Pertinent items noted in HPI and remainder of comprehensive ROS otherwise negative.    Objective:    BP 107/69   Pulse 65   Ht 5\' 6"  (1.676 m)   Wt 196 lb 14.4 oz (89.3 kg)   Breastfeeding? No   BMI 31.78 kg/m  General:  alert and no distress  Abdomen: soft, bowel sounds active, non-tender  Incision:   healing well, no drainage, no erythema, no hernia, no seroma, no swelling, no dehiscence, incision well approximated    Assessment:    Doing well postoperatively. S/p cesarean section  Plan:   1. Continue any current medications. 2. Wound care discussed. 3. Activity restrictions: no bending, stooping, or squatting and no lifting more than 10-15 pounds  4. Anticipated return to work: 5 weeks. 5. Follow up: 5 weeks for postpartum visit.     Hildred Laser, MD [facility]

## 2017-10-18 ENCOUNTER — Encounter: Payer: Self-pay | Admitting: Obstetrics and Gynecology

## 2017-10-18 ENCOUNTER — Ambulatory Visit (INDEPENDENT_AMBULATORY_CARE_PROVIDER_SITE_OTHER): Payer: Medicaid Other | Admitting: Obstetrics and Gynecology

## 2017-10-18 VITALS — BP 93/62 | HR 69 | Ht 66.0 in | Wt 201.4 lb

## 2017-10-18 DIAGNOSIS — F53 Postpartum depression: Secondary | ICD-10-CM

## 2017-10-18 DIAGNOSIS — O99345 Other mental disorders complicating the puerperium: Secondary | ICD-10-CM

## 2017-10-18 MED ORDER — SERTRALINE HCL 50 MG PO TABS: 50.0000 mg | ORAL_TABLET | Freq: Every day | ORAL | 3 refills | Status: DC

## 2017-10-18 NOTE — Patient Instructions (Signed)
Suicidal Feelings: How to Help Yourself  Suicide is the taking of one's own life. If you feel as though life is getting too tough to handle and are thinking about suicide, get help right away. To get help:  Call your local emergency services (911 in the [place]  Call a suicide hotline to speak with a trained counselor who understands how you are feeling. The following is a list of suicide hotlines in the [place]. For a list of hotlines in [place], visit www.suicide.org/hotlines/international/[place]-suicide-hotlines.html.  1-800-273-TALK (1-800-273-8255).  1-800-SUICIDE (1-800-784-2433).  1-888-628-9454. This is a hotline for Spanish speakers.  1-800-799-4TTY (1-800-799-4889). This is a hotline for TTY users.  1-866-4-U-TREVOR (1-866-488-7386). This is a hotline for lesbian, gay, bisexual, transgender, or questioning youth.  Contact a crisis center or a local suicide prevention center. To find a crisis center or suicide prevention center:  Call your local hospital, clinic, community service organization, mental health center, social service provider, or health department. Ask for assistance in connecting to a crisis center.  Visit www.suicidepreventionlifeline.org/getinvolved/locator for a list of crisis centers in the [place], or visit www.suicideprevention.[place]/thinking-about-suicide/find-a-crisis-centre for a list of centers in [place].  Visit the following websites:  National Suicide Prevention Lifeline: www.suicidepreventionlifeline.org  Hopeline: www.hopeline.com  American Foundation for Suicide Prevention: www.afsp.org  The Trevor Project (for lesbian, gay, bisexual, transgender, or questioning youth): www.thetrevorproject.org    How can I help myself feel better?  Promise yourself that you will not do anything drastic when you have suicidal feelings. Remember, there is hope. Many people have gotten through suicidal thoughts and feelings, and you will, too. You may have gotten through them before, and  this proves that you can get through them again.  Let family, friends, teachers, or counselors know how you are feeling. Try not to isolate yourself from those who care about you. Remember, they will want to help you. Talk with someone every day, even if you do not feel sociable. Face-to-face conversation is best.  Call a mental health professional and see one regularly.  Visit your primary health care provider every year.  Eat a well-balanced diet, and space your meals so you eat regularly.  Get plenty of rest.  Avoid alcohol and drugs, and remove them from your home. They will only make you feel worse.  If you are thinking of taking a lot of medicine, give your medicine to someone who can give it to you one day at a time. If you are on antidepressants and are concerned you will overdose, let your health care provider know so he or she can give you safer medicines. Ask your mental health professional about the possible side effects of any medicines you are taking.  Remove weapons, poisons, knives, and anything else that could harm you from your home.  Try to stick to routines. Follow a schedule every day. Put self-care on your schedule.  Make a list of realistic goals, and cross them off when you achieve them. Accomplishments give a sense of worth.  Wait until you are feeling better before doing the things you find difficult or unpleasant.  Exercise if you are able. You will feel better if you exercise for even a half hour each day.  Go out in the sun or into nature. This will help you recover from depression faster. If you have a favorite place to walk, go there.  Do the things that have always given you pleasure. Play your favorite music, read a good book, paint a picture, play your favorite   instrument, or do anything else that takes your mind off your depression if it is safe to do.  Keep your living space well lit.  When you are feeling well, write yourself a letter about tips and support that you can read when  you are not feeling well.  Remember that life's difficulties can be sorted out with help. Conditions can be treated. You can work on thoughts and strategies that serve you well.  This information is not intended to replace advice given to you by your health care provider. Make sure you discuss any questions you have with your health care provider.  Document Released: 07/31/2002 Document Revised: 09/23/2015 Document Reviewed: 05/21/2013  Elsevier Interactive Patient Education  2018 [employer]

## 2017-10-18 NOTE — Progress Notes (Signed)
  PT is present today stating that she is really emotional and she noticed it at the hospital after the birth of the baby. Pt stated that it has continued to the point that she do not want to be bother by the baby or her husband she just want to sit in her room all by herself.  EPDS= 17.

## 2017-10-18 NOTE — Progress Notes (Signed)
    GYNECOLOGY PROGRESS NOTE  Subjective:    Patient ID: Tiffany Andrews, female    DOB: Jun 08, 1995, 22 y.o.   MRN: [id number]  HPI  Patient is a 22 y.o. G71P1001 female who is 3 weeks postpartum s/p primary C-section for breech presentation who presents for evaluation for postpartum depression.  Rayley reports that she is very emotional, having frequent angry outbursts with family and friends.  She also notes that she is becoming more tearful often after being asked simple questions.  She notes that she is at the point that she does not want to be bothered with the baby or her husband and just wants to sit in her room by herself.  EPDS = 17. Denies SI/HI.  Of note, she does report that they baby had to be admitted to [facility] due to projectile vomiting for several days and not being able to eat.  The baby was recently released several days ago.  Has not gotten much sleep lately as the baby is waking up every 2 hours to feed as she is having to reduce the baby's intake to decrease episodes of vomiting.    The following portions of the patient's history were reviewed and updated as appropriate: allergies, current medications, past family history, past medical history, past social history, past surgical history and problem list.  Review of Systems A comprehensive review of systems was negative except for: knot on right side of incision, tender   Objective:   Blood pressure 93/62, pulse 69, height 5\' 6"  (1.676 m), weight 201 lb 6.4 oz (91.4 kg), not currently breastfeeding. General appearance: alert and no distress Abdomen: soft, non-tender; bowel sounds normal; no masses,  no organomegaly.  Incision: healing well, no seroma, hematoma. Small nodule noted in right corner of incision, likely suture material.  Psychiatric: depressed mood, tearful at times, flattened affect, normal speech and though content.   Assessment:   Postpartum depression  Plan:   Discussion had with patient today  regarding symptoms.  Likely suffering from postpartum depression.  Currently no SI/HI.  Patient notes that she has good support at home.  Advised on getting as much rest as possible and utilizing her support system as sleep deprivation can also intensify symptoms. Discussed options for management, including counseling and/or medications, or both.  Patient desires medication for now, notes that she does not feel right about talking about her symptoms to strangers at this time.  Will prescribe Zoloft 50 mg (to start with 1/2 tablet, then increase after 2 weeks if needed).    A total of 15 minutes were spent face-to-face with the patient during this encounter and over half of that time dealt with counseling and coordination of care.  Hildred Laser, MD [facility]

## 2017-11-13 ENCOUNTER — Other Ambulatory Visit: Payer: Self-pay | Admitting: Orthopedic Surgery

## 2017-11-13 DIAGNOSIS — M25512 Pain in left shoulder: Secondary | ICD-10-CM

## 2017-11-15 ENCOUNTER — Ambulatory Visit (INDEPENDENT_AMBULATORY_CARE_PROVIDER_SITE_OTHER): Payer: Medicaid Other | Admitting: Obstetrics and Gynecology

## 2017-11-15 ENCOUNTER — Encounter: Payer: Self-pay | Admitting: Obstetrics and Gynecology

## 2017-11-15 DIAGNOSIS — O9081 Anemia of the puerperium: Secondary | ICD-10-CM

## 2017-11-15 DIAGNOSIS — Z30017 Encounter for initial prescription of implantable subdermal contraceptive: Secondary | ICD-10-CM

## 2017-11-15 DIAGNOSIS — O99345 Other mental disorders complicating the puerperium: Secondary | ICD-10-CM

## 2017-11-15 DIAGNOSIS — Z3049 Encounter for surveillance of other contraceptives: Secondary | ICD-10-CM

## 2017-11-15 DIAGNOSIS — F53 Postpartum depression: Secondary | ICD-10-CM

## 2017-11-15 LAB — POCT URINE PREGNANCY: Preg Test, Ur: NEGATIVE

## 2017-11-15 NOTE — Patient Instructions (Signed)
NEXPLANON PLACEMENT POST-PROCEDURE INSTRUCTIONS  1. You may take Ibuprofen, Aleve or Tylenol for pain if needed.  Pain should resolve within in 24 hours.  2. You may have intercourse after 24 hours.  If you using this for birth control, it is effective immediately.  3. You need to call if you have any fever, heavy bleeding, or redness at insertion site. Irregular bleeding is common the first several months after having a Nexplanonplaced. You do not need to call for this reason unless you are concerned.  4. Shower or bathe as normal.  You can remove the bandage after 24 hours. 5.  6. Use back up method of contraception for 1 week.    Etonogestrel implant What is this medicine? ETONOGESTREL (et oh noe JES trel) is a contraceptive (birth control) device. It is used to prevent pregnancy. It can be used for up to 3 years. This medicine may be used for other purposes; ask your health care provider or pharmacist if you have questions. COMMON BRAND NAME(S): Implanon, Nexplanon What should I tell my health care provider before I take this medicine? They need to know if you have any of these conditions: -abnormal vaginal bleeding -blood vessel disease or blood clots -cancer of the breast, cervix, or liver -depression -diabetes -gallbladder disease -headaches -heart disease or recent heart attack -high blood pressure -high cholesterol -kidney disease -liver disease -renal disease -seizures -tobacco smoker -an unusual or allergic reaction to etonogestrel, other hormones, anesthetics or antiseptics, medicines, foods, dyes, or preservatives -pregnant or trying to get pregnant -breast-feeding How should I use this medicine? This device is inserted just under the skin on the inner side of your upper arm by a health care professional. Talk to your pediatrician regarding the use of this medicine in children. Special care may be needed. Overdosage: If you think you have taken too much of this  medicine contact a poison control center or emergency room at once. NOTE: This medicine is only for you. Do not share this medicine with others. What if I miss a dose? This does not apply. What may interact with this medicine? Do not take this medicine with any of the following medications: -amprenavir -bosentan -fosamprenavir This medicine may also interact with the following medications: -barbiturate medicines for inducing sleep or treating seizures -certain medicines for fungal infections like ketoconazole and itraconazole -grapefruit juice -griseofulvin -medicines to treat seizures like carbamazepine, felbamate, oxcarbazepine, phenytoin, topiramate -modafinil -phenylbutazone -rifampin -rufinamide -some medicines to treat HIV infection like atazanavir, indinavir, lopinavir, nelfinavir, tipranavir, ritonavir -St. John's wort This list may not describe all possible interactions. Give your health care provider a list of all the medicines, herbs, non-prescription drugs, or dietary supplements you use. Also tell them if you smoke, drink alcohol, or use illegal drugs. Some items may interact with your medicine. What should I watch for while using this medicine? This product does not protect you against HIV infection (AIDS) or other sexually transmitted diseases. You should be able to feel the implant by pressing your fingertips over the skin where it was inserted. Contact your doctor if you cannot feel the implant, and use a non-hormonal birth control method (such as condoms) until your doctor confirms that the implant is in place. If you feel that the implant may have broken or become bent while in your arm, contact your healthcare provider. What side effects may I notice from receiving this medicine? Side effects that you should report to your doctor or health care professional as soon as  possible: -allergic reactions like skin rash, itching or hives, swelling of the face, lips, or  tongue -breast lumps -changes in emotions or moods -depressed mood -heavy or prolonged menstrual bleeding -pain, irritation, swelling, or bruising at the insertion site -scar at site of insertion -signs of infection at the insertion site such as fever, and skin redness, pain or discharge -signs of pregnancy -signs and symptoms of a blood clot such as breathing problems; changes in vision; chest pain; severe, sudden headache; pain, swelling, warmth in the leg; trouble speaking; sudden numbness or weakness of the face, arm or leg -signs and symptoms of liver injury like dark yellow or brown urine; general ill feeling or flu-like symptoms; light-colored stools; loss of appetite; nausea; right upper belly pain; unusually weak or tired; yellowing of the eyes or skin -unusual vaginal bleeding, discharge -signs and symptoms of a stroke like changes in vision; confusion; trouble speaking or understanding; severe headaches; sudden numbness or weakness of the face, arm or leg; trouble walking; dizziness; loss of balance or coordination Side effects that usually do not require medical attention (report to your doctor or health care professional if they continue or are bothersome): -acne -back pain -breast pain -changes in weight -dizziness -general ill feeling or flu-like symptoms -headache -irregular menstrual bleeding -nausea -sore throat -vaginal irritation or inflammation This list may not describe all possible side effects. Call your doctor for medical advice about side effects. You may report side effects to FDA at 1-800-FDA-1088. Where should I keep my medicine? This drug is given in a hospital or clinic and will not be stored at home. NOTE: This sheet is a summary. It may not cover all possible information. If you have questions about this medicine, talk to your doctor, pharmacist, or health care provider.  2018 Elsevier/Gold Standard (2015-08-13 11:19:22)

## 2017-11-15 NOTE — Progress Notes (Signed)
   OBSTETRICS POSTPARTUM CLINIC PROGRESS NOTE  Subjective:     Tiffany Andrews is a 22 y.o. G44P1001 female who presents for a postpartum visit. She is 6 weeks postpartum following a primary low cervical transverse Cesarean section. I have fully reviewed the prenatal and intrapartum course. The delivery was at 39 gestational weeks.  Anesthesia: spinal. Postpartum course has been complicated by postpartum depression, with onset at 3 weeks postpartum. Baby's course has been well. Baby is feeding by bottle. Bleeding: patient has not resumed menses, with No LMP recorded.. Bowel function is normal. Bladder function is normal. Patient is sexually active (had 1 episode, protected). Contraception method desired is none. Postpartum depression screening: positive (EPDS = 11, decreased from 17 several weeks ago), currently on 50 mg of Zoloft. Denies any side effects, notes she is doing well on the medication.   The following portions of the patient's history were reviewed and updated as appropriate: allergies, current medications, past family history, past medical history, past social history, past surgical history and problem list.  Review of Systems Pertinent items noted in HPI and remainder of comprehensive ROS otherwise negative.   Objective:    There were no vitals taken for this visit.  General:  alert and no distress   Breasts:  inspection negative, no nipple discharge or bleeding, no masses or nodularity palpable  Lungs: clear to auscultation bilaterally  Heart:  regular rate and rhythm, S1, S2 normal, no murmur, click, rub or gallop  Abdomen: soft, non-tender; bowel sounds normal; no masses,  no organomegaly.  Well healed Pfannenstiel incision   Vulva:  normal  Vagina: normal vagina, no discharge, exudate, lesion, or erythema  Cervix:  no cervical motion tenderness and no lesions  Corpus: normal size, contour, position, consistency, mobility, non-tender  Adnexa:  normal adnexa and no mass,  fullness, tenderness  Rectal Exam: Not performed.         Labs:  Lab Results  Component Value Date   HGB 9.5 (L) 09/28/2017     Assessment:    Routine postpartum exam.  S/p Cesarean section.   Postpartum anemia  Postpartum depression  Contraception management  Plan:    1.Contraception: Nexplanon.  Inserted today (see procedure note below) 2. Will check Hgb for h/o anemia.  3. Continue Zoloft for postpartum depression. 4. Follow up in: 3 months to f/u postpartum depression and annual exam, or sooner as needed.    Hildred Laser, MD [facility]      Nexplanon Insertion Procedure Patient identified, informed consent performed, consent signed.   Patient does understand that irregular bleeding is a very common side effect of this medication. She was advised to have backup contraception for one week after placement. Pregnancy test in clinic today was negative.  Appropriate time out taken.  Patient's left arm was prepped and draped in the usual sterile fashion. The ruler used to measure and mark insertion area.  Patient was prepped with alcohol swab and then injected with 3 ml of 1% lidocaine.  She was prepped with betadine, Nexplanon removed from packaging,  Device confirmed in needle, then inserted full length of needle and withdrawn per handbook instructions. Nexplanon was able to palpated in the patient's arm; patient palpated the insert herself. There was minimal blood loss.  Patient insertion site covered with guaze and a pressure bandage to reduce any bruising.  The patient tolerated the procedure well and was given post procedure instructions.   Exp: 12/21/2019 Lot#: [id number].

## 2017-11-15 NOTE — Progress Notes (Signed)
Pt is present today for c-section incision. Pt stated her c-section incision is healing well. Pt stated that she is doing well no complaints. EPDS= 11.

## 2017-11-15 NOTE — Addendum Note (Signed)
Addended by: Silvano Bilis on: 11/15/2017 05:07 PM   Modules accepted: Orders

## 2017-11-16 ENCOUNTER — Other Ambulatory Visit: Payer: Medicaid Other

## 2017-11-17 LAB — HEMOGLOBIN AND HEMATOCRIT, BLOOD
Hematocrit: 37.4 % (ref 34.0–46.6)
Hemoglobin: 11.7 g/dL (ref 11.1–15.9)

## 2017-11-30 ENCOUNTER — Ambulatory Visit
Admission: RE | Admit: 2017-11-30 | Discharge: 2017-11-30 | Disposition: A | Discharge status: Home / Self Care | Payer: Medicaid Other | Source: Ambulatory Visit | Attending: Orthopedic Surgery | Admitting: Orthopedic Surgery

## 2017-11-30 DIAGNOSIS — M25512 Pain in left shoulder: Secondary | ICD-10-CM

## 2017-11-30 MED ORDER — GADOBUTROL 1 MMOL/ML IV SOLN
0.1000 mL | Freq: Once | INTRAVENOUS | Status: AC | PRN
  Administered 2017-11-30: 0.05 mL via INTRAVENOUS

## 2017-11-30 MED ORDER — LIDOCAINE HCL (PF) 1 % IJ SOLN
5.0000 mL | Freq: Once | INTRAMUSCULAR | Status: AC
  Administered 2017-11-30: 5 mL
  Filled 2017-11-30: qty 5

## 2017-11-30 MED ORDER — SODIUM CHLORIDE 0.9% FLUSH
7.0000 mL | Freq: Once | INTRAVENOUS | Status: AC
  Administered 2017-11-30: 6 mL

## 2017-11-30 MED ORDER — IOPAMIDOL (ISOVUE-200) INJECTION 41%
1.0000 mL | Freq: Once | INTRAVENOUS | Status: AC | PRN
  Administered 2017-11-30: 6 mL
  Filled 2017-11-30: qty 50

## 2017-12-12 ENCOUNTER — Other Ambulatory Visit: Payer: Self-pay

## 2017-12-12 ENCOUNTER — Ambulatory Visit: Payer: Medicaid Other | Admitting: Surgery

## 2017-12-12 ENCOUNTER — Encounter: Payer: Self-pay | Admitting: Surgery

## 2017-12-12 VITALS — BP 128/80 | HR 72 | Temp 97.5°F | Wt 220.0 lb

## 2017-12-12 DIAGNOSIS — K8020 Calculus of gallbladder without cholecystitis without obstruction: Secondary | ICD-10-CM

## 2017-12-12 NOTE — Progress Notes (Signed)
Surgical Clinic Progress/Follow-up Note   HPI:  22 y.o. Female 10 weeks post-partum presents to clinic for follow-up evaluation of symptomatic cholelithiasis that has remained problematic throughout her pregnancy and for which she appears to have been admitted to [facility] early during her third trimester despite dietary changes discussed during her first trimester. Due to 3rd trimester surgical/anesthesia risk, she was transferred to [facility] for cholecystectomy, but when that was not offered, patient requested discharge with a very unfavorable view of her experience. Patient reports her N/V have improved since delivery, but she continues to have intermittent RUQ pain, worst after fatty foods/meals in particular. She otherwise denies any fever/chills, CP, or SOB.  Review of Systems:  Constitutional: denies any other weight loss, fever, chills, or sweats  Eyes: denies any other vision changes, history of eye injury  ENT: denies sore throat, hearing problems  Respiratory: denies shortness of breath, wheezing  Cardiovascular: denies chest pain, palpitations  Gastrointestinal: abdominal pain, N/V, and bowel function as per HPI Musculoskeletal: denies any other joint pains or cramps  Skin: Denies any other rashes or skin discolorations  Neurological: denies any other headache, dizziness, weakness  Psychiatric: denies any other depression, anxiety  All other review of systems: otherwise negative   Vital Signs:  BP 128/80   Pulse 72   Temp (!) 97.5 F (36.4 C) (Skin)   Wt 220 lb (99.8 kg)   LMP 11/27/2017   BMI 35.51 kg/m    Physical Exam:  Constitutional:  -- Obese body habitus (albeit post-partum)  -- Awake, alert, and oriented x3  Eyes:  -- Pupils equally round and reactive to light  -- No scleral icterus  Ear, nose, throat:  -- No jugular venous distension  -- No nasal drainage, bleeding Pulmonary:  -- No crackles -- Equal breath sounds bilaterally -- Breathing non-labored at  rest Cardiovascular:  -- S1, S2 present  -- No pericardial rubs  Gastrointestinal:  -- Soft, nontender, non-distended, no guarding/rebound  -- No abdominal masses appreciated, pulsatile or otherwise  Musculoskeletal / Integumentary:  -- Wounds or skin discoloration: None appreciated  -- Extremities: B/L UE and LE FROM, hands and feet warm Neurologic:  -- Motor function: intact and symmetric  -- Sensation: intact and symmetric   Laboratory studies:  CBC Latest Ref Rng & Units 11/16/2017 09/28/2017 09/26/2017  WBC 3.6 - 11.0 K/uL - 9.1 10.7  Hemoglobin 11.1 - 15.9 g/dL 16.1 0.9(U) 11.9(L)  Hematocrit 34.0 - 46.6 % 37.4 28.1(L) 35.1  Platelets 150 - 440 K/uL - 174 224   CMP Latest Ref Rng & Units 09/14/2017 07/03/2017 07/02/2017  Glucose 70 - 99 mg/dL 04(V) 409(W) -  BUN 6 - 20 mg/dL <1(X) <9(J) -  Creatinine 0.44 - 1.00 mg/dL 4.78 2.95 -  Sodium 621 - 145 mmol/L 138 137 136  Potassium 3.5 - 5.1 mmol/L 3.5 3.6 3.8  Chloride 98 - 111 mmol/L 108 107 107  CO2 22 - 32 mmol/L 23 24 26   Calcium 8.9 - 10.3 mg/dL 3.0(Q) 6.5(H) -  Total Protein 6.5 - 8.1 g/dL 6.8 6.6 -  Total Bilirubin 0.3 - 1.2 mg/dL 0.8 0.3 -  Alkaline Phos 38 - 126 U/L 125 65 -  AST 15 - 41 U/L 14(L) 13(L) -  ALT 0 - 44 U/L 8 11(L) -   Imaging:  Limited RUQ Abdominal Ultrasound (07/04/2017) Cholelithiasis.  No sonographic evidence for acute cholecystitis.   Assessment:  22 y.o. yo Female with a problem list including...  Patient Active Problem List  Diagnosis Date Noted  . Breech presentation 09/27/2017  . S/P cesarean section 09/27/2017  . Labor and delivery, indication for care 08/31/2017  . Poor weight gain of pregnancy, third trimester 08/08/2017  . Cholecystitis   . Gallstone 06/30/2017  . Pregnancy 06/12/2017  . Gallstones 05/12/2017  . Obesity (BMI 30.0-34.9) 04/26/2017    22 y.o. female with persistent peri/post-partum symptomatic cholelithiasis, complicated and exacerbated by patient's recent pregnancy  and by comorbidities including primarily obesity (BMI 36).               - avoid/minimize foods with higher fat content (meats, cheeses/dairy, and fried)             - prefer low-fat vegetables, whole grains (wheat bread, ceareals, etc), and fruits until cholecystectomy              - all risks, benefits, and alternatives to cholecystectomy were discussed with the patient, all of her questions were answered to patient's expressed satisfaction, patient expresses she wishes to proceed, and informed consent was obtained.             - will plan for laparoscopic cholecystectomy pending anesthesia and OR availability             - anticipate return to clinic 2 weeks after above planned surgery             - instructed to call if any questions or concerns  All of the above recommendations were discussed with the patient and patient's family, and all of patient's and family's questions were answered to their expressed satisfaction.  -- Scherrie Gerlach Earlene Plater, MD, RPVI [place]:  [facility] - Partnering for exceptional care. Office: [phone]

## 2017-12-12 NOTE — H&P (View-Only) (Signed)
Surgical Clinic Progress/Follow-up Note   HPI:  22 y.o. Female 10 weeks post-partum presents to clinic for follow-up evaluation of symptomatic cholelithiasis that has remained problematic throughout her pregnancy and for which she appears to have been admitted to [facility] early during her third trimester despite dietary changes discussed during her first trimester. Due to 3rd trimester surgical/anesthesia risk, she was transferred to [facility] for cholecystectomy, but when that was not offered, patient requested discharge with a very unfavorable view of her experience. Patient reports her N/V have improved since delivery, but she continues to have intermittent RUQ pain, worst after fatty foods/meals in particular. She otherwise denies any fever/chills, CP, or SOB.  Review of Systems:  Constitutional: denies any other weight loss, fever, chills, or sweats  Eyes: denies any other vision changes, history of eye injury  ENT: denies sore throat, hearing problems  Respiratory: denies shortness of breath, wheezing  Cardiovascular: denies chest pain, palpitations  Gastrointestinal: abdominal pain, N/V, and bowel function as per HPI Musculoskeletal: denies any other joint pains or cramps  Skin: Denies any other rashes or skin discolorations  Neurological: denies any other headache, dizziness, weakness  Psychiatric: denies any other depression, anxiety  All other review of systems: otherwise negative   Vital Signs:  BP 128/80   Pulse 72   Temp (!) 97.5 F (36.4 C) (Skin)   Wt 220 lb (99.8 kg)   LMP 11/27/2017   BMI 35.51 kg/m    Physical Exam:  Constitutional:  -- Obese body habitus (albeit post-partum)  -- Awake, alert, and oriented x3  Eyes:  -- Pupils equally round and reactive to light  -- No scleral icterus  Ear, nose, throat:  -- No jugular venous distension  -- No nasal drainage, bleeding Pulmonary:  -- No crackles -- Equal breath sounds bilaterally -- Breathing non-labored at  rest Cardiovascular:  -- S1, S2 present  -- No pericardial rubs  Gastrointestinal:  -- Soft, nontender, non-distended, no guarding/rebound  -- No abdominal masses appreciated, pulsatile or otherwise  Musculoskeletal / Integumentary:  -- Wounds or skin discoloration: None appreciated  -- Extremities: B/L UE and LE FROM, hands and feet warm Neurologic:  -- Motor function: intact and symmetric  -- Sensation: intact and symmetric   Laboratory studies:  CBC Latest Ref Rng & Units 11/16/2017 09/28/2017 09/26/2017  WBC 3.6 - 11.0 K/uL - 9.1 10.7  Hemoglobin 11.1 - 15.9 g/dL 11.7 9.5(L) 11.9(L)  Hematocrit 34.0 - 46.6 % 37.4 28.1(L) 35.1  Platelets 150 - 440 K/uL - 174 224   CMP Latest Ref Rng & Units 09/14/2017 07/03/2017 07/02/2017  Glucose 70 - 99 mg/dL 64(L) 124(H) -  BUN 6 - 20 mg/dL <5(L) <5(L) -  Creatinine 0.44 - 1.00 mg/dL 0.58 0.48 -  Sodium 135 - 145 mmol/L 138 137 136  Potassium 3.5 - 5.1 mmol/L 3.5 3.6 3.8  Chloride 98 - 111 mmol/L 108 107 107  CO2 22 - 32 mmol/L 23 24 26  Calcium 8.9 - 10.3 mg/dL 8.6(L) 8.4(L) -  Total Protein 6.5 - 8.1 g/dL 6.8 6.6 -  Total Bilirubin 0.3 - 1.2 mg/dL 0.8 0.3 -  Alkaline Phos 38 - 126 U/L 125 65 -  AST 15 - 41 U/L 14(L) 13(L) -  ALT 0 - 44 U/L 8 11(L) -   Imaging:  Limited RUQ Abdominal Ultrasound (07/04/2017) Cholelithiasis.  No sonographic evidence for acute cholecystitis.   Assessment:  22 y.o. yo Female with a problem list including...  Patient Active Problem List     Diagnosis Date Noted  . Breech presentation 09/27/2017  . S/P cesarean section 09/27/2017  . Labor and delivery, indication for care 08/31/2017  . Poor weight gain of pregnancy, third trimester 08/08/2017  . Cholecystitis   . Gallstone 06/30/2017  . Pregnancy 06/12/2017  . Gallstones 05/12/2017  . Obesity (BMI 30.0-34.9) 04/26/2017    22 y.o. female with persistent peri/post-partum symptomatic cholelithiasis, complicated and exacerbated by patient's recent pregnancy  and by comorbidities including primarily obesity (BMI 36).               - avoid/minimize foods with higher fat content (meats, cheeses/dairy, and fried)             - prefer low-fat vegetables, whole grains (wheat bread, ceareals, etc), and fruits until cholecystectomy              - all risks, benefits, and alternatives to cholecystectomy were discussed with the patient, all of her questions were answered to patient's expressed satisfaction, patient expresses she wishes to proceed, and informed consent was obtained.             - will plan for laparoscopic cholecystectomy pending anesthesia and OR availability             - anticipate return to clinic 2 weeks after above planned surgery             - instructed to call if any questions or concerns  All of the above recommendations were discussed with the patient and patient's family, and all of patient's and family's questions were answered to their expressed satisfaction.  --  E. , MD, RPVI [place]: [facility] - Partnering for exceptional care. Office: [phone] 

## 2017-12-12 NOTE — Patient Instructions (Signed)
Laparoscopic Cholecystectomy, Care After This sheet gives you information about how to care for yourself after your procedure. Your doctor may also give you more specific instructions. If you have problems or questions, contact your doctor. Follow these instructions at home: Care for cuts from surgery (incisions)   Follow instructions from your doctor about how to take care of your cuts from surgery. Make sure you: ? Wash your hands with soap and water before you change your bandage (dressing). If you cannot use soap and water, use hand sanitizer. ? Change your bandage as told by your doctor. ? Leave stitches (sutures), skin glue, or skin tape (adhesive) strips in place. They may need to stay in place for 2 weeks or longer. If tape strips get loose and curl up, you may trim the loose edges. Do not remove tape strips completely unless your doctor says it is okay.  Do not take baths, swim, or use a hot tub until your doctor says it is okay. Ask your doctor if you can take showers. You may only be allowed to take sponge baths for bathing.  Check your surgical cut area every day for signs of infection. Check for: ? More redness, swelling, or pain. ? More fluid or blood. ? Warmth. ? Pus or a bad smell. Activity  Do not drive or use heavy machinery while taking prescription pain medicine.  Do not lift anything that is heavier than 20 lb for two weeks after surgery.  Do not play contact sports until your doctor says it is okay.  Do not drive for 24 hours if you were given a medicine to help you relax (sedative).  Rest as needed. Do not return to work or school until your doctor says it is okay. General instructions  Take over-the-counter and prescription medicines only as told by your doctor.  To prevent or treat constipation while you are taking prescription pain medicine, your doctor may recommend that you: ? Drink enough fluid to keep your pee (urine) clear or pale yellow. ? Take  over-the-counter or prescription medicines. ? Eat foods that are high in fiber, such as fresh fruits and vegetables, whole grains, and beans. ? Limit foods that are high in fat and processed sugars, such as fried and sweet foods. Contact a doctor if:  You develop a rash.  You have more redness, swelling, or pain around your surgical cuts.  You have more fluid or blood coming from your surgical cuts.  Your surgical cuts feel warm to the touch.  You have pus or a bad smell coming from your surgical cuts.  You have a fever.  One or more of your surgical cuts breaks open. Get help right away if:  You have trouble breathing.  You have chest pain.  You have pain that is getting worse in your shoulders.  You faint or feel dizzy when you stand.  You have very bad pain in your belly (abdomen).  You are sick to your stomach (nauseous) for more than one day.  You have throwing up (vomiting) that lasts for more than one day.  You have leg pain. This information is not intended to replace advice given to you by your health care provider. Make sure you discuss any questions you have with your health care provider. Document Released: 11/03/2007 Document Revised: 08/15/2015 Document Reviewed: 07/13/2015 Elsevier Interactive Patient Education  2018 [employer].

## 2017-12-13 ENCOUNTER — Telehealth: Payer: Self-pay

## 2017-12-13 NOTE — Telephone Encounter (Addendum)
Call to patient to see about scheduling surgery. The patient is scheduled for surgery at [facility] with Dr Aleen Campi on 12/22/17. She will pre admit by phone. The patient is aware to call the day before to get her arrival time. She is aware of date and instructions.

## 2017-12-19 ENCOUNTER — Encounter
Admission: RE | Admit: 2017-12-19 | Discharge: 2017-12-19 | Disposition: A | Discharge status: Home / Self Care | Payer: Medicaid Other | Source: Ambulatory Visit | Attending: Surgery | Admitting: Surgery

## 2017-12-19 ENCOUNTER — Other Ambulatory Visit: Payer: Self-pay

## 2017-12-19 HISTORY — DX: Adverse effect of unspecified anesthetic, initial encounter: T41.45XA

## 2017-12-19 HISTORY — DX: Anxiety disorder, unspecified: F41.9

## 2017-12-19 HISTORY — DX: Other complications of anesthesia, initial encounter: T88.59XA

## 2017-12-19 NOTE — Patient Instructions (Signed)
Your procedure is scheduled on: 12-22-17 FRIDAY Report to Same Day Surgery 2nd floor medical mall [facility](Medical Mall Entrance-take elevator on left to 2nd floor.  Check in with surgery information desk.) To find out your arrival time please call [phone] between 1PM - 3PM on 12-21-17 THURSDAY  Remember: Instructions that are not followed completely may result in serious medical risk, up to and including death, or upon the discretion of your surgeon and anesthesiologist your surgery may need to be rescheduled.    _x___ 1. Do not eat food after midnight the night before your procedure. NO GUM OR CANDY AFTER MIDNIGHT.  You may drink clear liquids up to 2 hours before you are scheduled to arrive at the hospital for your procedure.  Do not drink clear liquids within 2 hours of your scheduled arrival to the hospital.  Clear liquids include  --Water or Apple juice without pulp  --Clear carbohydrate beverage such as ClearFast or Gatorade  --Black Coffee or Clear Tea (No milk, no creamers, do not add anything to the coffee or Tea   ____Ensure clear carbohydrate drink on the way to the hospital for bariatric patients  ____Ensure clear carbohydrate drink 3 hours before surgery for Dr Rutherford NailByrnett's patients if physician instructed.    __x__ 2. No Alcohol for 24 hours before or after surgery.   __x__3. No Smoking or e-cigarettes for 24 prior to surgery.  Do not use any chewable tobacco products for at least 6 hour prior to surgery   ____  4. Bring all medications with you on the day of surgery if instructed.    __x__ 5. Notify your doctor if there is any change in your medical condition     (cold, fever, infections).    x___6. On the morning of surgery brush your teeth with toothpaste and water.  You may rinse your mouth with mouth wash if you wish.  Do not swallow any toothpaste or mouthwash.   Do not wear jewelry, make-up, hairpins, clips or nail polish.  Do not wear lotions, powders, or perfumes.  You may wear deodorant.  Do not shave 48 hours prior to surgery. Men may shave face and neck.  Do not bring valuables to the hospital.    [facility] is not responsible for any belongings or valuables.               Contacts, dentures or bridgework may not be worn into surgery.  Leave your suitcase in the car. After surgery it may be brought to your room.  For patients admitted to the hospital, discharge time is determined by your treatment team.  _  Patients discharged the day of surgery will not be allowed to drive home.  You will need someone to drive you home and stay with you the night of your procedure.    Please read over the following fact sheets that you were given:   [facility] Preparing [facility]   _x___ TAKE THE FOLLOWING MEDICATION THE MORNING OF SURGERY WITH A SMALL SIP OF WATER. These include:  1. PEPCID  2. ZOLOFT  3.  4.  5.  6.  ____Fleets enema or Magnesium Citrate as directed.   _x___ Use CHG Soap or sage wipes as directed on instruction sheet   ____ Use inhalers on the day of surgery and bring to hospital day of surgery  ____ Stop Metformin and Janumet 2 days prior to surgery.    ____ Take 1/2 of usual insulin dose the night before  surgery and none on the morning surgery.   ____ Follow recommendations from Cardiologist, Pulmonologist or PCP regarding stopping Aspirin, Coumadin, Plavix ,Eliquis, Effient, or Pradaxa, and Pletal.  X____Stop Anti-inflammatories such as Advil, Aleve, Ibuprofen, Motrin, Naproxen, Naprosyn, Goodies powders or aspirin products NOW-OK to take Tylenol    ____ Stop supplements until after surgery.     ____ Bring C-Pap to the hospital.

## 2017-12-21 ENCOUNTER — Encounter
Admission: RE | Admit: 2017-12-21 | Discharge: 2017-12-21 | Disposition: A | Discharge status: Home / Self Care | Payer: Medicaid Other | Source: Ambulatory Visit | Attending: Surgery | Admitting: Surgery

## 2017-12-21 DIAGNOSIS — Z01812 Encounter for preprocedural laboratory examination: Secondary | ICD-10-CM | POA: Diagnosis not present

## 2017-12-21 LAB — CBC WITH DIFFERENTIAL/PLATELET
Abs Immature Granulocytes: 0.04 10*3/uL (ref 0.00–0.07)
BASOS ABS: 0.1 10*3/uL (ref 0.0–0.1)
Basophils Relative: 1 %
EOS ABS: 0.1 10*3/uL (ref 0.0–0.5)
EOS PCT: 1 %
HCT: 37.7 % (ref 36.0–46.0)
HEMOGLOBIN: 11.8 g/dL — AB (ref 12.0–15.0)
Immature Granulocytes: 0 %
LYMPHS PCT: 28 %
Lymphs Abs: 2.5 10*3/uL (ref 0.7–4.0)
MCH: 26 pg (ref 26.0–34.0)
MCHC: 31.3 g/dL (ref 30.0–36.0)
MCV: 83.2 fL (ref 80.0–100.0)
MONO ABS: 0.5 10*3/uL (ref 0.1–1.0)
Monocytes Relative: 6 %
NRBC: 0 % (ref 0.0–0.2)
Neutro Abs: 5.8 10*3/uL (ref 1.7–7.7)
Neutrophils Relative %: 64 %
Platelets: 330 10*3/uL (ref 150–400)
RBC: 4.53 MIL/uL (ref 3.87–5.11)
RDW: 13.8 % (ref 11.5–15.5)
WBC: 9.1 10*3/uL (ref 4.0–10.5)

## 2017-12-21 LAB — COMPREHENSIVE METABOLIC PANEL
ALBUMIN: 3.9 g/dL (ref 3.5–5.0)
ALK PHOS: 56 U/L (ref 38–126)
ALT: 16 U/L (ref 0–44)
ANION GAP: 6 (ref 5–15)
AST: 15 U/L (ref 15–41)
BUN: 8 mg/dL (ref 6–20)
CALCIUM: 8.6 mg/dL — AB (ref 8.9–10.3)
CO2: 25 mmol/L (ref 22–32)
Chloride: 108 mmol/L (ref 98–111)
Creatinine, Ser: 0.66 mg/dL (ref 0.44–1.00)
GFR calc non Af Amer: >60 mL/min (ref >60)
GLUCOSE: 82 mg/dL (ref 70–99)
POTASSIUM: 3.9 mmol/L (ref 3.5–5.1)
SODIUM: 139 mmol/L (ref 135–145)
TOTAL PROTEIN: 7.3 g/dL (ref 6.5–8.1)
Total Bilirubin: 0.8 mg/dL (ref 0.3–1.2)

## 2017-12-21 MED ORDER — CEFAZOLIN SODIUM-DEXTROSE 2-4 GM/100ML-% IV SOLN: 2.0000 g | INTRAVENOUS | Status: DC

## 2017-12-22 ENCOUNTER — Ambulatory Visit: Payer: Medicaid Other | Admitting: Anesthesiology

## 2017-12-22 ENCOUNTER — Encounter
Admission: RE | Disposition: A | Discharge status: Home / Self Care | Payer: Self-pay | Source: Ambulatory Visit | Attending: Surgery

## 2017-12-22 ENCOUNTER — Encounter: Payer: Self-pay | Admitting: Anesthesiology

## 2017-12-22 ENCOUNTER — Ambulatory Visit
Admission: RE | Admit: 2017-12-22 | Discharge: 2017-12-22 | Disposition: A | Discharge status: Home / Self Care | Payer: Medicaid Other | Source: Ambulatory Visit | Attending: Surgery | Admitting: Surgery

## 2017-12-22 ENCOUNTER — Other Ambulatory Visit: Payer: Self-pay

## 2017-12-22 DIAGNOSIS — Z6835 Body mass index (BMI) 35.0-35.9, adult: Secondary | ICD-10-CM | POA: Insufficient documentation

## 2017-12-22 DIAGNOSIS — K8020 Calculus of gallbladder without cholecystitis without obstruction: Secondary | ICD-10-CM

## 2017-12-22 DIAGNOSIS — K8010 Calculus of gallbladder with chronic cholecystitis without obstruction: Secondary | ICD-10-CM | POA: Diagnosis not present

## 2017-12-22 DIAGNOSIS — E669 Obesity, unspecified: Secondary | ICD-10-CM | POA: Diagnosis not present

## 2017-12-22 HISTORY — PX: CHOLECYSTECTOMY: SHX55

## 2017-12-22 LAB — POCT PREGNANCY, URINE: Preg Test, Ur: NEGATIVE

## 2017-12-22 SURGERY — LAPAROSCOPIC CHOLECYSTECTOMY
Anesthesia: General

## 2017-12-22 MED ORDER — MIDAZOLAM HCL 2 MG/2ML IJ SOLN
INTRAMUSCULAR | Status: DC | PRN
  Administered 2017-12-22: 2 mg via INTRAVENOUS

## 2017-12-22 MED ORDER — OXYCODONE-ACETAMINOPHEN 5-325 MG PO TABS: 1.0000 | ORAL_TABLET | ORAL | 0 refills | Status: DC | PRN

## 2017-12-22 MED ORDER — DEXAMETHASONE SODIUM PHOSPHATE 10 MG/ML IJ SOLN
INTRAMUSCULAR | Status: DC | PRN
  Administered 2017-12-22: 10 mg via INTRAVENOUS

## 2017-12-22 MED ORDER — DEXMEDETOMIDINE HCL IN NACL 200 MCG/50ML IV SOLN
INTRAVENOUS | Status: AC
  Filled 2017-12-22: qty 50

## 2017-12-22 MED ORDER — CEFAZOLIN SODIUM-DEXTROSE 2-4 GM/100ML-% IV SOLN
INTRAVENOUS | Status: AC
  Filled 2017-12-22: qty 100

## 2017-12-22 MED ORDER — FENTANYL CITRATE (PF) 100 MCG/2ML IJ SOLN
INTRAMUSCULAR | Status: AC
  Administered 2017-12-22: 25 ug via INTRAVENOUS
  Filled 2017-12-22: qty 2

## 2017-12-22 MED ORDER — LACTATED RINGERS IV SOLN
INTRAVENOUS | Status: DC
  Administered 2017-12-22: 14:00:00 via INTRAVENOUS

## 2017-12-22 MED ORDER — PROPOFOL 10 MG/ML IV BOLUS
INTRAVENOUS | Status: AC
  Filled 2017-12-22: qty 20

## 2017-12-22 MED ORDER — ACETAMINOPHEN 500 MG PO TABS
1000.0000 mg | ORAL_TABLET | ORAL | Status: AC
  Administered 2017-12-22: 1000 mg via ORAL

## 2017-12-22 MED ORDER — ONDANSETRON HCL 4 MG/2ML IJ SOLN
4.0000 mg | Freq: Once | INTRAMUSCULAR | Status: AC | PRN
  Administered 2017-12-22: 4 mg via INTRAVENOUS

## 2017-12-22 MED ORDER — LIDOCAINE HCL 1 % IJ SOLN
INTRAMUSCULAR | Status: DC | PRN
  Administered 2017-12-22: 20 mL

## 2017-12-22 MED ORDER — MIDAZOLAM HCL 2 MG/2ML IJ SOLN
INTRAMUSCULAR | Status: AC
  Filled 2017-12-22: qty 2

## 2017-12-22 MED ORDER — GLYCOPYRROLATE 0.2 MG/ML IJ SOLN
INTRAMUSCULAR | Status: DC | PRN
  Administered 2017-12-22: 0.2 mg via INTRAVENOUS

## 2017-12-22 MED ORDER — SUGAMMADEX SODIUM 200 MG/2ML IV SOLN
INTRAVENOUS | Status: AC
  Filled 2017-12-22: qty 2

## 2017-12-22 MED ORDER — LIDOCAINE HCL (CARDIAC) PF 100 MG/5ML IV SOSY
PREFILLED_SYRINGE | INTRAVENOUS | Status: DC | PRN
  Administered 2017-12-22: 60 mg via INTRAVENOUS

## 2017-12-22 MED ORDER — ONDANSETRON HCL 4 MG/2ML IJ SOLN
INTRAMUSCULAR | Status: AC
  Administered 2017-12-22: 4 mg via INTRAVENOUS
  Filled 2017-12-22: qty 2

## 2017-12-22 MED ORDER — GLYCOPYRROLATE 0.2 MG/ML IJ SOLN
INTRAMUSCULAR | Status: AC
  Filled 2017-12-22: qty 1

## 2017-12-22 MED ORDER — CHLORHEXIDINE GLUCONATE CLOTH 2 % EX PADS: 6.0000 | MEDICATED_PAD | Freq: Once | CUTANEOUS | Status: DC

## 2017-12-22 MED ORDER — FENTANYL CITRATE (PF) 100 MCG/2ML IJ SOLN
INTRAMUSCULAR | Status: DC | PRN
  Administered 2017-12-22 (×5): 50 ug via INTRAVENOUS

## 2017-12-22 MED ORDER — ROCURONIUM BROMIDE 50 MG/5ML IV SOLN
INTRAVENOUS | Status: AC
  Filled 2017-12-22: qty 1

## 2017-12-22 MED ORDER — BUPIVACAINE HCL (PF) 0.5 % IJ SOLN
INTRAMUSCULAR | Status: AC
  Filled 2017-12-22: qty 30

## 2017-12-22 MED ORDER — OXYCODONE-ACETAMINOPHEN 5-325 MG PO TABS
ORAL_TABLET | ORAL | Status: AC
  Administered 2017-12-22: 1 via ORAL
  Filled 2017-12-22: qty 1

## 2017-12-22 MED ORDER — DEXMEDETOMIDINE HCL 200 MCG/2ML IV SOLN
INTRAVENOUS | Status: DC | PRN
  Administered 2017-12-22: 8 ug via INTRAVENOUS
  Administered 2017-12-22: 12 ug via INTRAVENOUS

## 2017-12-22 MED ORDER — ACETAMINOPHEN 500 MG PO TABS
ORAL_TABLET | ORAL | Status: AC
  Filled 2017-12-22: qty 2

## 2017-12-22 MED ORDER — OXYCODONE HCL 5 MG PO TABS
ORAL_TABLET | ORAL | Status: AC
  Filled 2017-12-22: qty 1

## 2017-12-22 MED ORDER — DEXAMETHASONE SODIUM PHOSPHATE 10 MG/ML IJ SOLN
INTRAMUSCULAR | Status: AC
  Filled 2017-12-22: qty 1

## 2017-12-22 MED ORDER — FENTANYL CITRATE (PF) 100 MCG/2ML IJ SOLN
25.0000 ug | INTRAMUSCULAR | Status: DC | PRN
  Administered 2017-12-22 (×5): 25 ug via INTRAVENOUS

## 2017-12-22 MED ORDER — FENTANYL CITRATE (PF) 250 MCG/5ML IJ SOLN
INTRAMUSCULAR | Status: AC
  Filled 2017-12-22: qty 5

## 2017-12-22 MED ORDER — ROCURONIUM BROMIDE 100 MG/10ML IV SOLN
INTRAVENOUS | Status: DC | PRN
  Administered 2017-12-22: 10 mg via INTRAVENOUS
  Administered 2017-12-22: 30 mg via INTRAVENOUS

## 2017-12-22 MED ORDER — PHENYLEPHRINE HCL 10 MG/ML IJ SOLN
INTRAMUSCULAR | Status: DC | PRN
  Administered 2017-12-22: 100 ug via INTRAVENOUS

## 2017-12-22 MED ORDER — SUGAMMADEX SODIUM 500 MG/5ML IV SOLN
INTRAVENOUS | Status: AC
  Filled 2017-12-22: qty 5

## 2017-12-22 MED ORDER — SUGAMMADEX SODIUM 500 MG/5ML IV SOLN
INTRAVENOUS | Status: DC | PRN
  Administered 2017-12-22: 400 mg via INTRAVENOUS

## 2017-12-22 MED ORDER — LIDOCAINE HCL (PF) 2 % IJ SOLN
INTRAMUSCULAR | Status: AC
  Filled 2017-12-22: qty 10

## 2017-12-22 MED ORDER — PROPOFOL 10 MG/ML IV BOLUS
INTRAVENOUS | Status: DC | PRN
  Administered 2017-12-22: 180 mg via INTRAVENOUS

## 2017-12-22 MED ORDER — ONDANSETRON HCL 4 MG/2ML IJ SOLN
INTRAMUSCULAR | Status: AC
  Filled 2017-12-22: qty 2

## 2017-12-22 MED ORDER — LIDOCAINE HCL (PF) 1 % IJ SOLN
INTRAMUSCULAR | Status: AC
  Filled 2017-12-22: qty 30

## 2017-12-22 MED ORDER — ONDANSETRON HCL 4 MG/2ML IJ SOLN
INTRAMUSCULAR | Status: DC | PRN
  Administered 2017-12-22: 4 mg via INTRAVENOUS

## 2017-12-22 MED ORDER — CEFAZOLIN SODIUM-DEXTROSE 2-3 GM-%(50ML) IV SOLR
INTRAVENOUS | Status: DC | PRN
  Administered 2017-12-22: 2 g via INTRAVENOUS

## 2017-12-22 MED ORDER — OXYCODONE-ACETAMINOPHEN 5-325 MG PO TABS
1.0000 | ORAL_TABLET | Freq: Once | ORAL | Status: AC
  Administered 2017-12-22: 1 via ORAL

## 2017-12-22 SURGICAL SUPPLY — 36 items
APPLIER CLIP ROT 10 11.4 M/L (STAPLE) ×3
CHLORAPREP W/TINT 26ML (MISCELLANEOUS) ×3 IMPLANT
CLIP APPLIE ROT 10 11.4 M/L (STAPLE) ×1 IMPLANT
COVER WAND RF STERILE (DRAPES) ×3 IMPLANT
DECANTER SPIKE VIAL GLASS SM (MISCELLANEOUS) ×6 IMPLANT
DERMABOND ADVANCED (GAUZE/BANDAGES/DRESSINGS) ×2
DERMABOND ADVANCED .7 DNX12 (GAUZE/BANDAGES/DRESSINGS) ×1 IMPLANT
DRESSING SURGICEL FIBRLLR 1X2 (HEMOSTASIS) IMPLANT
DRSG SURGICEL FIBRILLAR 1X2 (HEMOSTASIS)
ELECT REM PT RETURN 9FT ADLT (ELECTROSURGICAL) ×3
ELECTRODE REM PT RTRN 9FT ADLT (ELECTROSURGICAL) ×1 IMPLANT
GLOVE BIO SURGEON STRL SZ7 (GLOVE) ×3 IMPLANT
GLOVE BIOGEL PI IND STRL 7.5 (GLOVE) ×1 IMPLANT
GLOVE BIOGEL PI INDICATOR 7.5 (GLOVE) ×2
GOWN STRL REUS W/ TWL LRG LVL3 (GOWN DISPOSABLE) ×3 IMPLANT
GOWN STRL REUS W/TWL LRG LVL3 (GOWN DISPOSABLE) ×6
GRASPER SUT TROCAR 14GX15 (MISCELLANEOUS) ×3 IMPLANT
IRRIGATION STRYKERFLOW (MISCELLANEOUS) IMPLANT
IRRIGATOR STRYKERFLOW (MISCELLANEOUS)
IV NS 1000ML (IV SOLUTION) ×2
IV NS 1000ML BAXH (IV SOLUTION) ×1 IMPLANT
KIT TURNOVER KIT A (KITS) ×3 IMPLANT
NEEDLE HYPO 22GX1.5 SAFETY (NEEDLE) ×3 IMPLANT
NEEDLE INSUFFLATION 14GA 120MM (NEEDLE) ×3 IMPLANT
NS IRRIG 1000ML POUR BTL (IV SOLUTION) ×3 IMPLANT
PACK LAP CHOLECYSTECTOMY (MISCELLANEOUS) ×3 IMPLANT
PORT ACCESS TROCAR AIRSEAL 12 (TROCAR) ×1 IMPLANT
PORT ACCESS TROCAR AIRSEAL 5M (TROCAR) ×2
POUCH SPECIMEN RETRIEVAL 10MM (ENDOMECHANICALS) ×3 IMPLANT
SCISSORS METZENBAUM CVD 33 (INSTRUMENTS) ×3 IMPLANT
SET TRI-LUMEN FLTR TB AIRSEAL (TUBING) ×3 IMPLANT
SLEEVE ENDOPATH XCEL 5M (ENDOMECHANICALS) ×6 IMPLANT
SUT MNCRL AB 4-0 PS2 18 (SUTURE) ×3 IMPLANT
SUT VICRYL 0 UR6 27IN ABS (SUTURE) ×3 IMPLANT
SUT VICRYL AB 3-0 FS1 BRD 27IN (SUTURE) ×3 IMPLANT
TROCAR XCEL NON-BLD 5MMX100MML (ENDOMECHANICALS) ×3 IMPLANT

## 2017-12-22 NOTE — Discharge Instructions (Signed)
In addition to included general post-operative instructions for Laparoscopic Cholecystectomy,  Diet: Resume home heart healthy diet (may prefer to start with low fat foods as discussed).   Activity: No heavy lifting >20 pounds (children, pets, laundry, garbage) or strenuous activity until follow-up, but light activity and walking are encouraged. Do not drive or drink alcohol if taking narcotic pain medications.  Wound care: Remove dressing in 2 days unless otherwise instructed. Once dressing removed, 2 days after surgery (Sunday, 11/17), you may shower/get incision wet with soapy water and pat dry (do not rub incisions), but no baths or submerging incision underwater until follow-up.   Medications: Resume all home medications. For mild to moderate pain: acetaminophen (Tylenol) or ibuprofen/naproxen (if no kidney disease). Combining Tylenol with alcohol can substantially increase your risk of causing liver disease. Narcotic pain medications, if prescribed, can be used for severe pain, though may cause nausea, constipation, and drowsiness. Do not combine Tylenol and Percocet (or similar) within a 6 hour period as Percocet (and similar) contain(s) Tylenol. If you do not need the narcotic pain medication, you do not need to fill the prescription.  Call office [phone] at any time if any questions, worsening pain, fevers/chills, bleeding, drainage from incision site, or other concerns.

## 2017-12-22 NOTE — Anesthesia Procedure Notes (Signed)
Procedure Name: Intubation Date/Time: 12/22/2017 3:43 PM Performed by: Jonna Clark, CRNA Pre-anesthesia Checklist: Patient identified, Patient being monitored, Timeout performed, Emergency Drugs available and Suction available Patient Re-evaluated:Patient Re-evaluated prior to induction Oxygen Delivery Method: Circle system utilized Preoxygenation: Pre-oxygenation with 100% oxygen Induction Type: IV induction Ventilation: Mask ventilation without difficulty Laryngoscope Size: Mac and 3 Grade View: Grade I Tube type: Oral Tube size: 7.0 mm Number of attempts: 1 Airway Equipment and Method: Stylet Placement Confirmation: ETT inserted through vocal cords under direct vision,  positive ETCO2 and breath sounds checked- equal and bilateral Secured at: 21 cm Tube secured with: Tape Dental Injury: Teeth and Oropharynx as per pre-operative assessment

## 2017-12-22 NOTE — Transfer of Care (Signed)
Immediate Anesthesia Transfer of Care Note  Patient: Tiffany Andrews  Procedure(s) Performed: Procedure(s): LAPAROSCOPIC CHOLECYSTECTOMY (N/A)  Patient Location: PACU  Anesthesia Type:General  Level of Consciousness: sedated  Airway & Oxygen Therapy: Patient Spontanous Breathing and Patient connected to face mask oxygen  Post-op Assessment: Report given to RN and Post -op Vital signs reviewed and stable  Post vital signs: Reviewed and stable  Last Vitals:  Vitals:   12/22/17 1337 12/22/17 1712  BP: 121/77 (!) 143/89  Pulse: 69 (!) 102  Resp: 16 19  Temp: (!) 36.2 C 36.5 C  SpO2: 100% 100%    Complications: No apparent anesthesia complications

## 2017-12-22 NOTE — Anesthesia Preprocedure Evaluation (Signed)
Anesthesia Evaluation  Patient identified by MRN, date of birth, ID band Patient awake    Reviewed: Allergy & Precautions, NPO status , Patient's Chart, lab work & pertinent test results, reviewed documented beta blocker date and time   History of Anesthesia Complications (+) history of anesthetic complications  Airway Mallampati: III  TM Distance: >3 FB     Dental  (+) Chipped   Pulmonary asthma ,           Cardiovascular      Neuro/Psych  Headaches, Anxiety    GI/Hepatic GERD  Controlled,  Endo/Other    Renal/GU      Musculoskeletal   Abdominal   Peds  Hematology   Anesthesia Other Findings Obese.  Reproductive/Obstetrics                             Anesthesia Physical Anesthesia Plan  ASA: III  Anesthesia Plan: General   Post-op Pain Management:    Induction: Intravenous  PONV Risk Score and Plan:   Airway Management Planned: Oral ETT  Additional Equipment:   Intra-op Plan:   Post-operative Plan:   Informed Consent: I have reviewed the patients History and Physical, chart, labs and discussed the procedure including the risks, benefits and alternatives for the proposed anesthesia with the patient or authorized representative who has indicated his/her understanding and acceptance.     Plan Discussed with: CRNA  Anesthesia Plan Comments:         Anesthesia Quick Evaluation

## 2017-12-22 NOTE — Interval H&P Note (Signed)
History and Physical Interval Note:  12/22/2017 3:14 PM  Tiffany Andrews  has presented today for surgery, with the diagnosis of cholelithiasis  The various methods of treatment have been discussed with the patient and family. After consideration of risks, benefits and other options for treatment, the patient has consented to  Procedure(s): LAPAROSCOPIC CHOLECYSTECTOMY (N/A) as a surgical intervention .  The patient's history has been reviewed, patient examined, no change in status, stable for surgery.  I have reviewed the patient's chart and labs.  Questions were answered to the patient's satisfaction.     Ancil LinseyJason Evan Davis

## 2017-12-22 NOTE — Anesthesia Post-op Follow-up Note (Signed)
Anesthesia QCDR form completed.        

## 2017-12-22 NOTE — Op Note (Signed)
SURGICAL OPERATIVE REPORT   DATE OF PROCEDURE: 12/22/2017  ATTENDING Surgeon(s): Ancil Linseyavis,  Evan, MD  ANESTHESIA: GETA  PRE-OPERATIVE DIAGNOSIS: Symptomatic Cholelithiasis (K80.20)  POST-OPERATIVE DIAGNOSIS: Symptomatic Cholelithiasis (K80.20)  PROCEDURE(S): (cpt's: 47562) 1.) Laparoscopic Cholecystectomy  INTRAOPERATIVE FINDINGS: Minimal pericholecystic inflammation with cystic duct and cystic artery clips well-secured, hemostasis at completion of procedure  INTRAOPERATIVE FLUIDS: 800 mL crystalloid   ESTIMATED BLOOD LOSS: Minimal (<30 mL)   URINE OUTPUT: No foley  SPECIMENS: Gallbladder  IMPLANTS: None  DRAINS: None   COMPLICATIONS: None apparent   CONDITION AT COMPLETION: Hemodynamically stable and extubated  DISPOSITION: PACU   INDICATION(S) FOR PROCEDURE:  Patient is a 22 y.o. female who previously presented during her recent pregnancy with RUQ > epigastric abdominal pain and nausea after eating fatty foods in particular. Multiple ultrasounds suggested cholelithiasis without sonographic evidence of cholecystitis. This remained problematic throughout her pregnancy and for which she was [facility] to [facility] early during her third trimester despite dietary changes discussed during her first trimester. Due to 3rd trimester surgical/anesthesia risk, [facility], but when that was not offered, patient requested discharge with a very unfavorable view of her experience. Patient reported her N/V have improved following delivery of her child, but she continued to experience intermittent RUQ pain, worst after fatty foods/meals in particular and requested cholecystectomy. All risks, benefits, and alternatives to above elective procedures were discussed with the patient, who elected to proceed, and informed consent was accordingly obtained at that time.   DETAILS OF PROCEDURE:  Patient was brought to the operating suite and appropriately  identified. General anesthesia was administered along with peri-operative prophylactic IV antibiotics, and endotracheal intubation was performed by anesthesiologist, along with NG/OG tube for gastric decompression. In supine position, operative site was prepped and draped in usual sterile fashion, and following a brief time out, initial 5 mm incision was made in a natural skin crease just above the umbilicus. Fascia was then elevated, and a Verress needle was inserted and its proper position confirmed using aspiration and saline meniscus test.  Upon insufflation of the abdominal cavity with carbon dioxide to a well-tolerated pressure of 12-15 mmHg, 5 mm peri-umbilical port followed by laparoscope were inserted and used to inspect the abdominal cavity and its contents with no injuries from insertion of the first trochar noted. Three additional trocars were inserted, one at the epigastric position (12 mm AirSeal) and two along the Right costal margin (5 mm). The table was then placed in reverse Trendelenburg position with the Right side up. Filmy adhesions between the gallbladder and omentum/duodenum/transverse colon were lysed using combined blunt dissection and selective electrocautery. The apex/dome of the gallbladder was grasped with an atraumatic grasper passed through the lateral port and retracted apically over the liver. The infundibulum was also grasped and retracted, exposing Calot's triangle. The peritoneum overlying the gallbladder infundibulum was incised and dissected free of surrounding peritoneal attachments, revealing the cystic duct and cystic artery, which were clipped twice on the patient side and once on the gallbladder specimen side close to the gallbladder. The gallbladder was then dissected from its peritoneal attachments to the liver using electrocautery, and the gallbladder was placed into a laparoscopic specimen bag and removed from the abdominal cavity via the epigastric port site.  Hemostasis and secure placement of clips were confirmed, and intra-peritoneal cavity was inspected with no additional findings. PMI laparoscopic fascial closure device was then used to re-approximate fascia at the 10 mm epigastric port site.  All ports were  then removed under direct visualization, and abdominal cavity was desuflated. All port sites were irrigated/cleaned, additional local anesthetic was injected at each incision, 3-0 Vicryl was used to re-approximate dermis at 10 mm port site(s), and subcuticular 4-0 Monocryl suture was used to re-approximate skin. Skin was then cleaned, dried, and sterile skin glue was applied. Patient was then safely able to be awakened, extubated, and transferred to PACU for post-operative monitoring and care.   I was present for all aspects of the above procedure, and no operative complications were apparent.

## 2017-12-25 ENCOUNTER — Encounter: Payer: Self-pay | Admitting: Surgery

## 2017-12-26 LAB — SURGICAL PATHOLOGY

## 2017-12-28 NOTE — Anesthesia Postprocedure Evaluation (Signed)
Anesthesia Post Note  Patient: Aerielle M Godbey  Procedure(s) Performed: LAPAROSCOPIC CHOLECYSTECTOMY (N/A )  Patient location during evaluation: PACU Anesthesia Type: General Level of consciousness: awake and alert Pain management: pain level controlled Vital Signs Assessment: post-procedure vital signs reviewed and stable Respiratory status: spontaneous breathing, nonlabored ventilation, respiratory function stable and patient connected to nasal cannula oxygen Cardiovascular status: blood pressure returned to baseline and stable Postop Assessment: no apparent nausea or vomiting Anesthetic complications: no     Last Vitals:  Vitals:   12/22/17 1829 12/22/17 1902  BP: 115/71 121/76  Pulse: 94 90  Resp: 18 (!) 1  Temp: 36.7 C 36.7 C  SpO2: 99%     Last Pain:  Vitals:   12/22/17 1902  TempSrc: Oral  PainSc:                  Yevette EdwardsJames G 

## 2018-01-09 ENCOUNTER — Ambulatory Visit (INDEPENDENT_AMBULATORY_CARE_PROVIDER_SITE_OTHER): Payer: Medicaid Other | Admitting: Surgery

## 2018-01-09 ENCOUNTER — Encounter: Payer: Self-pay | Admitting: Surgery

## 2018-01-09 ENCOUNTER — Other Ambulatory Visit: Payer: Self-pay

## 2018-01-09 VITALS — BP 130/86 | HR 74 | Temp 97.7°F | Ht 67.0 in | Wt 223.0 lb

## 2018-01-09 DIAGNOSIS — K8020 Calculus of gallbladder without cholecystitis without obstruction: Secondary | ICD-10-CM

## 2018-01-09 NOTE — Patient Instructions (Addendum)
Return to return as needed. No lifting over 40 pounds for the next two weeks.The patient is aware to call back for any questions or concerns.

## 2018-01-09 NOTE — Progress Notes (Signed)
Surgical Clinic Progress/Follow-up Note   HPI:  22 y.o. Female presents to clinic for post-op follow-up 18 Days s/p laparoscopic cholecystectomy Earlene Plater(, 12/22/2017) for symptomatic cholelithiasis, particularly throughout her recent pregnancy. Patient says she feels "better than [she has] for many months", reports complete resolution of pre-operative pain and has been tolerating regular diet with +flatus and normal BM's, denies N/V, fever/chills, CP, or SOB.  Review of Systems:  Constitutional: denies fever/chills  Respiratory: denies shortness of breath, wheezing  Cardiovascular: denies chest pain, palpitations  Gastrointestinal: abdominal pain, N/V, and bowel function as per interval history Skin: Denies any other rashes or skin discolorations except post-surgical wounds as per interval history  Vital Signs:  BP 130/86   Pulse 74   Temp 97.7 F (36.5 C) (Skin)   Ht 5\' 7"  (1.702 m)   Wt 223 lb (101.2 kg)   LMP 12/19/2017 (Exact Date)   SpO2 98%   BMI 34.93 kg/m    Physical Exam:  Constitutional:  -- Obese body habitus  -- Awake, alert, and oriented x3  Pulmonary:  -- No crackles -- Equal breath sounds bilaterally -- Breathing non-labored at rest Cardiovascular:  -- S1, S2 present  -- No pericardial rubs  Gastrointestinal:  -- Soft and non-distended, non-tender to palpation, no guarding/rebound tenderness -- Post-surgical incisions all well-approximated without any peri-incisional erythema or drainage -- No abdominal masses appreciated, pulsatile or otherwise  Musculoskeletal / Integumentary:  -- Wounds or skin discoloration: None appreciated except post-surgical incisions as described above (GI) -- Extremities: B/L UE and LE FROM, hands and feet warm   Imaging: No new pertinent imaging available for review  Assessment:  22 y.o. yo Female with a problem list including...  Patient Active Problem List   Diagnosis Date Noted  . Breech presentation 09/27/2017  . S/P  cesarean section 09/27/2017  . Poor weight gain of pregnancy, third trimester 08/08/2017  . Calculus of gallbladder without cholecystitis without obstruction 05/12/2017  . Obesity (BMI 30.0-34.9) 04/26/2017    presents to clinic for post-op follow-up evaluation, doing well 18 Days s/p laparoscopic cholecystectomy Earlene Plater(, 12/22/2017) for symptomatic cholelithiasis, particularly throughout her recent pregnancy.  Plan:              - advance diet as tolerated              - okay to submerge incisions under water (baths, swimming) prn             - gradually resume all activities without restrictions over next 2 weeks             - apply sunblock particularly to incisions with sun exposure to reduce pigmentation of scars             - return to clinic as needed, instructed to call office if any questions or concerns  All of the above recommendations were discussed with the patient and patient's mom, and all of patient's and family's questions were answered to their expressed satisfaction.  -- Scherrie GerlachJason E. Earlene Plateravis, MD, RPVI [place]: [facility] General Surgery - Partnering for exceptional care. Office: [phone]

## 2018-02-22 ENCOUNTER — Ambulatory Visit (INDEPENDENT_AMBULATORY_CARE_PROVIDER_SITE_OTHER): Payer: BLUE CROSS/BLUE SHIELD | Admitting: Obstetrics and Gynecology

## 2018-02-22 ENCOUNTER — Encounter: Payer: Self-pay | Admitting: Obstetrics and Gynecology

## 2018-02-22 VITALS — BP 120/89 | HR 107 | Ht 67.0 in | Wt 237.2 lb

## 2018-02-22 DIAGNOSIS — F53 Postpartum depression: Secondary | ICD-10-CM | POA: Diagnosis not present

## 2018-02-22 DIAGNOSIS — O99345 Other mental disorders complicating the puerperium: Principal | ICD-10-CM

## 2018-02-22 DIAGNOSIS — K2100 Gastro-esophageal reflux disease with esophagitis, without bleeding: Secondary | ICD-10-CM

## 2018-02-22 MED ORDER — ESCITALOPRAM OXALATE 10 MG PO TABS: 10.0000 mg | ORAL_TABLET | Freq: Every day | ORAL | 3 refills | Status: DC

## 2018-02-22 MED ORDER — SERTRALINE HCL 50 MG PO TABS: 100.0000 mg | ORAL_TABLET | Freq: Every day | ORAL | 3 refills | Status: DC

## 2018-02-22 MED ORDER — FAMOTIDINE 20 MG PO TABS: 20.0000 mg | ORAL_TABLET | Freq: Two times a day (BID) | ORAL | 2 refills | Status: DC

## 2018-02-22 MED ORDER — HYDROXYZINE PAMOATE 50 MG PO CAPS: 50.0000 mg | ORAL_CAPSULE | Freq: Every day | ORAL | 3 refills | Status: DC

## 2018-02-22 NOTE — Progress Notes (Signed)
    GYNECOLOGY PROGRESS NOTE  Subjective:    Patient ID: Tiffany Andrews, female    DOB: [birth year], 23 y.o.   MRN: [id number]  HPI  Patient is a 23 y.o. G68P1001 female who presents for discussion of depression symptoms.  She is currently on Zoloft 50 mg for postpartum depression, initiated in September.  She notes that over the past several weeks her symptoms appear to be getting worse.She doesn't feel like her medication is working anymore.   She is noting increased anxiety, sadness, appetite changes (over-eating), tearfulness, and anhedonia.  She also endorses suicidal ideation over the past week (i.e. "accidental" car collision) however has had no plans to carry out action. She does still interact with her baby, and notes this is they only thing she still finds joy in. She reports having a good support system at home. EPDS score was 23 today (up from 11 at last visit in October).   She also requests a refill on her medication for her reflux.   The following portions of the patient's history were reviewed and updated as appropriate: allergies, current medications, past family history, past medical history, past social history, past surgical history and problem list.  Review of Systems Pertinent items noted in HPI and remainder of comprehensive ROS otherwise negative.   Objective:   Blood pressure 120/89, pulse (!) 107, height 5\' 7"  (1.702 m), weight 237 lb 3.2 oz (107.6 kg), last menstrual period 02/08/2018, not currently breastfeeding. General appearance: alert and appears sad and tearful Psychological: normal speech, mildly blunted affect, depressed mood. Normal thought process.   Assessment:   Postpartum depression GERD  Plan:   - Discussion had with patient regarding worsening symptoms. She is currently on 50 mg dosing, will increase to 100 mg.  I also recommend that she initiate Lexapro 10 mg to help with mood swings.  Additionally, I offered patient the option of counseling, or  even inpatient IV therapy with [facility].  Patient declined admission or therapy at this time. Cites that she is ok to talk with friend and family, but is very leery of discussing issues with a stranger.  Also offered support that if patient desired, she could return to GYN care for further counseling. Will f/u in 2-3 weeks to reassess symptoms. Also given information on depression and suicide hotline and other resources.  - GERD symptoms, currently on Pepcid. Given refill on medication.    A total of 15 minutes were spent face-to-face with the patient during this encounter and over half of that time involved counseling and coordination of care.  Hildred Laser, MD [facility]

## 2018-02-22 NOTE — Progress Notes (Signed)
Pt is present today for medication check. Pt is currently taking Zoloft 50mg  for postpartum depression. Pt stated that the medication is not working for her anymore and pt stated that she feels really bad. EPDS=23.

## 2018-02-22 NOTE — Patient Instructions (Signed)
Perinatal Depression When a woman feels excessive sadness, anger, or anxiety during pregnancy or during the first 12 months after she gives birth, she has a condition called perinatal depression. Depression can interfere with work, school, relationships, and other everyday activities. If it is not managed properly, it can also cause problems in the mother and her baby. Sometimes, perinatal depression is left untreated because symptoms are thought to be normal mood swings during and right after pregnancy. If you have symptoms of depression, it is important to talk with your health care provider. What are the causes? The exact cause of this condition is not known. Hormonal changes during and after pregnancy may play a role in causing perinatal depression. What increases the risk? You are more likely to develop this condition if:  You have a personal or family history of depression, anxiety, or mood disorders.  You experience a stressful life event during pregnancy, such as the death of a loved one.  You have a lot of regular life stress.  You do not have support from family members or loved ones, or you are in an abusive relationship. What are the signs or symptoms? Symptoms of this condition include:  Feeling sad or hopeless.  Feelings of guilt.  Feeling irritable or overwhelmed.  Changes in your appetite.  Lack of energy or motivation.  Sleep problems.  Difficulty concentrating or completing tasks.  Loss of interest in hobbies or relationships.  Headaches or stomach problems that do not go away. How is this diagnosed? This condition is diagnosed based on a physical exam and mental evaluation. In some cases, your health care provider may use a depression screening tool. These tools include a list of questions that can help a health care provider diagnose depression. Your health care provider may refer you to a mental health expert who specializes in depression. How is this  treated? This condition may be treated with:  Medicines. Your health care provider will only give you medicines that have been proven safe for pregnancy and breastfeeding.  Talk therapy with a mental health professional to help change your patterns of thinking (cognitive behavioral therapy).  Support groups.  Brain stimulation or light therapies.  Stress reduction therapies, such as mindfulness. Follow these instructions at home: Lifestyle  Do not use any products that contain nicotine or tobacco, such as cigarettes and e-cigarettes. If you need help quitting, ask your health care provider.  Do not use alcohol when you are pregnant. After your baby is born, limit alcohol intake to no more than 1 drink a day. One drink equals 12 oz of beer, 5 oz of wine, or 1 oz of hard liquor.  Consider joining a support group for new mothers. Ask your health care provider for recommendations.  Take good care of yourself. Make sure you: ? Get plenty of sleep. If you are having trouble sleeping, talk with your health care provider. ? Eat a healthy diet. This includes plenty of fruits and vegetables, whole grains, and lean proteins. ? Exercise regularly, as told by your health care provider. Ask your health care provider what exercises are safe for you. General instructions  Take over-the-counter and prescription medicines only as told by your health care provider.  Talk with your partner or family members about your feelings during pregnancy. Share any concerns or anxieties that you may have.  Ask for help with tasks or chores when you need it. Ask friends and family members to provide meals, watch your children, or help with   cleaning.  Keep all follow-up visits as told by your health care provider. This is important. Contact a health care provider if:  You (or people close to you) notice that you have any symptoms of depression.  You have depression and your symptoms get worse.  You  experience side effects from medicines, such as nausea or sleep problems. Get help right away if:  You feel like hurting yourself, your baby, or someone else. If you ever feel like you may hurt yourself or others, or have thoughts about taking your own life, get help right away. You can go to your nearest emergency department or call:  Your local emergency services (911 in the [place]  A suicide crisis helpline, such as the National Suicide Prevention Lifeline at (978)194-0832. This is open 24 hours a day. Summary  Perinatal depression is when a woman feels excessive sadness, anger, or anxiety during pregnancy or during the first 12 months after she gives birth.  If perinatal depression is not treated, it can lead to health problems for the mother and her baby.  This condition is treated with medicines, talk therapy, stress reduction therapies, or a combination of two or more treatments.  Talk with your partner or family members about your feelings. Do not be afraid to ask for help. This information is not intended to replace advice given to you by your health care provider. Make sure you discuss any questions you have with your health care provider. Document Released: 03/23/2016 Document Revised: 03/23/2016 Document Reviewed: 03/23/2016 Elsevier Interactive Patient Education  2019 [employer].   Suicidal Feelings: How to Help Yourself Suicide is when you end your own life. There are many things you can do to help yourself feel better when struggling with these feelings. Many services and people are available to support you and others who struggle with similar feelings. If you ever feel like you may hurt yourself or others, or have thoughts about taking your own life, get help right away. To get help:  Call your local emergency services (911 in the [place]  Go to your nearest emergency department.  Call a suicide hotline to speak with a trained counselor. The following suicide  hotlines are available in the [place]: ? 1-800-273-TALK (305)792-3760). ? 1-800-SUICIDE 812-825-0535). ? (651) 155-1187. This is a hotline for Spanish speakers. ? (660) 779-4168. This is a hotline for TTY users. ? 1-866-4-U-TREVOR 9367582560). This is a hotline for lesbian, gay, bisexual, transgender, or questioning youth. ? For a list of hotlines in [place], visit ItCheaper.dk.html  Contact a crisis center or a local suicide prevention center. To find a crisis center or suicide prevention center: ? Call your local hospital, clinic, community service organization, mental health center, social service provider, or health department. Ask for help with connecting to a crisis center. ? For a list of crisis centers in the [place], visit: suicidepreventionlifeline.org ? For a list of crisis centers in [place], visit: suicideprevention.ca How to help yourself feel better   Promise yourself that you will not do anything extreme when you have suicidal feelings. Remember, there is hope. Many people have gotten through suicidal thoughts and feelings, and you can too. If you have had these feelings before, remind yourself that you can get through them again.  Let family, friends, teachers, or counselors know how you are feeling. Try not to separate yourself from those who care about you and want to help you. Talk with someone every day, even if you do not feel sociable. Face-to-face conversation  is best to help them understand your feelings.  Contact a mental health care provider and work with this person regularly.  Make a safety plan that you can follow during a crisis. Include phone numbers of suicide prevention hotlines, mental health professionals, and trusted friends and family members you can call during an emergency. Save these numbers on your phone.  If you are thinking of taking a lot of medicine, give your medicine to someone  who can give it to you as prescribed. If you are on antidepressants and are concerned you will overdose, tell your health care provider so that he or she can give you safer medicines.  Try to stick to your routines. Follow a schedule every day. Make self-care a priority.  Make a list of realistic goals, and cross them off when you achieve them. Accomplishments can give you a sense of worth.  Wait until you are feeling better before doing things that you find difficult or unpleasant.  Do things that you have always enjoyed to take your mind off your feelings. Try reading a book, or listening to or playing music. Spending time outside, in nature, may help you feel better. Follow these instructions at home:   Visit your primary health care provider every year for a checkup.  Work with a mental health care provider as needed.  Eat a well-balanced diet, and eat regular meals.  Get plenty of rest.  Exercise if you are able. Just 30 minutes of exercise each day can help you feel better.  Take over-the-counter and prescription medicines only as told by your health care provider. Ask your mental health care provider about the possible side effects of any medicines you are taking.  Do not use alcohol or drugs, and remove these substances from your home.  Remove weapons, poisons, knives, and other deadly items from your home. General recommendations  Keep your living space well lit.  When you are feeling well, write yourself a letter with tips and support that you can read when you are not feeling well.  Remember that life's difficulties can be sorted out with help. Conditions can be treated, and you can learn behaviors and ways of thinking that will help you. Where to find more information  National Suicide Prevention Lifeline: www.suicidepreventionlifeline.org  Hopeline: www.hopeline.com  [employer] for Suicide Prevention: https://www.ayers.com/  [employer] (for lesbian, gay,  bisexual, transgender, or questioning youth): www.thetrevorproject.org Contact a health care provider if:  You feel as though you are a burden to others.  You feel agitated, angry, vengeful, or have extreme mood swings.  You have withdrawn from family and friends. Get help right away if:  You are talking about suicide or wishing to die.  You start making plans for how to commit suicide.  You feel that you have no reason to live.  You start making plans for putting your affairs in order, saying goodbye, or giving your possessions away.  You feel guilt, shame, or unbearable pain, and it seems like there is no way out.  You are frequently using drugs or alcohol.  You are engaging in risky behaviors that could lead to death. If you have any of these symptoms, get help right away. Call emergency services, go to your nearest emergency department or crisis center, or call a suicide crisis helpline. Summary  Suicide is when you take your own life.  Promise yourself that you will not do anything extreme when you have suicidal feelings.  Let family, friends, teachers,  or counselors know how you are feeling.  Get help right away if you feel as though life is getting too tough to handle and you are thinking about suicide. This information is not intended to replace advice given to you by your health care provider. Make sure you discuss any questions you have with your health care provider. Document Released: 07/31/2002 Document Revised: 09/06/2016 Document Reviewed: 09/06/2016 Elsevier Interactive Patient Education  2019 [employer]

## 2018-02-23 ENCOUNTER — Encounter: Payer: Self-pay | Admitting: Obstetrics and Gynecology

## 2018-03-15 ENCOUNTER — Encounter: Payer: BLUE CROSS/BLUE SHIELD | Admitting: Obstetrics and Gynecology

## 2018-04-11 ENCOUNTER — Encounter: Payer: Self-pay | Admitting: Obstetrics and Gynecology

## 2018-04-11 ENCOUNTER — Ambulatory Visit (INDEPENDENT_AMBULATORY_CARE_PROVIDER_SITE_OTHER): Payer: Medicaid Other | Admitting: Obstetrics and Gynecology

## 2018-04-11 VITALS — BP 121/83 | HR 99 | Ht 67.0 in | Wt 244.3 lb

## 2018-04-11 DIAGNOSIS — O99345 Other mental disorders complicating the puerperium: Secondary | ICD-10-CM | POA: Diagnosis not present

## 2018-04-11 DIAGNOSIS — F53 Postpartum depression: Secondary | ICD-10-CM

## 2018-04-11 DIAGNOSIS — G479 Sleep disorder, unspecified: Secondary | ICD-10-CM | POA: Diagnosis not present

## 2018-04-11 DIAGNOSIS — R45851 Suicidal ideations: Secondary | ICD-10-CM

## 2018-04-11 NOTE — Progress Notes (Signed)
Pt present today for a follow up from depression and med check after delivery of baby. Pt stated that she is taking her medication regular as directed. EPDS=19. GAD-7=11.

## 2018-04-11 NOTE — Progress Notes (Signed)
Subjective:     Tiffany Andrews is a 23 y.o. female here for follow-up on postpartum depression and medication check.     Patient has history of postpartum depression with Zoloft dose increased to 100mg  at last visit, also on Lexapro 10mg  daily. EPDS score 19 and GAD-7 score 11 at today's visit. Patient states that she is not taking her medications for 2-3 days per week. She reports that her depressive symptoms are approximately 50% improved; however, she is still having suicidal ideations 1-2 times every other week. She previously had ~6-7 episodes of suicidal ideation. She states that she doesn't have a specific plan to harm herself, but in the moment of feeling suicidal she thinks "I'll overdose or I'll wreck my car." The plan changes every time she says. Patient reports that she is having a hard time falling asleep. She typically lies down at midnight to go to sleep but doesn't fall asleep until 0200. She has only taken her Vistaril three times because it is causing severe headaches. Patient states that she is open to speaking with a counselor, but does not want to be admitted to the hospital.   Review of Systems Pertinent items are noted in HPI.    Objective:    BP 121/83   Pulse 99   Ht 5\' 7"  (1.702 m)   Wt 244 lb 4.8 oz (110.8 kg)   BMI 38.26 kg/m  General appearance: alert, cooperative and no distress  Psychological: normal speech, depressed mood, suicidal ideations.    Assessment:   Postpartum depression Suicidal ideation Complaint of insomnia   Plan:   1. Continue Lexapro 10mg  and Zoloft 100mg  daily.  2. Discuss strategies to increase medication compliance, such as setting an alarm and taking at the same time every night along with other medications.  3. Discontinue Vistaril d/t side effect of severe headache.  4. Recommend OTC melatonin for sleep disturbance.  5. Refer patient to [facility] services. Given and discussed information regarding address, phone  numbers, and hours of operation for walk-in clinics of both clinics.  6. Review and have patient verbalize and sign a Charity fundraiser.  7. RTC 2-3 weeks to reassess symptoms.  8. Also given information on depression and suicide hotline.

## 2018-04-11 NOTE — Patient Instructions (Signed)
Suicidal Feelings: How to Help Yourself °Suicide is when you end your own life. There are many things you can do to help yourself feel better when struggling with these feelings. Many services and people are available to support you and others who struggle with similar feelings.  °If you ever feel like you may hurt yourself or others, or have thoughts about taking your own life, get help right away. To get help: °· Call your local emergency services (911 in the [place] °· Go to your nearest emergency department. °· Call a suicide hotline to speak with a trained counselor. The following suicide hotlines are available in the [place]: °? 1-800-273-TALK (1-800-273-8255). °? 1-800-SUICIDE (1-800-784-2433). °? 1-888-628-9454. This is a hotline for Spanish speakers. °? 1-800-799-4889. This is a hotline for TTY users. °? 1-866-4-U-TREVOR (1-866-488-7386). This is a hotline for lesbian, gay, bisexual, transgender, or questioning youth. °? For a list of hotlines in [place], visit www.suicide.org/hotlines/international/[place]-suicide-hotlines.html °· Contact a crisis center or a local suicide prevention center. To find a crisis center or suicide prevention center: °? Call your local hospital, clinic, community service organization, mental health center, social service provider, or health department. Ask for help with connecting to a crisis center. °? For a list of crisis centers in the [place], visit: suicidepreventionlifeline.org °? For a list of crisis centers in [place], visit: suicideprevention.ca °How to help yourself feel better ° °· Promise yourself that you will not do anything extreme when you have suicidal feelings. Remember, there is hope. Many people have gotten through suicidal thoughts and feelings, and you can too. If you have had these feelings before, remind yourself that you can get through them again. °· Let family, friends, teachers, or counselors know how you are feeling. Try not to separate  yourself from those who care about you and want to help you. Talk with someone every day, even if you do not feel sociable. Face-to-face conversation is best to help them understand your feelings. °· Contact a mental health care provider and work with this person regularly. °· Make a safety plan that you can follow during a crisis. Include phone numbers of suicide prevention hotlines, mental health professionals, and trusted friends and family members you can call during an emergency. Save these numbers on your phone. °· If you are thinking of taking a lot of medicine, give your medicine to someone who can give it to you as prescribed. If you are on antidepressants and are concerned you will overdose, tell your health care provider so that he or she can give you safer medicines. °· Try to stick to your routines. Follow a schedule every day. Make self-care a priority. °· Make a list of realistic goals, and cross them off when you achieve them. Accomplishments can give you a sense of worth. °· Wait until you are feeling better before doing things that you find difficult or unpleasant. °· Do things that you have always enjoyed to take your mind off your feelings. Try reading a book, or listening to or playing music. Spending time outside, in nature, may help you feel better. °Follow these instructions at home: ° °· Visit your primary health care provider every year for a checkup. °· Work with a mental health care provider as needed. °· Eat a well-balanced diet, and eat regular meals. °· Get plenty of rest. °· Exercise if you are able. Just 30 minutes of exercise each day can help you feel better. °· Take over-the-counter and prescription medicines only as told by   your health care provider. Ask your mental health care provider about the possible side effects of any medicines you are taking. °· Do not use alcohol or drugs, and remove these substances from your home. °· Remove weapons, poisons, knives, and other deadly  items from your home. °General recommendations °· Keep your living space well lit. °· When you are feeling well, write yourself a letter with tips and support that you can read when you are not feeling well. °· Remember that life's difficulties can be sorted out with help. Conditions can be treated, and you can learn behaviors and ways of thinking that will help you. °Where to find more information °· National Suicide Prevention Lifeline: www.suicidepreventionlifeline.org °· Hopeline: www.hopeline.com °· American Foundation for Suicide Prevention: www.afsp.org °· The [employer] (for lesbian, gay, bisexual, transgender, or questioning youth): www.thetrevorproject.org °Contact a health care provider if: °· You feel as though you are a burden to others. °· You feel agitated, angry, vengeful, or have extreme mood swings. °· You have withdrawn from family and friends. °Get help right away if: °· You are talking about suicide or wishing to die. °· You start making plans for how to commit suicide. °· You feel that you have no reason to live. °· You start making plans for putting your affairs in order, saying goodbye, or giving your possessions away. °· You feel guilt, shame, or unbearable pain, and it seems like there is no way out. °· You are frequently using drugs or alcohol. °· You are engaging in risky behaviors that could lead to death. °If you have any of these symptoms, get help right away. Call emergency services, go to your nearest emergency department or crisis center, or call a suicide crisis helpline. °Summary °· Suicide is when you take your own life. °· Promise yourself that you will not do anything extreme when you have suicidal feelings. °· Let family, friends, teachers, or counselors know how you are feeling. °· Get help right away if you feel as though life is getting too tough to handle and you are thinking about suicide. °This information is not intended to replace advice given to you by your health  care provider. Make sure you discuss any questions you have with your health care provider. °Document Released: 07/31/2002 Document Revised: 09/06/2016 Document Reviewed: 09/06/2016 °Elsevier Interactive Patient Education © 2019 [employer]vier Inc. ° °

## 2018-04-11 NOTE — Progress Notes (Signed)
    GYNECOLOGY PROGRESS NOTE  Subjective:    Patient ID: Tiffany Andrews, female    DOB: 10/11/1995, 23 y.o.   MRN: [id number]  HPI  Patient is a 24 y.o. G39P1001 female who presents for f/u of postpartum depression and medication check. Last visit her Zoloft was increased to 100 mg and she was initiated on Lexapro 10 mg for better control of her anxiety. She notes that she is still experiencing symptoms but overall is getting a little better. However she does report that she is not taking her medication consistently (reports missing doses 2-3 days per week).  EPDS score was 19 (down from 23) and GAD-7 score 11 on today's visit.  She is still noting some suicidal ideation, however thoughts are occurring much less (now down to 1-2 episodes every other week, previously 6-7). She denies having a specific plan. She also denies having any homicidal ideation. She is still reporting some difficulty sleeping. Did not like the Vistaril as it made her have bad headaches  The following portions of the patient's history were reviewed and updated as appropriate: allergies, current medications, past family history, past medical history, past social history, past surgical history and problem list.  Review of Systems Pertinent items noted in HPI and remainder of comprehensive ROS otherwise negative.   Objective:   Blood pressure 121/83, pulse 99, height 5\' 7"  (1.702 m), weight 244 lb 4.8 oz (110.8 kg), not currently breastfeeding. General appearance: alert and no distress Psychological: normal thought content, normal speech, normal appearance   Assessment:   Postpartum depression Suicidal ideation Sleep disturbances  Plan:   - Strongly encouraged patient to take medications every day as prescribed. Notes non-compliance as she states that she just forgets as she is more focused on taking care of her baby. Patient's mother-in-law also present, notes that she will work to ensure that patient takes her  medications daily.  Also had patient sign a Energy manager. Given information for suicide hotline, as well as information for local walk-in clinics such as [facility] and [facility] services.  Has declined counseling in the past, but after further discussion notes that she may now consider it and will think about it.  Will refer if desired.  - Advised on use of Melatonin, aromatherapy, Benadryl or Unisom for sleep issues as she did not like the effects of the Vistaril. Is worried about prescription medications as she is worried she will not hear her baby during the night.  - RTC in 3-4 weeks to f/u depression symptoms.    A total of 15 minutes were spent face-to-face with the patient during this encounter and over half of that time dealt with counseling and coordination of care.   Hildred Laser, MD [facility]

## 2018-04-12 DIAGNOSIS — R45851 Suicidal ideations: Secondary | ICD-10-CM | POA: Insufficient documentation

## 2018-04-12 DIAGNOSIS — G479 Sleep disorder, unspecified: Secondary | ICD-10-CM | POA: Insufficient documentation

## 2018-04-12 DIAGNOSIS — O99345 Other mental disorders complicating the puerperium: Principal | ICD-10-CM

## 2018-04-12 DIAGNOSIS — F53 Postpartum depression: Secondary | ICD-10-CM | POA: Insufficient documentation

## 2018-05-11 ENCOUNTER — Encounter: Payer: Self-pay | Admitting: Obstetrics and Gynecology

## 2018-05-11 ENCOUNTER — Other Ambulatory Visit: Payer: Self-pay

## 2018-05-11 ENCOUNTER — Ambulatory Visit (INDEPENDENT_AMBULATORY_CARE_PROVIDER_SITE_OTHER): Payer: Medicaid Other | Admitting: Obstetrics and Gynecology

## 2018-05-11 VITALS — Ht 65.0 in

## 2018-05-11 DIAGNOSIS — F418 Other specified anxiety disorders: Secondary | ICD-10-CM | POA: Diagnosis not present

## 2018-05-11 DIAGNOSIS — F53 Postpartum depression: Secondary | ICD-10-CM

## 2018-05-11 DIAGNOSIS — N643 Galactorrhea not associated with childbirth: Secondary | ICD-10-CM | POA: Diagnosis not present

## 2018-05-11 DIAGNOSIS — O99345 Other mental disorders complicating the puerperium: Secondary | ICD-10-CM

## 2018-05-11 NOTE — Progress Notes (Signed)
Telephone visit transferred from front desk. CC, vitals, meds, and allergies reviewed and updated. Pt is having a telephone visit due being depressed. EDPS=11. Pt stated that she has been doing well.

## 2018-05-11 NOTE — Progress Notes (Signed)
Virtual Visit via Telephone Note  I connected with Tiffany Andrews on 05/11/18 at 12:17 PM EDT by telephone and verified that I am speaking with the correct person using two identifiers.   I discussed the limitations, risks, security and privacy concerns of performing an evaluation and management service by telephone and the availability of in person appointments. I also discussed with the patient that there may be a patient responsible charge related to this service. The patient expressed understanding and agreed to proceed.   History of Present Illness: Tiffany Andrews is a 23 y.o. G81P1001 female who is following up for medication check and postpartum depression and anxiety.  She is 5 months postpartum, with onset around month 2 of her symptoms. She is currently taking Zoloft 100 mg and Lexapro 10 mg. Patient states that she has noticed an improvement in her symptoms now that she is taking her medication every day as prescribed (at last visit was noting some difficulties with compliance of medications). She denies any SI/HI. EPDS score is 11 today (down from 19).  She is still noting issues with anxiety, waking up almost every other night feeling "panicky".    Additionally, she is still complaining of noting some occasional leaking from the breasts of a milky white discharge. Notes that the discharge is scant, and is usually noted when expressed (although sometimes will happen spontaneously). Is not currently breastfeeding.    Observations/Objective: No vitals taken for today's visit.  Patient's reported weight today is 246 lbs.   Assessment and Plan: 1. Postpartum depression and anxiety - Advised to increase Lexapro dosing to BID to help with night-time symptoms. Continue Zoloft 100 mg daily.  If still no improvement over the next several weeks, advised to call or Mychart, and I can prescribe something different.  2. Galactorrhea - advised on herbal remedies (sage or peppermint tea, peppermint oil  topically) and tight-fitting sports bras for the next several weeks. If no change in symptoms, will check hormone levels (specifically prolactin).   Follow Up Instructions:   Patient to f/u in 3 months for annual exam.  If no improvement anxiety symptoms, to f/u in 3-4 weeks via Mychart or Televisit.   I discussed the assessment and treatment plan with the patient. The patient was provided an opportunity to ask questions and all were answered. The patient agreed with the plan and demonstrated an understanding of the instructions.   The patient was advised to call back or seek an in-person evaluation if the symptoms worsen or if the condition fails to improve as anticipated.  I provided 8 minutes of non-face-to-face time during this encounter.   Hildred Laser, MD  [facility]

## 2018-05-14 ENCOUNTER — Ambulatory Visit
Admission: EM | Admit: 2018-05-14 | Discharge: 2018-05-14 | Disposition: A | Discharge status: Home / Self Care | Payer: Medicaid Other | Attending: Family Medicine | Admitting: Family Medicine

## 2018-05-14 ENCOUNTER — Other Ambulatory Visit: Payer: Self-pay

## 2018-05-14 DIAGNOSIS — S0501XA Injury of conjunctiva and corneal abrasion without foreign body, right eye, initial encounter: Secondary | ICD-10-CM | POA: Diagnosis not present

## 2018-05-14 DIAGNOSIS — J029 Acute pharyngitis, unspecified: Secondary | ICD-10-CM | POA: Diagnosis present

## 2018-05-14 LAB — RAPID STREP SCREEN (MED CTR MEBANE ONLY): Streptococcus, Group A Screen (Direct): NEGATIVE

## 2018-05-14 MED ORDER — MOXIFLOXACIN HCL 0.5 % OP SOLN: 1.0000 [drp] | Freq: Three times a day (TID) | OPHTHALMIC | 0 refills | Status: AC
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
## 2018-05-14 NOTE — Discharge Instructions (Addendum)
Take medication as prescribed. Rest. Drink plenty of fluids.  Avoid rubbing eye.  Continue to monitor.  Over-the-counter antihistamine as needed for sore throat due to potential allergic trigger.  Also please refer to and adhere given COVID-19 information.  Follow up with your primary care physician this week as needed. Return to Urgent care for new or worsening concerns.

## 2018-05-14 NOTE — ED Triage Notes (Signed)
Patient complains of right eye pain that started after cutting childs toenails last night. Patient states that this happened around 1130pm yesterday. Patient states that eye has been draining yellow liquid.

## 2018-05-15 ENCOUNTER — Ambulatory Visit
Admission: EM | Admit: 2018-05-15 | Discharge: 2018-05-15 | Disposition: A | Discharge status: Home / Self Care | Payer: Medicaid Other | Attending: Family Medicine | Admitting: Family Medicine

## 2018-05-15 ENCOUNTER — Other Ambulatory Visit: Payer: Self-pay

## 2018-05-15 DIAGNOSIS — R112 Nausea with vomiting, unspecified: Secondary | ICD-10-CM

## 2018-05-15 DIAGNOSIS — J111 Influenza due to unidentified influenza virus with other respiratory manifestations: Secondary | ICD-10-CM

## 2018-05-15 DIAGNOSIS — H9209 Otalgia, unspecified ear: Secondary | ICD-10-CM

## 2018-05-15 DIAGNOSIS — R509 Fever, unspecified: Secondary | ICD-10-CM | POA: Diagnosis not present

## 2018-05-15 DIAGNOSIS — J029 Acute pharyngitis, unspecified: Secondary | ICD-10-CM | POA: Diagnosis not present

## 2018-05-15 DIAGNOSIS — Z87891 Personal history of nicotine dependence: Secondary | ICD-10-CM

## 2018-05-15 DIAGNOSIS — R69 Illness, unspecified: Secondary | ICD-10-CM

## 2018-05-15 MED ORDER — OSELTAMIVIR PHOSPHATE 75 MG PO CAPS: 75.0000 mg | ORAL_CAPSULE | Freq: Two times a day (BID) | ORAL | 0 refills | Status: DC

## 2018-05-15 NOTE — Discharge Instructions (Signed)
Rest.  Fluids.  Medication as prescribed.  If symptoms persist or you worsen, go to the hospital.  Take care  Dr. Adriana Simas
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
## 2018-05-15 NOTE — ED Triage Notes (Signed)
Patient states that she started running a fever last night and ear pain. Patient states that she had blood in her ears. Patient states that her whole body aches and she started vomiting last night as well. Patient states that she was seen yesterday and took the eye drops and zyrtec yesterday.

## 2018-05-17 LAB — CULTURE, GROUP A STREP (THRC)

## 2018-06-14 ENCOUNTER — Ambulatory Visit (HOSPITAL_COMMUNITY)
Admission: EM | Admit: 2018-06-14 | Discharge: 2018-06-14 | Disposition: A | Discharge status: Home / Self Care | Payer: Medicaid Other

## 2018-07-03 MED ORDER — ALPRAZOLAM 0.5 MG PO TABS: 0.5000 mg | ORAL_TABLET | Freq: Two times a day (BID) | ORAL | 0 refills | Status: DC | PRN

## 2018-07-06 ENCOUNTER — Other Ambulatory Visit: Payer: Self-pay | Admitting: Obstetrics and Gynecology

## 2018-08-14 ENCOUNTER — Telehealth: Payer: Self-pay

## 2018-08-14 NOTE — Patient Instructions (Addendum)
Health Maintenance, Female Adopting a healthy lifestyle and getting preventive care are important in promoting health and wellness. Ask your health care provider about:  The right schedule for you to have regular tests and exams.  Things you can do on your own to prevent diseases and keep yourself healthy. What should I know about diet, weight, and exercise? Eat a healthy diet   Eat a diet that includes plenty of vegetables, fruits, low-fat dairy products, and lean protein.  Do not eat a lot of foods that are high in solid fats, added sugars, or sodium. Maintain a healthy weight Body mass index (BMI) is used to identify weight problems. It estimates body fat based on height and weight. Your health care provider can help determine your BMI and help you achieve or maintain a healthy weight. Get regular exercise Get regular exercise. This is one of the most important things you can do for your health. Most adults should:  Exercise for at least 150 minutes each week. The exercise should increase your heart rate and make you sweat (moderate-intensity exercise).  Do strengthening exercises at least twice a week. This is in addition to the moderate-intensity exercise.  Spend less time sitting. Even light physical activity can be beneficial. Watch cholesterol and blood lipids Have your blood tested for lipids and cholesterol at 22 years of age, then have this test every 5 years. Have your cholesterol levels checked more often if:  Your lipid or cholesterol levels are high.  You are older than 22 years of age.  You are at high risk for heart disease. What should I know about cancer screening? Depending on your health history and family history, you may need to have cancer screening at various ages. This may include screening for:  Breast cancer.  Cervical cancer.  Colorectal cancer.  Skin cancer.  Lung cancer. What should I know about heart disease, diabetes, and high blood  pressure? Blood pressure and heart disease  High blood pressure causes heart disease and increases the risk of stroke. This is more likely to develop in people who have high blood pressure readings, are of African descent, or are overweight.  Have your blood pressure checked: ? Every 3-5 years if you are 18-39 years of age. ? Every year if you are 40 years old or older. Diabetes Have regular diabetes screenings. This checks your fasting blood sugar level. Have the screening done:  Once every three years after age 40 if you are at a normal weight and have a low risk for diabetes.  More often and at a younger age if you are overweight or have a high risk for diabetes. What should I know about preventing infection? Hepatitis B If you have a higher risk for hepatitis B, you should be screened for this virus. Talk with your health care provider to find out if you are at risk for hepatitis B infection. Hepatitis C Testing is recommended for:  Everyone born from 1945 through 1965.  Anyone with known risk factors for hepatitis C. Sexually transmitted infections (STIs)  Get screened for STIs, including gonorrhea and chlamydia, if: ? You are sexually active and are younger than 22 years of age. ? You are older than 22 years of age and your health care provider tells you that you are at risk for this type of infection. ? Your sexual activity has changed since you were last screened, and you are at increased risk for chlamydia or gonorrhea. Ask your health care provider if   you are at risk.  Ask your health care provider about whether you are at high risk for HIV. Your health care provider may recommend a prescription medicine to help prevent HIV infection. If you choose to take medicine to prevent HIV, you should first get tested for HIV. You should then be tested every 3 months for as long as you are taking the medicine. Pregnancy  If you are about to stop having your period (premenopausal) and  you may become pregnant, seek counseling before you get pregnant.  Take 400 to 800 micrograms (mcg) of folic acid every day if you become pregnant.  Ask for birth control (contraception) if you want to prevent pregnancy. Osteoporosis and menopause Osteoporosis is a disease in which the bones lose minerals and strength with aging. This can result in bone fractures. If you are 65 years old or older, or if you are at risk for osteoporosis and fractures, ask your health care provider if you should:  Be screened for bone loss.  Take a calcium or vitamin D supplement to lower your risk of fractures.  Be given hormone replacement therapy (HRT) to treat symptoms of menopause. Follow these instructions at home: Lifestyle  Do not use any products that contain nicotine or tobacco, such as cigarettes, e-cigarettes, and chewing tobacco. If you need help quitting, ask your health care provider.  Do not use street drugs.  Do not share needles.  Ask your health care provider for help if you need support or information about quitting drugs. Alcohol use  Do not drink alcohol if: ? Your health care provider tells you not to drink. ? You are pregnant, may be pregnant, or are planning to become pregnant.  If you drink alcohol: ? Limit how much you use to 0-1 drink a day. ? Limit intake if you are breastfeeding.  Be aware of how much alcohol is in your drink. In the [place] one drink equals one 12 oz bottle of beer (355 mL), one 5 oz glass of wine (148 mL), or one 1 oz glass of hard liquor (44 mL). General instructions  Schedule regular health, dental, and eye exams.  Stay current with your vaccines.  Tell your health care provider if: ? You often feel depressed. ? You have ever been abused or do not feel safe at home. Summary  Adopting a healthy lifestyle and getting preventive care are important in promoting health and wellness.  Follow your health care provider's instructions about healthy  diet, exercising, and getting tested or screened for diseases.  Follow your health care provider's instructions on monitoring your cholesterol and blood pressure. This information is not intended to replace advice given to you by your health care provider. Make sure you discuss any questions you have with your health care provider. Document Released: 08/09/2010 Document Revised: 01/17/2018 Document Reviewed: 01/17/2018 Elsevier Patient Education  2020 Elsevier Inc.   Breast Self-Awareness Breast self-awareness is knowing how your breasts look and feel. Doing breast self-awareness is important. It allows you to catch a breast problem early while it is still small and can be treated. All women should do breast self-awareness, including women who have had breast implants. Tell your doctor if you notice a change in your breasts. What you need:  A mirror.  A well-lit room. How to do a breast self-exam A breast self-exam is one way to learn what is normal for your breasts and to check for changes. To do a breast self-exam: Look for changes  1.   Take off all the clothes above your waist. 2. Stand in front of a mirror in a room with good lighting. 3. Put your hands on your hips. 4. Push your hands down. 5. Look at your breasts and nipples in the mirror to see if one breast or nipple looks different from the other. Check to see if: ? The shape of one breast is different. ? The size of one breast is different. ? There are wrinkles, dips, and bumps in one breast and not the other. 6. Look at each breast for changes in the skin, such as: ? Redness. ? Scaly areas. 7. Look for changes in your nipples, such as: ? Liquid around the nipples. ? Bleeding. ? Dimpling. ? Redness. ? A change in where the nipples are. Feel for changes  1. Lie on your back on the floor. 2. Feel each breast. To do this, follow these steps: ? Pick a breast to feel. ? Put the arm closest to that breast above your head.  ? Use your other arm to feel the nipple area of your breast. Feel the area with the pads of your three middle fingers by making small circles with your fingers. For the first circle, press lightly. For the second circle, press harder. For the third circle, press even harder. ? Keep making circles with your fingers at the different pressures as you move down your breast. Stop when you feel your ribs. ? Move your fingers a little toward the center of your body. ? Start making circles with your fingers again, this time going up until you reach your collarbone. ? Keep making up-and-down circles until you reach your armpit. Remember to keep using the three pressures. ? Feel the other breast in the same way. 3. Sit or stand in the tub or shower. 4. With soapy water on your skin, feel each breast the same way you did in step 2 when you were lying on the floor. Write down what you find Writing down what you find can help you remember what to tell your doctor. Write down:  What is normal for each breast.  Any changes you find in each breast, including: ? The kind of changes you find. ? Whether you have pain. ? Size and location of any lumps.  When you last had your menstrual period. General tips  Check your breasts every month.  If you are breastfeeding, the best time to check your breasts is after you feed your baby or after you use a breast pump.  If you get menstrual periods, the best time to check your breasts is 5-7 days after your menstrual period is over.  With time, you will become comfortable with the self-exam, and you will begin to know if there are changes in your breasts. Contact a doctor if you:  See a change in the shape or size of your breasts or nipples.  See a change in the skin of your breast or nipples, such as red or scaly skin.  Have fluid coming from your nipples that is not normal.  Find a lump or thick area that was not there before.  Have pain in your breasts.   Have any concerns about your breast health. Summary  Breast self-awareness includes looking for changes in your breasts, as well as feeling for changes within your breasts.  Breast self-awareness should be done in front of a mirror in a well-lit room.  You should check your breasts every month. If you get menstrual   periods, the best time to check your breasts is 5-7 days after your menstrual period is over.  Let your doctor know of any changes you see in your breasts, including changes in size, changes on the skin, pain or tenderness, or fluid from your nipples that is not normal. This information is not intended to replace advice given to you by your health care provider. Make sure you discuss any questions you have with your health care provider. Document Released: 07/13/2007 Document Revised: 09/12/2017 Document Reviewed: 09/12/2017 Elsevier Patient Education  2020 Elsevier Inc.  

## 2018-08-14 NOTE — Telephone Encounter (Signed)
No answer LMTRC for prescreening.  

## 2018-08-15 ENCOUNTER — Other Ambulatory Visit: Payer: Self-pay

## 2018-08-15 ENCOUNTER — Other Ambulatory Visit (HOSPITAL_COMMUNITY)
Admission: RE | Admit: 2018-08-15 | Discharge: 2018-08-15 | Disposition: A | Discharge status: Home / Self Care | Payer: Medicaid Other | Source: Ambulatory Visit | Attending: Obstetrics and Gynecology | Admitting: Obstetrics and Gynecology

## 2018-08-15 ENCOUNTER — Encounter: Payer: Self-pay | Admitting: Obstetrics and Gynecology

## 2018-08-15 ENCOUNTER — Other Ambulatory Visit: Payer: Self-pay | Admitting: Obstetrics and Gynecology

## 2018-08-15 ENCOUNTER — Ambulatory Visit (INDEPENDENT_AMBULATORY_CARE_PROVIDER_SITE_OTHER): Payer: Medicaid Other | Admitting: Obstetrics and Gynecology

## 2018-08-15 VITALS — BP 98/56 | HR 80 | Ht 66.0 in | Wt 250.2 lb

## 2018-08-15 DIAGNOSIS — Z124 Encounter for screening for malignant neoplasm of cervix: Secondary | ICD-10-CM | POA: Insufficient documentation

## 2018-08-15 DIAGNOSIS — Z0000 Encounter for general adult medical examination without abnormal findings: Secondary | ICD-10-CM | POA: Diagnosis not present

## 2018-08-15 DIAGNOSIS — Z6841 Body Mass Index (BMI) 40.0 and over, adult: Secondary | ICD-10-CM

## 2018-08-15 DIAGNOSIS — Z01419 Encounter for gynecological examination (general) (routine) without abnormal findings: Secondary | ICD-10-CM | POA: Diagnosis not present

## 2018-08-15 DIAGNOSIS — Z975 Presence of (intrauterine) contraceptive device: Secondary | ICD-10-CM

## 2018-08-15 DIAGNOSIS — O99345 Other mental disorders complicating the puerperium: Secondary | ICD-10-CM

## 2018-08-15 DIAGNOSIS — F53 Postpartum depression: Secondary | ICD-10-CM

## 2018-08-15 DIAGNOSIS — Z13220 Encounter for screening for lipoid disorders: Secondary | ICD-10-CM

## 2018-08-15 DIAGNOSIS — N921 Excessive and frequent menstruation with irregular cycle: Secondary | ICD-10-CM

## 2018-08-15 MED ORDER — SERTRALINE HCL 100 MG PO TABS: 100.0000 mg | ORAL_TABLET | Freq: Every day | ORAL | 3 refills | Status: DC

## 2018-08-15 MED ORDER — ESCITALOPRAM OXALATE 10 MG PO TABS: 10.0000 mg | ORAL_TABLET | Freq: Two times a day (BID) | ORAL | 6 refills | Status: DC

## 2018-08-15 NOTE — Progress Notes (Signed)
Pt is present today for annual exam. Pt stated since that her cycle has been off and on since she got the nexplanon.
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
## 2018-08-16 LAB — LIPID PANEL
Chol/HDL Ratio: 2.9 ratio (ref 0.0–4.4)
Cholesterol, Total: 107 mg/dL (ref 100–199)
HDL: 37 mg/dL — ABNORMAL LOW (ref >39)
LDL Calculated: 57 mg/dL (ref 0–99)
Triglycerides: 67 mg/dL (ref 0–149)
VLDL Cholesterol Cal: 13 mg/dL (ref 5–40)

## 2018-08-16 LAB — GLUCOSE, RANDOM: Glucose: 84 mg/dL (ref 65–99)

## 2018-08-17 LAB — CYTOLOGY - PAP
Chlamydia: NEGATIVE
Diagnosis: NEGATIVE
Neisseria Gonorrhea: NEGATIVE

## 2018-12-11 ENCOUNTER — Ambulatory Visit (INDEPENDENT_AMBULATORY_CARE_PROVIDER_SITE_OTHER): Payer: Medicaid Other | Admitting: Obstetrics and Gynecology

## 2018-12-11 ENCOUNTER — Other Ambulatory Visit: Payer: Self-pay

## 2018-12-11 ENCOUNTER — Encounter: Payer: Self-pay | Admitting: Obstetrics and Gynecology

## 2018-12-11 VITALS — BP 134/87 | HR 80 | Ht 66.0 in | Wt 244.8 lb

## 2018-12-11 DIAGNOSIS — Z8759 Personal history of other complications of pregnancy, childbirth and the puerperium: Secondary | ICD-10-CM

## 2018-12-11 DIAGNOSIS — Z975 Presence of (intrauterine) contraceptive device: Secondary | ICD-10-CM | POA: Diagnosis not present

## 2018-12-11 DIAGNOSIS — R4586 Emotional lability: Secondary | ICD-10-CM

## 2018-12-11 DIAGNOSIS — Z8659 Personal history of other mental and behavioral disorders: Secondary | ICD-10-CM | POA: Diagnosis not present

## 2018-12-11 LAB — POCT URINE PREGNANCY: Preg Test, Ur: NEGATIVE
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
## 2018-12-11 NOTE — Addendum Note (Signed)
Addended by: Edwyna Shell on: 12/11/2018 11:21 AM   Modules accepted: Orders

## 2019-01-06 ENCOUNTER — Encounter: Payer: Self-pay | Admitting: Emergency Medicine

## 2019-01-06 ENCOUNTER — Ambulatory Visit
Admission: EM | Admit: 2019-01-06 | Discharge: 2019-01-06 | Disposition: A | Discharge status: Home / Self Care | Payer: Medicaid Other | Attending: Emergency Medicine | Admitting: Emergency Medicine

## 2019-01-06 ENCOUNTER — Other Ambulatory Visit: Payer: Self-pay

## 2019-01-06 DIAGNOSIS — J029 Acute pharyngitis, unspecified: Secondary | ICD-10-CM | POA: Diagnosis not present

## 2019-01-06 DIAGNOSIS — R432 Parageusia: Secondary | ICD-10-CM

## 2019-01-06 DIAGNOSIS — J069 Acute upper respiratory infection, unspecified: Secondary | ICD-10-CM | POA: Insufficient documentation

## 2019-01-06 DIAGNOSIS — M791 Myalgia, unspecified site: Secondary | ICD-10-CM | POA: Diagnosis not present

## 2019-01-06 DIAGNOSIS — R059 Cough, unspecified: Secondary | ICD-10-CM

## 2019-01-06 DIAGNOSIS — R05 Cough: Secondary | ICD-10-CM | POA: Diagnosis not present

## 2019-01-06 DIAGNOSIS — Z03818 Encounter for observation for suspected exposure to other biological agents ruled out: Secondary | ICD-10-CM | POA: Diagnosis present

## 2019-01-06 DIAGNOSIS — R0981 Nasal congestion: Secondary | ICD-10-CM

## 2019-01-06 LAB — RAPID STREP SCREEN (MED CTR MEBANE ONLY): Streptococcus, Group A Screen (Direct): NEGATIVE

## 2019-01-06 NOTE — ED Triage Notes (Signed)
Patient c/o cough, runny nose, sore throat and sinus congestion that started yesterday.  Patient denies fevers.
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
## 2019-01-06 NOTE — Discharge Instructions (Signed)
URI/COLD SYMPTOMS: Your exam today is consistent with a viral illness. Antibiotics are not indicated at this time. Use medications as directed, including cough syrup, nasal saline, and decongestants. Your symptoms should improve over the next few days and resolve within 7-10 days. Increase rest and fluids. F/u if symptoms worsen or predominate such as sore throat, ear pain, productive cough, shortness of breath, or if you develop high fevers or worsening fatigue over the next several days.  ° °Your condition could be due to COVID 19 infection. We have obtained testing to determine this and results will come back in 1-3 days. In the mean time, assume this is going to be a positive test and treat/monitor yourself as if you do have COVID.  ° °At this time go home and rest. Push fluids. Take Tylenol as needed for discomfort. Gargle warm salt water. Throat lozenges. Take Mucinex DM or Robitussin for cough. Humidifier in bedroom to ease coughing. Warm showers. Also review the COVID handout for more information.  ° °COVID-19 INFECTION: The incubation period of COVID-19 is approximately 14 days after exposure, with most symptoms developing in roughly 4-5 days. Symptoms may range in severity from mild to critically severe. Roughly 80% of those infected will have mild symptoms. People of any age may become infected with COVID-19 and have the ability to transmit the virus. The most common symptoms include: fever, fatigue, cough, body aches, headaches, sore throat, nasal congestion, shortness of breath, nausea, vomiting, diarrhea, changes in smell and/or taste.    ° °COURSE OF ILLNESS Some patients may begin with mild disease which can progress quickly into critical symptoms. If your symptoms are worsening please call ahead to the Emergency Department and proceed there for further treatment. Recovery time appears to be roughly 1-2 weeks for mild symptoms and 3-6 weeks for severe disease.   ° °GO IMMEDIATELY TO ER FOR FEVER  >100.6, BREATHING PROBLEMS, CHEST PAIN, FATIGUE, LETHARGY, INABILITY TO EAT OR DRINK, ETC  ° °QUARANTINE AND ISOLATION: To help decrease the spread of COVID-19 please remain isolated if you have COVID infection or are highly suspected to have COVID infection. This means -stay home and isolate to one room in the home if you live with others. Do not share a bed or bathroom with others while ill, sanitize and wipe down all countertops and keep common areas clean and disinfected. You may discontinue isolation if you have a mild case and are asymptomatic 10 days after symptom onset as long as you have been fever free >72 hours without having to take Motrin or Tylenol. If your case is more severe, you may have to isolate longer.   ° °If you have been in close contact (within 6 feet) of someone diagnosed with COVID 19, you are advised to quarantine in your home for 14 days as symptoms can develop anywhere from 2-14 days after exposure to the virus. If you develop symptoms, you  must isolate.   ° °During this global pandemic, CDC advises to practice social distancing, try to stay at least 6ft away from others at all times. Wear a face covering. Wash and sanitize your hands regularly and avoid going anywhere that is not necessary.  ° °KEEP IN MIND THAT THE COVID TEST IS NOT 100% ACCURATE AND YOU SHOULD STILL DO EVERYTHING TO PREVENT POTENTIAL SPREAD OF VIRUS TO OTHERS (WEAR MASK, WEAR GLOVES, WASH HANDS AND SANITIZE REGULARLY)  ° °  °

## 2019-01-08 LAB — CULTURE, GROUP A STREP (THRC)

## 2019-01-08 LAB — NOVEL CORONAVIRUS, NAA (HOSP ORDER, SEND-OUT TO REF LAB; TAT 18-24 HRS): SARS-CoV-2, NAA: NOT DETECTED

## 2019-03-29 ENCOUNTER — Other Ambulatory Visit: Payer: Self-pay

## 2019-03-29 ENCOUNTER — Ambulatory Visit
Admission: EM | Admit: 2019-03-29 | Discharge: 2019-03-29 | Disposition: A | Discharge status: Home / Self Care | Payer: Medicaid Other | Attending: Family Medicine | Admitting: Family Medicine

## 2019-03-29 ENCOUNTER — Ambulatory Visit: Payer: Medicaid Other

## 2019-03-29 DIAGNOSIS — W230XXA Caught, crushed, jammed, or pinched between moving objects, initial encounter: Secondary | ICD-10-CM

## 2019-03-29 DIAGNOSIS — S9031XA Contusion of right foot, initial encounter: Secondary | ICD-10-CM

## 2019-03-29 MED ORDER — NAPROXEN 500 MG PO TABS: 500.0000 mg | ORAL_TABLET | Freq: Two times a day (BID) | ORAL | 0 refills | Status: DC
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
## 2019-03-29 NOTE — Discharge Instructions (Signed)
Your xray is normal today, there is no broken bones or fractures on xray.  You obviously injured your foot with what transpired today, bruising likely source of pain.  Ice, elevation and naproxen twice a day, tylenol can be added as well, to help with pain.  May be painful for the next week and then I would expect gradual improvement.
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
## 2019-04-23 ENCOUNTER — Telehealth: Payer: Self-pay | Admitting: Obstetrics and Gynecology

## 2019-04-23 NOTE — Telephone Encounter (Signed)
Called patient to inform her of her appt change. LVM of the appt change and told patient that if the time and date didn't work to give [place] a call back and we could get her rescheduled.

## 2019-04-24 ENCOUNTER — Encounter: Payer: Medicaid Other | Admitting: Obstetrics and Gynecology

## 2019-04-25 ENCOUNTER — Encounter: Payer: Medicaid Other | Admitting: Obstetrics and Gynecology

## 2019-04-30 NOTE — Progress Notes (Deleted)
Pt present to discuss birth control options and concerns. Pt

## 2019-05-01 ENCOUNTER — Encounter: Payer: Medicaid Other | Admitting: Obstetrics and Gynecology

## 2019-05-07 ENCOUNTER — Other Ambulatory Visit: Payer: Self-pay | Admitting: Orthopedic Surgery

## 2019-05-07 DIAGNOSIS — M25512 Pain in left shoulder: Secondary | ICD-10-CM

## 2019-05-15 ENCOUNTER — Encounter: Payer: Medicaid Other | Admitting: Obstetrics and Gynecology

## 2019-05-22 ENCOUNTER — Ambulatory Visit
Admission: RE | Admit: 2019-05-22 | Discharge: 2019-05-22 | Disposition: A | Discharge status: Home / Self Care | Payer: Medicaid Other | Source: Ambulatory Visit | Attending: Orthopedic Surgery | Admitting: Orthopedic Surgery

## 2019-05-22 ENCOUNTER — Other Ambulatory Visit: Payer: Self-pay

## 2019-05-22 DIAGNOSIS — M25512 Pain in left shoulder: Secondary | ICD-10-CM | POA: Insufficient documentation

## 2019-05-22 MED ORDER — LIDOCAINE HCL (PF) 1 % IJ SOLN
10.0000 mL | Freq: Once | INTRAMUSCULAR | Status: AC
  Administered 2019-05-22: 10:00:00 10 mL
  Filled 2019-05-22: qty 10

## 2019-05-22 MED ORDER — SODIUM CHLORIDE (PF) 0.9 % IJ SOLN
20.0000 mL | Freq: Once | INTRAMUSCULAR | Status: AC
  Administered 2019-05-22: 10:00:00 7 mL

## 2019-05-22 MED ORDER — GADOBUTROL 1 MMOL/ML IV SOLN
2.0000 mL | Freq: Once | INTRAVENOUS | Status: AC
  Administered 2019-05-22: 10:00:00 0.1 mL

## 2019-05-22 MED ORDER — IOHEXOL 180 MG/ML  SOLN
20.0000 mL | Freq: Once | INTRAMUSCULAR | Status: AC
  Administered 2019-05-22: 13 mL via INTRA_ARTICULAR

## 2019-06-05 ENCOUNTER — Encounter: Payer: Medicaid Other | Admitting: Obstetrics and Gynecology

## 2019-06-11 ENCOUNTER — Encounter: Payer: Self-pay | Admitting: Obstetrics and Gynecology

## 2019-06-11 ENCOUNTER — Other Ambulatory Visit: Payer: Self-pay

## 2019-06-11 ENCOUNTER — Ambulatory Visit (INDEPENDENT_AMBULATORY_CARE_PROVIDER_SITE_OTHER): Payer: Medicaid Other | Admitting: Obstetrics and Gynecology

## 2019-06-11 VITALS — BP 107/70 | HR 85 | Ht 66.0 in | Wt 238.8 lb

## 2019-06-11 DIAGNOSIS — Z975 Presence of (intrauterine) contraceptive device: Secondary | ICD-10-CM

## 2019-06-11 DIAGNOSIS — Z30430 Encounter for insertion of intrauterine contraceptive device: Secondary | ICD-10-CM

## 2019-06-11 DIAGNOSIS — Z3046 Encounter for surveillance of implantable subdermal contraceptive: Secondary | ICD-10-CM | POA: Diagnosis not present

## 2019-06-11 DIAGNOSIS — N921 Excessive and frequent menstruation with irregular cycle: Secondary | ICD-10-CM

## 2019-06-11 NOTE — Progress Notes (Signed)
    GYNECOLOGY OFFICE PROCEDURE NOTE  Tiffany Andrews is a 24 y.o. G1P1001 here for Nexplanon removal due to complaints of persistent abnormal bleeding.  She has had the Nexplanon in place sincde 11/2017.  She has tried interventions such as NSAIDs, OCPs with only temporary relief.  Desires removal today.  Last pap smear was on 08/15/2018 and was normal.     Nexplanon Removal Patient identified, informed consent performed, consent signed.   Appropriate time out taken. Nexplanon site identified.  Area prepped in usual sterile fashon. Three ml of 1% lidocaine was used to anesthetize the area at the distal end of the implant. A small stab incision was made right beside the implant on the distal portion.  The Nexplanon rod was grasped using hemostats and removed without difficulty.  There was minimal blood loss. There were no complications.  Steri-strips were applied over the small incision.  A pressure bandage was applied to reduce any bruising.  The patient tolerated the procedure well and was given post procedure instructions.  Patient is planning to use a Mirena IUD for contraception (see insertion note below).      IUD Insertion Procedure Note Patient identified, informed consent performed, consent signed.   Discussed risks of irregular bleeding, cramping, infection, malpositioning or misplacement of the IUD outside the uterus which may require further procedure such as laparoscopy. Also discussed >99% contraception efficacy, increased risk of ectopic pregnancy with failure of method.  Time out was performed.  Urine pregnancy test negative.  Speculum placed in the vagina.  Cervix visualized.  Cleaned with Betadine x 2.  Grasped anteriorly with a single tooth tenaculum.  Uterus sounded to 7.5 cm.  Mirena IUD placed per manufacturer's recommendations.  Strings trimmed to 3 cm. Tenaculum was removed, good hemostasis noted.  Patient tolerated procedure well.   Patient was given post-procedure  instructions.  She was advised to have backup contraception for one week.  Patient was also asked to check IUD strings periodically and follow up in 4 weeks for IUD check.    Lot: [id number] Exp: 09/2021

## 2019-06-11 NOTE — Patient Instructions (Signed)
Intrauterine Device Insertion, Care After  This sheet gives you information about how to care for yourself after your procedure. Your health care provider may also give you more specific instructions. If you have problems or questions, contact your health care provider. What can I expect after the procedure? After the procedure, it is common to have:  Cramps and pain in the abdomen.  Light bleeding (spotting) or heavier bleeding that is like your menstrual period. This may last for up to a few days.  Lower back pain.  Dizziness.  Headaches.  Nausea. Follow these instructions at home:  Before resuming sexual activity, check to make sure that you can feel the IUD string(s). You should be able to feel the end of the string(s) below the opening of your cervix. If your IUD string is in place, you may resume sexual activity. ? If you had a hormonal IUD inserted more than 7 days after your most recent period started, you will need to use a backup method of birth control for 7 days after IUD insertion. Ask your health care provider whether this applies to you.  Continue to check that the IUD is still in place by feeling for the string(s) after every menstrual period, or once a month.  Take over-the-counter and prescription medicines only as told by your health care provider.  Do not drive or use heavy machinery while taking prescription pain medicine.  Keep all follow-up visits as told by your health care provider. This is important. Contact a health care provider if:  You have bleeding that is heavier or lasts longer than a normal menstrual cycle.  You have a fever.  You have cramps or abdominal pain that get worse or do not get better with medicine.  You develop abdominal pain that is new or is not in the same area of earlier cramping and pain.  You feel lightheaded or weak.  You have abnormal or bad-smelling discharge from your vagina.  You have pain during sexual  activity.  You have any of the following problems with your IUD string(s): ? The string bothers or hurts you or your sexual partner. ? You cannot feel the string. ? The string has gotten longer.  You can feel the IUD in your vagina.  You think you may be pregnant, or you miss your menstrual period.  You think you may have an STI (sexually transmitted infection). Get help right away if:  You have flu-like symptoms.  You have a fever and chills.  You can feel that your IUD has slipped out of place. Summary  After the procedure, it is common to have cramps and pain in the abdomen. It is also common to have light bleeding (spotting) or heavier bleeding that is like your menstrual period.  Continue to check that the IUD is still in place by feeling for the string(s) after every menstrual period, or once a month.  Keep all follow-up visits as told by your health care provider. This is important.  Contact your health care provider if you have problems with your IUD string(s), such as the string getting longer or bothering you or your sexual partner. This information is not intended to replace advice given to you by your health care provider. Make sure you discuss any questions you have with your health care provider. Document Revised: 01/06/2017 Document Reviewed: 12/16/2015 Elsevier Patient Education  2020 [employer]  

## 2019-06-11 NOTE — Progress Notes (Signed)
Pt present due to prolonged/irregular cycles while on Nexplanon.

## 2019-08-15 ENCOUNTER — Other Ambulatory Visit: Payer: Self-pay

## 2019-08-15 ENCOUNTER — Ambulatory Visit (INDEPENDENT_AMBULATORY_CARE_PROVIDER_SITE_OTHER)
Admission: RE | Admit: 2019-08-15 | Discharge: 2019-08-15 | Disposition: A | Discharge status: Other Acute Inpatient Hospital | Payer: Medicaid Other | Source: Ambulatory Visit

## 2019-08-15 DIAGNOSIS — R509 Fever, unspecified: Secondary | ICD-10-CM

## 2019-08-15 NOTE — Discharge Instructions (Addendum)
Come to the Urgent Care for in-person evaluation.   
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
## 2019-08-20 ENCOUNTER — Encounter: Payer: Medicaid Other | Admitting: Obstetrics and Gynecology

## 2019-08-22 ENCOUNTER — Encounter: Payer: Self-pay | Admitting: Obstetrics and Gynecology

## 2019-09-09 ENCOUNTER — Encounter: Payer: Self-pay | Admitting: Obstetrics and Gynecology

## 2019-09-28 ENCOUNTER — Ambulatory Visit
Admission: EM | Admit: 2019-09-28 | Discharge: 2019-09-28 | Disposition: A | Discharge status: Home / Self Care | Payer: Medicaid Other | Attending: Family Medicine | Admitting: Family Medicine

## 2019-09-28 ENCOUNTER — Encounter: Payer: Self-pay | Admitting: Emergency Medicine

## 2019-09-28 ENCOUNTER — Other Ambulatory Visit: Payer: Self-pay

## 2019-09-28 DIAGNOSIS — J029 Acute pharyngitis, unspecified: Secondary | ICD-10-CM

## 2019-09-28 DIAGNOSIS — R05 Cough: Secondary | ICD-10-CM | POA: Diagnosis present

## 2019-09-28 DIAGNOSIS — Z20822 Contact with and (suspected) exposure to covid-19: Secondary | ICD-10-CM | POA: Diagnosis present

## 2019-09-28 DIAGNOSIS — B349 Viral infection, unspecified: Secondary | ICD-10-CM | POA: Insufficient documentation

## 2019-09-28 DIAGNOSIS — R059 Cough, unspecified: Secondary | ICD-10-CM

## 2019-09-28 LAB — GROUP A STREP BY PCR: Group A Strep by PCR: NOT DETECTED

## 2019-09-28 MED ORDER — BENZONATATE 100 MG PO CAPS: 200.0000 mg | ORAL_CAPSULE | Freq: Three times a day (TID) | ORAL | 0 refills | Status: AC | PRN

## 2019-09-28 MED ORDER — LIDOCAINE VISCOUS HCL 2 % MT SOLN: OROMUCOSAL | 0 refills | Status: AC

## 2019-09-28 MED ORDER — FLUTICASONE PROPIONATE 50 MCG/ACT NA SUSP: 1.0000 | Freq: Two times a day (BID) | NASAL | 0 refills | Status: DC
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
## 2019-09-28 NOTE — Discharge Instructions (Addendum)

## 2019-09-28 NOTE — ED Triage Notes (Signed)
Patient c/o dry cough, congestion and headaches and bodyaches for the past 3 days.  Patient states that she was exposed to a COVID positive person on 09/21/19.  Patient reports fevers.

## 2019-09-29 LAB — SARS CORONAVIRUS 2 (TAT 6-24 HRS): SARS Coronavirus 2: NEGATIVE

## 2020-01-28 ENCOUNTER — Encounter: Payer: Self-pay | Admitting: Emergency Medicine

## 2020-01-28 ENCOUNTER — Other Ambulatory Visit: Payer: Self-pay

## 2020-01-28 ENCOUNTER — Ambulatory Visit
Admission: EM | Admit: 2020-01-28 | Discharge: 2020-01-28 | Disposition: A | Discharge status: Home / Self Care | Payer: Medicaid Other | Attending: Family Medicine | Admitting: Family Medicine

## 2020-01-28 DIAGNOSIS — T22211A Burn of second degree of right forearm, initial encounter: Secondary | ICD-10-CM

## 2020-01-28 MED ORDER — MUPIROCIN 2 % EX OINT: 1.0000 "application " | TOPICAL_OINTMENT | Freq: Two times a day (BID) | CUTANEOUS | 0 refills | Status: AC
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
## 2020-02-22 ENCOUNTER — Ambulatory Visit
Admission: EM | Admit: 2020-02-22 | Discharge: 2020-02-22 | Disposition: A | Discharge status: Home / Self Care | Payer: Medicaid Other | Attending: Family Medicine | Admitting: Family Medicine

## 2020-02-22 ENCOUNTER — Other Ambulatory Visit: Payer: Self-pay

## 2020-02-22 DIAGNOSIS — F17210 Nicotine dependence, cigarettes, uncomplicated: Secondary | ICD-10-CM | POA: Diagnosis not present

## 2020-02-22 DIAGNOSIS — J069 Acute upper respiratory infection, unspecified: Secondary | ICD-10-CM | POA: Insufficient documentation

## 2020-02-22 DIAGNOSIS — Z20822 Contact with and (suspected) exposure to covid-19: Secondary | ICD-10-CM | POA: Insufficient documentation

## 2020-02-22 LAB — RAPID INFLUENZA A&B ANTIGENS
Influenza A (ARMC): NEGATIVE
Influenza B (ARMC): NEGATIVE

## 2020-02-22 MED ORDER — KETOROLAC TROMETHAMINE 60 MG/2ML IM SOLN
60.0000 mg | Freq: Once | INTRAMUSCULAR | Status: AC
  Administered 2020-02-22: 60 mg via INTRAMUSCULAR

## 2020-02-22 MED ORDER — BENZONATATE 100 MG PO CAPS: 100.0000 mg | ORAL_CAPSULE | Freq: Three times a day (TID) | ORAL | 0 refills | Status: DC | PRN
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
## 2020-02-22 NOTE — Discharge Instructions (Signed)
Self isolate until covid results are back.  We will notify you by phone if it is positive. Your negative results will be sent through your MyChart.    If it is positive you need to isolate from others for a total of 5 days. If no fever for 24 hours without medications, and symptoms improving you may end isolation on day 6, but wear a mask if around any others for an additional 5 days.   Over the counter medications such as mucinex d to help with symptoms.  You may use your nebulizer as needed for wheezing or shortness of breath .  Tessalon as needed for cough.  If symptoms worsen or do not improve in the next week to return to be seen or to follow up with your PCP.

## 2020-02-22 NOTE — ED Triage Notes (Signed)
t is here with a sore throat, nasal congestion and body aches that started 2 days ago, pt has taken Advil to relieve discomfort.

## 2020-02-23 LAB — SARS CORONAVIRUS 2 (TAT 6-24 HRS): SARS Coronavirus 2: NEGATIVE

## 2020-02-25 LAB — CULTURE, GROUP A STREP (THRC)

## 2020-05-06 ENCOUNTER — Other Ambulatory Visit: Payer: Self-pay

## 2020-05-06 MED ORDER — ESCITALOPRAM OXALATE 10 MG PO TABS
10.0000 mg | ORAL_TABLET | Freq: Two times a day (BID) | ORAL | 3 refills | Status: DC
Start: 2020-05-06 — End: 2021-07-24

## 2020-07-27 ENCOUNTER — Ambulatory Visit
Admission: RE | Admit: 2020-07-27 | Discharge: 2020-07-27 | Disposition: A | Discharge status: Home / Self Care | Payer: Medicaid Other | Source: Ambulatory Visit | Attending: Emergency Medicine | Admitting: Emergency Medicine

## 2020-07-27 ENCOUNTER — Other Ambulatory Visit: Payer: Self-pay

## 2020-07-27 ENCOUNTER — Ambulatory Visit (INDEPENDENT_AMBULATORY_CARE_PROVIDER_SITE_OTHER): Payer: Medicaid Other

## 2020-07-27 VITALS — BP 116/72 | HR 60 | Temp 98.3°F | Resp 17 | Ht 66.0 in | Wt 205.0 lb

## 2020-07-27 DIAGNOSIS — M79672 Pain in left foot: Secondary | ICD-10-CM

## 2020-07-27 MED ORDER — IBUPROFEN 600 MG PO TABS: 600.0000 mg | ORAL_TABLET | Freq: Four times a day (QID) | ORAL | 0 refills | Status: DC | PRN

## 2020-07-27 MED ORDER — METHYLPREDNISOLONE 4 MG PO TBPK: ORAL_TABLET | Freq: Every day | ORAL | 0 refills | Status: DC

## 2020-07-27 NOTE — ED Triage Notes (Signed)
Patient states that she has been having left foot pain x 2 weeks. States that pain is worse with moving. States that she squatted down yesterday and felt a pain in the top of foot. Denies any known injury. 

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
## 2020-07-27 NOTE — Discharge Instructions (Addendum)
Take 600 mg of ibuprofen combined with 1000 mg of Tylenol 3-4 times a day as needed for pain.  Ice your foot at the end of the day.  Wear good, supportive shoes.  Finish methylprednisolone, unless a provider tells you to stop.  Follow-up with podiatry if you are not better in a week.  Stress fractures do not always show up on x-ray.  You may need advanced imaging.

## 2020-08-25 ENCOUNTER — Ambulatory Visit (INDEPENDENT_AMBULATORY_CARE_PROVIDER_SITE_OTHER): Payer: Medicaid Other | Admitting: Obstetrics and Gynecology

## 2020-08-25 ENCOUNTER — Other Ambulatory Visit: Payer: Self-pay

## 2020-08-25 ENCOUNTER — Encounter: Payer: Self-pay | Admitting: Obstetrics and Gynecology

## 2020-08-25 VITALS — BP 121/76 | HR 87 | Ht 66.0 in | Wt 199.9 lb

## 2020-08-25 DIAGNOSIS — N921 Excessive and frequent menstruation with irregular cycle: Secondary | ICD-10-CM

## 2020-08-25 DIAGNOSIS — L659 Nonscarring hair loss, unspecified: Secondary | ICD-10-CM | POA: Diagnosis not present

## 2020-08-25 DIAGNOSIS — Z975 Presence of (intrauterine) contraceptive device: Secondary | ICD-10-CM

## 2020-08-25 DIAGNOSIS — R102 Pelvic and perineal pain: Secondary | ICD-10-CM | POA: Diagnosis not present

## 2020-08-25 DIAGNOSIS — R634 Abnormal weight loss: Secondary | ICD-10-CM

## 2020-08-25 NOTE — Progress Notes (Signed)
    GYNECOLOGY PROGRESS NOTE  Subjective:    Patient ID: Tiffany Andrews, female    DOB: 1995-06-30, 25 y.o.   MRN: [id number]  HPI  Patient is a 25 y.o. G1P1001 female who presents for pain with intercourse (usually after orgasm, lasts for several minutes, mostly on right side), irregular bleeding and spotting and notice bleeding after lifting any heavy objects. Has an IUD in place.  Symptoms ongoing for the past 4 months. Recently started a new job where she does a lot of lifting (~ 15 lbs or less).   Also noting hair loss and weight loss for the past year.  Hair loss has been coming out in clumps. Has lost over 40 lbs, unintentional.   The following portions of the patient's history were reviewed and updated as appropriate: allergies, current medications, past family history, past medical history, past social history, past surgical history, and problem list.  Review of Systems Pertinent items noted in HPI and remainder of comprehensive ROS otherwise negative.   Objective:   Blood pressure 121/76, pulse 87, height 5\' 6"  (1.676 m), weight 199 lb 14.4 oz (90.7 kg), last menstrual period 08/22/2020. Body mass index is 32.26 kg/m. General appearance: alert and no distress Abdomen: soft, non-tender; bowel sounds normal; no masses,  no organomegaly Pelvic: external genitalia normal, rectovaginal septum normal.  Vagina with small amount of thin white discharge.  Cervix normal appearing, no lesions and no motion tenderness.  IUD threads visible, ~ 3 cm in length. Uterus mobile, nontender, normal shape and size.  Adnexae non-palpable, moderately tender on right, non-tender on left.   Extremities: extremities normal, atraumatic, no cyanosis or edema Neurologic: Grossly normal   Assessment:   1. Breakthrough bleeding with IUD   2. Pelvic pain   3. Hair loss   4. Weight loss      Plan:   Breakthrough bleeding with IUD - Hs had Mirena IUD in place for 1 year.  Unclear cause. Also associated  pelvic pain. Will order pelvic ultrasound to assess proper location/position within the uterus. Also to assess for ovarian cyst as most pain identified on right side.  Pelvic pain, could be caused by IUD, adnexal but will also assess for infection, Nuswab ordered.  Hair loss and weight loss, unclear cause.  Unable to order labs as it is close of the day. Will order at next visit.    08/24/2020, MD [facility]

## 2020-08-25 NOTE — Patient Instructions (Signed)
Pelvic Pain, Female Pelvic pain is pain in your lower belly (abdomen), below your belly button and between your hips. The pain may start suddenly (be acute), keep coming back (be recurring), or last a long time (become chronic). Pelvic pain that lasts longer than 6 months is called chronic pelvic pain. There are many causes of pelvic pain. Sometimes the cause of pelvic pain is not known. Follow these instructions at home:  Take over-the-counter and prescription medicines only as told by your doctor. Rest as told by your doctor. Do not have sex if it hurts. Keep a journal of your pelvic pain. Write down: When the pain started. Where the pain is located. What seems to make the pain better or worse, such as food or your period (menstrual cycle). Any symptoms you have along with the pain. Keep all follow-up visits as told by your doctor. This is important. Contact a doctor if: Medicine does not help your pain. Your pain comes back. You have new symptoms. You have unusual discharge or bleeding from your vagina. You have a fever or chills. You are having trouble pooping (constipation). You have blood in your pee (urine) or poop (stool). Your pee smells bad. You feel weak or light-headed. Get help right away if: You have sudden pain that is very bad. Your pain keeps getting worse. You have very bad pain and also have any of these symptoms: A fever. Feeling sick to your stomach (nausea). Throwing up (vomiting). Being very sweaty. You pass out (lose consciousness). Summary Pelvic pain is pain in your lower belly (abdomen), below your belly button and between your hips. There are many possible causes of pelvic pain. Keep a journal of your pelvic pain. This information is not intended to replace advice given to you by your health care provider. Make sure you discuss any questions you have with your health care provider. Document Revised: 07/12/2017 Document Reviewed: 07/12/2017 Elsevier  Patient Education  2022 Elsevier Inc.  

## 2020-08-28 ENCOUNTER — Other Ambulatory Visit: Payer: Medicaid Other

## 2020-08-28 ENCOUNTER — Other Ambulatory Visit: Payer: Self-pay | Admitting: Obstetrics and Gynecology

## 2020-08-28 ENCOUNTER — Other Ambulatory Visit: Payer: Self-pay

## 2020-08-28 DIAGNOSIS — R634 Abnormal weight loss: Secondary | ICD-10-CM

## 2020-08-28 DIAGNOSIS — L659 Nonscarring hair loss, unspecified: Secondary | ICD-10-CM

## 2020-08-30 LAB — FOLATE: Folate: 2.2 ng/mL — ABNORMAL LOW (ref >3.0)

## 2020-08-30 LAB — NUSWAB VAGINITIS PLUS (VG+)
Atopobium vaginae: HIGH Score — AB
BVAB 2: HIGH Score — AB
Candida albicans, NAA: NEGATIVE
Candida glabrata, NAA: NEGATIVE
Chlamydia trachomatis, NAA: NEGATIVE
Megasphaera 1: HIGH Score — AB
Neisseria gonorrhoeae, NAA: NEGATIVE
Trich vag by NAA: NEGATIVE

## 2020-08-30 LAB — ZINC: Zinc: 97 ug/dL (ref 44–115)

## 2020-08-30 LAB — VITAMIN B12: Vitamin B-12: 456 pg/mL (ref 232–1245)

## 2020-08-30 LAB — TSH: TSH: 2.47 u[IU]/mL (ref 0.450–4.500)

## 2020-08-30 LAB — VITAMIN D 25 HYDROXY (VIT D DEFICIENCY, FRACTURES): Vit D, 25-Hydroxy: 56.8 ng/mL (ref 30.0–100.0)

## 2020-08-30 MED ORDER — METRONIDAZOLE 500 MG PO TABS: 500.0000 mg | ORAL_TABLET | Freq: Two times a day (BID) | ORAL | 0 refills | Status: DC

## 2020-08-30 NOTE — Addendum Note (Signed)
Addended by: Fabian November on: 08/30/2020 03:59 PM   Modules accepted: Orders

## 2020-08-31 NOTE — Telephone Encounter (Signed)
I called patient. She had some concerns about BV. Concerns were addressed. Patient verbalized understanding.

## 2020-09-25 ENCOUNTER — Other Ambulatory Visit: Payer: Self-pay

## 2020-09-25 ENCOUNTER — Emergency Department
Admission: EM | Admit: 2020-09-25 | Discharge: 2020-09-25 | Disposition: A | Discharge status: Home / Self Care | Payer: BLUE CROSS/BLUE SHIELD | Attending: Emergency Medicine | Admitting: Emergency Medicine

## 2020-09-25 ENCOUNTER — Emergency Department: Payer: BLUE CROSS/BLUE SHIELD

## 2020-09-25 ENCOUNTER — Ambulatory Visit: Payer: Medicaid Other | Attending: Obstetrics and Gynecology

## 2020-09-25 DIAGNOSIS — J45909 Unspecified asthma, uncomplicated: Secondary | ICD-10-CM | POA: Diagnosis not present

## 2020-09-25 DIAGNOSIS — F17210 Nicotine dependence, cigarettes, uncomplicated: Secondary | ICD-10-CM | POA: Insufficient documentation

## 2020-09-25 DIAGNOSIS — R110 Nausea: Secondary | ICD-10-CM | POA: Insufficient documentation

## 2020-09-25 DIAGNOSIS — R55 Syncope and collapse: Secondary | ICD-10-CM | POA: Diagnosis not present

## 2020-09-25 LAB — URINALYSIS, COMPLETE (UACMP) WITH MICROSCOPIC
Bacteria, UA: NONE SEEN
Bilirubin Urine: NEGATIVE
Glucose, UA: NEGATIVE mg/dL
Hgb urine dipstick: NEGATIVE
Ketones, ur: NEGATIVE mg/dL
Leukocytes,Ua: NEGATIVE
Nitrite: NEGATIVE
Protein, ur: NEGATIVE mg/dL
Specific Gravity, Urine: 1.003 — ABNORMAL LOW (ref 1.005–1.030)
pH: 8 (ref 5.0–8.0)

## 2020-09-25 LAB — CBC
HCT: 42.1 % (ref 36.0–46.0)
Hemoglobin: 14.2 g/dL (ref 12.0–15.0)
MCH: 29.6 pg (ref 26.0–34.0)
MCHC: 33.7 g/dL (ref 30.0–36.0)
MCV: 87.7 fL (ref 80.0–100.0)
Platelets: 298 10*3/uL (ref 150–400)
RBC: 4.8 MIL/uL (ref 3.87–5.11)
RDW: 13 % (ref 11.5–15.5)
WBC: 9.5 10*3/uL (ref 4.0–10.5)
nRBC: 0 % (ref 0.0–0.2)

## 2020-09-25 LAB — TROPONIN I (HIGH SENSITIVITY): Troponin I (High Sensitivity): 2 ng/L (ref <18)

## 2020-09-25 LAB — BASIC METABOLIC PANEL
Anion gap: 6 (ref 5–15)
BUN: 8 mg/dL (ref 6–20)
CO2: 25 mmol/L (ref 22–32)
Calcium: 8.8 mg/dL — ABNORMAL LOW (ref 8.9–10.3)
Chloride: 105 mmol/L (ref 98–111)
Creatinine, Ser: 0.87 mg/dL (ref 0.44–1.00)
GFR, Estimated: >60 mL/min (ref >60)
Glucose, Bld: 87 mg/dL (ref 70–99)
Potassium: 3.7 mmol/L (ref 3.5–5.1)
Sodium: 136 mmol/L (ref 135–145)

## 2020-09-25 LAB — POC URINE PREG, ED: Preg Test, Ur: NEGATIVE

## 2020-09-25 MED ORDER — SODIUM CHLORIDE 0.9 % IV BOLUS
1000.0000 mL | Freq: Once | INTRAVENOUS | Status: AC
  Administered 2020-09-25: 1000 mL via INTRAVENOUS

## 2020-09-25 MED ORDER — PROCHLORPERAZINE EDISYLATE 10 MG/2ML IJ SOLN
10.0000 mg | Freq: Once | INTRAMUSCULAR | Status: AC
  Administered 2020-09-25: 10 mg via INTRAVENOUS
  Filled 2020-09-25: qty 2
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
## 2020-09-25 NOTE — ED Notes (Signed)
Patient transported to CT 

## 2020-09-25 NOTE — ED Triage Notes (Signed)
Pt comes via EMs with c/o syncopal episode today. Pt was at work when it happened. Pt states she had another episode and went to UC and was told she might be dehydrated.  EMs reports pt did pass out while they were there. VSS

## 2020-09-25 NOTE — Discharge Instructions (Addendum)
Please seek medical attention for any high fevers, chest pain, shortness of breath, change in behavior, persistent vomiting, bloody stool or any other new or concerning symptoms.  

## 2020-09-28 ENCOUNTER — Encounter: Payer: Self-pay | Admitting: Emergency Medicine

## 2020-09-28 ENCOUNTER — Emergency Department
Admission: EM | Admit: 2020-09-28 | Discharge: 2020-09-28 | Disposition: A | Discharge status: Home / Self Care | Payer: BC Managed Care – PPO | Attending: Emergency Medicine | Admitting: Emergency Medicine

## 2020-09-28 ENCOUNTER — Emergency Department: Payer: BC Managed Care – PPO

## 2020-09-28 ENCOUNTER — Other Ambulatory Visit: Payer: Self-pay

## 2020-09-28 DIAGNOSIS — F17210 Nicotine dependence, cigarettes, uncomplicated: Secondary | ICD-10-CM | POA: Diagnosis not present

## 2020-09-28 DIAGNOSIS — N9489 Other specified conditions associated with female genital organs and menstrual cycle: Secondary | ICD-10-CM | POA: Diagnosis not present

## 2020-09-28 DIAGNOSIS — J45909 Unspecified asthma, uncomplicated: Secondary | ICD-10-CM | POA: Insufficient documentation

## 2020-09-28 DIAGNOSIS — Z20822 Contact with and (suspected) exposure to covid-19: Secondary | ICD-10-CM | POA: Insufficient documentation

## 2020-09-28 DIAGNOSIS — R55 Syncope and collapse: Secondary | ICD-10-CM | POA: Insufficient documentation

## 2020-09-28 DIAGNOSIS — R0602 Shortness of breath: Secondary | ICD-10-CM | POA: Insufficient documentation

## 2020-09-28 LAB — BASIC METABOLIC PANEL
Anion gap: 7 (ref 5–15)
BUN: 8 mg/dL (ref 6–20)
CO2: 23 mmol/L (ref 22–32)
Calcium: 8.6 mg/dL — ABNORMAL LOW (ref 8.9–10.3)
Chloride: 107 mmol/L (ref 98–111)
Creatinine, Ser: 0.7 mg/dL (ref 0.44–1.00)
GFR, Estimated: >60 mL/min (ref >60)
Glucose, Bld: 91 mg/dL (ref 70–99)
Potassium: 3.2 mmol/L — ABNORMAL LOW (ref 3.5–5.1)
Sodium: 137 mmol/L (ref 135–145)

## 2020-09-28 LAB — URINALYSIS, COMPLETE (UACMP) WITH MICROSCOPIC
Bilirubin Urine: NEGATIVE
Glucose, UA: NEGATIVE mg/dL
Hgb urine dipstick: NEGATIVE
Ketones, ur: 5 mg/dL — AB
Leukocytes,Ua: NEGATIVE
Nitrite: NEGATIVE
Protein, ur: NEGATIVE mg/dL
Specific Gravity, Urine: 1.003 — ABNORMAL LOW (ref 1.005–1.030)
pH: 8 (ref 5.0–8.0)

## 2020-09-28 LAB — CBC
HCT: 37.7 % (ref 36.0–46.0)
Hemoglobin: 13.3 g/dL (ref 12.0–15.0)
MCH: 31.3 pg (ref 26.0–34.0)
MCHC: 35.3 g/dL (ref 30.0–36.0)
MCV: 88.7 fL (ref 80.0–100.0)
Platelets: 250 10*3/uL (ref 150–400)
RBC: 4.25 MIL/uL (ref 3.87–5.11)
RDW: 12.8 % (ref 11.5–15.5)
WBC: 10.7 10*3/uL — ABNORMAL HIGH (ref 4.0–10.5)
nRBC: 0 % (ref 0.0–0.2)

## 2020-09-28 LAB — RESP PANEL BY RT-PCR (FLU A&B, COVID) ARPGX2
Influenza A by PCR: NEGATIVE
Influenza B by PCR: NEGATIVE
SARS Coronavirus 2 by RT PCR: NEGATIVE

## 2020-09-28 LAB — HCG, QUANTITATIVE, PREGNANCY: hCG, Beta Chain, Quant, S: 1 m[IU]/mL (ref <5)

## 2020-09-28 LAB — TROPONIN I (HIGH SENSITIVITY)
Troponin I (High Sensitivity): 3 ng/L (ref <18)
Troponin I (High Sensitivity): 2 ng/L (ref <18)

## 2020-09-28 LAB — D-DIMER, QUANTITATIVE: D-Dimer, Quant: <0.27 ug/mL-FEU (ref 0.00–0.50)

## 2020-09-28 MED ORDER — SODIUM CHLORIDE 0.9 % IV BOLUS
1000.0000 mL | Freq: Once | INTRAVENOUS | Status: AC
  Administered 2020-09-28: 1000 mL via INTRAVENOUS

## 2020-09-28 MED ORDER — POTASSIUM CHLORIDE CRYS ER 20 MEQ PO TBCR
40.0000 meq | EXTENDED_RELEASE_TABLET | Freq: Once | ORAL | Status: AC
  Administered 2020-09-28: 40 meq via ORAL
  Filled 2020-09-28: qty 2
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
## 2020-09-28 NOTE — ED Notes (Signed)
Rainbow sent to lab

## 2020-09-28 NOTE — ED Triage Notes (Signed)
Pt via EMS from work. EMS states that pt has been having syncopal episodes. Approx 4 episodes while she was with EMS. Pt c/o CP and NVD. EMS states that pt's HR will decrease and O2 will decrease during these episode. Pt was seen recently and d/c for the same and was dx with a heart murmur.   During triage pt had this same syncopal episode that last approx 20 seconds. Pt did not respond to sternal rub. Pt O2 is fluctuating during triage, pt placed on 2L [place].   EMS gave 4 Zofran

## 2020-09-28 NOTE — Discharge Instructions (Signed)
Follow-up with cardiology for further work-up but you return to the ER if you develop recurrent symptoms or any other concern.  Try to stay well-hydrated.

## 2020-10-10 ENCOUNTER — Emergency Department
Admission: EM | Admit: 2020-10-10 | Discharge: 2020-10-10 | Disposition: A | Discharge status: Home / Self Care | Payer: BC Managed Care – PPO | Attending: Emergency Medicine | Admitting: Emergency Medicine

## 2020-10-10 ENCOUNTER — Other Ambulatory Visit: Payer: Self-pay

## 2020-10-10 ENCOUNTER — Emergency Department: Payer: BC Managed Care – PPO

## 2020-10-10 DIAGNOSIS — R0789 Other chest pain: Secondary | ICD-10-CM | POA: Diagnosis not present

## 2020-10-10 DIAGNOSIS — R110 Nausea: Secondary | ICD-10-CM | POA: Diagnosis not present

## 2020-10-10 DIAGNOSIS — J45909 Unspecified asthma, uncomplicated: Secondary | ICD-10-CM | POA: Insufficient documentation

## 2020-10-10 DIAGNOSIS — F17210 Nicotine dependence, cigarettes, uncomplicated: Secondary | ICD-10-CM | POA: Diagnosis not present

## 2020-10-10 DIAGNOSIS — R55 Syncope and collapse: Secondary | ICD-10-CM | POA: Diagnosis present

## 2020-10-10 LAB — URINALYSIS, COMPLETE (UACMP) WITH MICROSCOPIC
Bilirubin Urine: NEGATIVE
Glucose, UA: NEGATIVE mg/dL
Hgb urine dipstick: NEGATIVE
Ketones, ur: NEGATIVE mg/dL
Nitrite: NEGATIVE
Protein, ur: NEGATIVE mg/dL
Specific Gravity, Urine: 1.015 (ref 1.005–1.030)
pH: 8.5 — ABNORMAL HIGH (ref 5.0–8.0)

## 2020-10-10 LAB — BASIC METABOLIC PANEL
Anion gap: 5 (ref 5–15)
BUN: 9 mg/dL (ref 6–20)
CO2: 27 mmol/L (ref 22–32)
Calcium: 8.8 mg/dL — ABNORMAL LOW (ref 8.9–10.3)
Chloride: 107 mmol/L (ref 98–111)
Creatinine, Ser: 0.78 mg/dL (ref 0.44–1.00)
GFR, Estimated: >60 mL/min (ref >60)
Glucose, Bld: 102 mg/dL — ABNORMAL HIGH (ref 70–99)
Potassium: 3.8 mmol/L (ref 3.5–5.1)
Sodium: 139 mmol/L (ref 135–145)

## 2020-10-10 LAB — CBC WITH DIFFERENTIAL/PLATELET
Abs Immature Granulocytes: 0.02 10*3/uL (ref 0.00–0.07)
Basophils Absolute: 0.1 10*3/uL (ref 0.0–0.1)
Basophils Relative: 1 %
Eosinophils Absolute: 0.2 10*3/uL (ref 0.0–0.5)
Eosinophils Relative: 2 %
HCT: 37.9 % (ref 36.0–46.0)
Hemoglobin: 13.1 g/dL (ref 12.0–15.0)
Immature Granulocytes: 0 %
Lymphocytes Relative: 25 %
Lymphs Abs: 2.3 10*3/uL (ref 0.7–4.0)
MCH: 31 pg (ref 26.0–34.0)
MCHC: 34.6 g/dL (ref 30.0–36.0)
MCV: 89.8 fL (ref 80.0–100.0)
Monocytes Absolute: 0.5 10*3/uL (ref 0.1–1.0)
Monocytes Relative: 5 %
Neutro Abs: 6.1 10*3/uL (ref 1.7–7.7)
Neutrophils Relative %: 67 %
Platelets: 230 10*3/uL (ref 150–400)
RBC: 4.22 MIL/uL (ref 3.87–5.11)
RDW: 12.8 % (ref 11.5–15.5)
WBC: 9.1 10*3/uL (ref 4.0–10.5)
nRBC: 0 % (ref 0.0–0.2)

## 2020-10-10 LAB — TROPONIN I (HIGH SENSITIVITY): Troponin I (High Sensitivity): <2 ng/L (ref <18)

## 2020-10-10 LAB — D-DIMER, QUANTITATIVE: D-Dimer, Quant: 0.3 ug/mL-FEU (ref 0.00–0.50)

## 2020-10-10 LAB — POC URINE PREG, ED: Preg Test, Ur: NEGATIVE

## 2020-10-10 NOTE — Discharge Instructions (Addendum)
Your symptoms today seem most consistent with vasovagal syncope.  You should schedule follow-up with your cardiologist to review results of your Holter monitor.  If you start to feel symptoms coming on again, you should lay down on the ground to prevent yourself from passing out.  Please drink plenty of fluids and return to the ER for any new or worsening symptoms.
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
## 2020-10-10 NOTE — ED Triage Notes (Signed)
Pt BIBA from work for a syncopal episode today at 1030. Pt c/o chest tightness and double vision that started at 1035 today. Pt is wearing a holter monitor x 1 week. Hx of same sx. Last episode was Monday.   HR 88 BP 112/70 100% RA CBG 102

## 2020-10-10 NOTE — ED Triage Notes (Signed)
LOC for unknown amount of time. Pt has has 4 occurrences of syncope in the past.

## 2020-11-06 ENCOUNTER — Encounter: Payer: Medicaid Other | Admitting: Obstetrics and Gynecology

## 2020-11-08 ENCOUNTER — Other Ambulatory Visit: Payer: Self-pay

## 2020-11-08 ENCOUNTER — Encounter: Payer: Self-pay | Admitting: Emergency Medicine

## 2020-11-08 ENCOUNTER — Emergency Department
Admission: EM | Admit: 2020-11-08 | Discharge: 2020-11-09 | Disposition: A | Discharge status: Home / Self Care | Payer: BC Managed Care – PPO | Attending: Emergency Medicine | Admitting: Emergency Medicine

## 2020-11-08 DIAGNOSIS — R079 Chest pain, unspecified: Secondary | ICD-10-CM | POA: Diagnosis not present

## 2020-11-08 DIAGNOSIS — R002 Palpitations: Secondary | ICD-10-CM | POA: Insufficient documentation

## 2020-11-08 DIAGNOSIS — R0602 Shortness of breath: Secondary | ICD-10-CM | POA: Insufficient documentation

## 2020-11-08 DIAGNOSIS — Z5321 Procedure and treatment not carried out due to patient leaving prior to being seen by health care provider: Secondary | ICD-10-CM | POA: Diagnosis not present

## 2020-11-08 LAB — CBC
HCT: 41.8 % (ref 36.0–46.0)
Hemoglobin: 14.3 g/dL (ref 12.0–15.0)
MCH: 30.6 pg (ref 26.0–34.0)
MCHC: 34.2 g/dL (ref 30.0–36.0)
MCV: 89.3 fL (ref 80.0–100.0)
Platelets: 287 10*3/uL (ref 150–400)
RBC: 4.68 MIL/uL (ref 3.87–5.11)
RDW: 12.7 % (ref 11.5–15.5)
WBC: 9.7 10*3/uL (ref 4.0–10.5)
nRBC: 0 % (ref 0.0–0.2)

## 2020-11-08 LAB — BASIC METABOLIC PANEL
Anion gap: 4 — ABNORMAL LOW (ref 5–15)
BUN: 6 mg/dL (ref 6–20)
CO2: 26 mmol/L (ref 22–32)
Calcium: 8.7 mg/dL — ABNORMAL LOW (ref 8.9–10.3)
Chloride: 109 mmol/L (ref 98–111)
Creatinine, Ser: 0.98 mg/dL (ref 0.44–1.00)
GFR, Estimated: >60 mL/min (ref >60)
Glucose, Bld: 89 mg/dL (ref 70–99)
Potassium: 4.7 mmol/L (ref 3.5–5.1)
Sodium: 139 mmol/L (ref 135–145)

## 2020-11-08 LAB — TROPONIN I (HIGH SENSITIVITY): Troponin I (High Sensitivity): <2 ng/L (ref <18)

## 2020-11-08 NOTE — ED Triage Notes (Signed)
Pt to ED via [facility] c/o syncopal episode that is causing CP and SOB. Pt said the symptoms primarily started last night when she had a syncopal episode.  Pt also thinks she had a syncopal episode today as well but cannot recall who called EMS.  Pt describes symptoms as dull chest tightness and sharp stabbing pain under right breast.  Pt denies CP at this moment, but does have SOB  Pt states she has hx of syncopal episodes and arrhythmias.   Pt has 18 L AC  A&Ox4, NAD at this time.

## 2020-11-08 NOTE — ED Triage Notes (Signed)
First RN Note: pt to ED via [facility] from a [facility]. Per EMS pt coming a friends house. Per EMS today c/o chest palpitations and SOB and tingling to hands. Per EMS pt was on cardiac monitor in mid jun-sept and is currently awaiting results, EKG showed sinus arrhythmia.    18G L AC 4mg  Zofran 58-78 sinus arrhythmia  130 systolic

## 2021-01-11 ENCOUNTER — Ambulatory Visit
Admission: EM | Admit: 2021-01-11 | Discharge: 2021-01-11 | Disposition: A | Discharge status: Home / Self Care | Payer: BLUE CROSS/BLUE SHIELD | Attending: Urgent Care | Admitting: Urgent Care

## 2021-01-11 ENCOUNTER — Encounter: Payer: Self-pay | Admitting: Emergency Medicine

## 2021-01-11 DIAGNOSIS — L03113 Cellulitis of right upper limb: Secondary | ICD-10-CM | POA: Diagnosis not present

## 2021-01-11 DIAGNOSIS — M79631 Pain in right forearm: Secondary | ICD-10-CM

## 2021-01-11 MED ORDER — DOXYCYCLINE HYCLATE 100 MG PO CAPS: 100.0000 mg | ORAL_CAPSULE | Freq: Two times a day (BID) | ORAL | 0 refills | Status: DC
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
## 2021-01-11 NOTE — Discharge Instructions (Signed)
Apply warm compresses to the infection area.  Do this 3-5 times daily 5 to 10 minutes at a time.  Start doxycycline to address the cellulitis infection. Do not use any nonsteroidal anti-inflammatories (NSAIDs) like ibuprofen, Motrin, naproxen, Aleve, etc. which are all available over-the-counter.  Please just use Tylenol at a dose of 500mg -650mg  once every 6 hours as needed for your aches, pains, fevers.

## 2021-01-11 NOTE — ED Triage Notes (Signed)
Pt here with possible abscess below right elbow x 3 days. States it has appeared before a few years ago but goes away. Painful, with erythema and hot to touch.

## 2021-02-07 HISTORY — PX: COLON RESECTION: SHX5231

## 2021-03-17 ENCOUNTER — Other Ambulatory Visit: Payer: Self-pay

## 2021-03-17 ENCOUNTER — Ambulatory Visit
Admission: EM | Admit: 2021-03-17 | Discharge: 2021-03-17 | Disposition: A | Discharge status: Home / Self Care | Payer: BC Managed Care – PPO | Attending: Emergency Medicine | Admitting: Emergency Medicine

## 2021-03-17 DIAGNOSIS — Z20822 Contact with and (suspected) exposure to covid-19: Secondary | ICD-10-CM | POA: Diagnosis not present

## 2021-03-17 DIAGNOSIS — R059 Cough, unspecified: Secondary | ICD-10-CM | POA: Diagnosis present

## 2021-03-17 DIAGNOSIS — F17290 Nicotine dependence, other tobacco product, uncomplicated: Secondary | ICD-10-CM | POA: Diagnosis not present

## 2021-03-17 DIAGNOSIS — H9201 Otalgia, right ear: Secondary | ICD-10-CM | POA: Diagnosis not present

## 2021-03-17 DIAGNOSIS — J069 Acute upper respiratory infection, unspecified: Secondary | ICD-10-CM | POA: Diagnosis not present

## 2021-03-17 DIAGNOSIS — J45909 Unspecified asthma, uncomplicated: Secondary | ICD-10-CM | POA: Diagnosis not present

## 2021-03-17 DIAGNOSIS — R0981 Nasal congestion: Secondary | ICD-10-CM | POA: Insufficient documentation

## 2021-03-17 HISTORY — DX: Supraventricular tachycardia: I47.1

## 2021-03-17 HISTORY — DX: Supraventricular tachycardia, unspecified: I47.10

## 2021-03-17 LAB — RESP PANEL BY RT-PCR (FLU A&B, COVID) ARPGX2
Influenza A by PCR: NEGATIVE
Influenza B by PCR: NEGATIVE
SARS Coronavirus 2 by RT PCR: NEGATIVE

## 2021-03-17 MED ORDER — GUAIFENESIN ER 600 MG PO TB12: 600.0000 mg | ORAL_TABLET | Freq: Two times a day (BID) | ORAL | 0 refills | Status: DC

## 2021-03-17 MED ORDER — CETIRIZINE HCL 10 MG PO TABS: 10.0000 mg | ORAL_TABLET | Freq: Every day | ORAL | 0 refills | Status: DC

## 2021-03-17 MED ORDER — FLUTICASONE PROPIONATE 50 MCG/ACT NA SUSP: 1.0000 | Freq: Every day | NASAL | 0 refills | Status: DC
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
## 2021-03-17 NOTE — ED Triage Notes (Signed)
Patient presents to Urgent Care with complaints of cough, intermittent fever, and nasal congestion x 3 days ago. Treating with alka-seltzer and tylenol cold/flu. Last dose tylenol at about 0530.

## 2021-03-17 NOTE — Discharge Instructions (Signed)
Your symptoms today are most likely being caused by a virus and should steadily improve in time it can take up to 7 to 10 days before you truly start to see a turnaround however things will get better  COVID and flu negative    You can take Tylenol and/or Ibuprofen as needed for fever reduction and pain relief.   For cough: honey 1/2 to 1 teaspoon (you can dilute the honey in water or another fluid).  You can also use guaifenesin and dextromethorphan for cough. You can use a humidifier for chest congestion and cough.  If you don't have a humidifier, you can sit in the bathroom with the hot shower running.      For sore throat: try warm salt water gargles, cepacol lozenges, throat spray, warm tea or water with lemon/honey, popsicles or ice, or OTC cold relief medicine for throat discomfort.   For congestion: take a daily anti-histamine like Zyrtec, Claritin, and a oral decongestant, such as pseudoephedrine.  You can also use Flonase 1-2 sprays in each nostril daily.   It is important to stay hydrated: drink plenty of fluids (water, gatorade/powerade/pedialyte, juices, or teas) to keep your throat moisturized and help further relieve irritation/discomfort.

## 2021-03-18 ENCOUNTER — Ambulatory Visit: Payer: BC Managed Care – PPO

## 2021-03-30 ENCOUNTER — Other Ambulatory Visit: Payer: Self-pay

## 2021-03-30 ENCOUNTER — Emergency Department
Admission: EM | Admit: 2021-03-30 | Discharge: 2021-03-30 | Disposition: A | Discharge status: Home / Self Care | Payer: BC Managed Care – PPO | Attending: Emergency Medicine | Admitting: Emergency Medicine

## 2021-03-30 DIAGNOSIS — R42 Dizziness and giddiness: Secondary | ICD-10-CM

## 2021-03-30 DIAGNOSIS — R55 Syncope and collapse: Secondary | ICD-10-CM | POA: Insufficient documentation

## 2021-03-30 LAB — URINALYSIS, ROUTINE W REFLEX MICROSCOPIC
Bacteria, UA: NONE SEEN
Bilirubin Urine: NEGATIVE
Glucose, UA: NEGATIVE mg/dL
Hgb urine dipstick: NEGATIVE
Ketones, ur: NEGATIVE mg/dL
Nitrite: NEGATIVE
Protein, ur: NEGATIVE mg/dL
Specific Gravity, Urine: 1.015 (ref 1.005–1.030)
pH: 6 (ref 5.0–8.0)

## 2021-03-30 LAB — CBC
HCT: 40.8 % (ref 36.0–46.0)
Hemoglobin: 13.4 g/dL (ref 12.0–15.0)
MCH: 29.6 pg (ref 26.0–34.0)
MCHC: 32.8 g/dL (ref 30.0–36.0)
MCV: 90.1 fL (ref 80.0–100.0)
Platelets: 302 10*3/uL (ref 150–400)
RBC: 4.53 MIL/uL (ref 3.87–5.11)
RDW: 12.4 % (ref 11.5–15.5)
WBC: 10.3 10*3/uL (ref 4.0–10.5)
nRBC: 0 % (ref 0.0–0.2)

## 2021-03-30 LAB — BASIC METABOLIC PANEL
Anion gap: 7 (ref 5–15)
BUN: 7 mg/dL (ref 6–20)
CO2: 25 mmol/L (ref 22–32)
Calcium: 8.8 mg/dL — ABNORMAL LOW (ref 8.9–10.3)
Chloride: 105 mmol/L (ref 98–111)
Creatinine, Ser: 0.81 mg/dL (ref 0.44–1.00)
GFR, Estimated: >60 mL/min (ref >60)
Glucose, Bld: 92 mg/dL (ref 70–99)
Potassium: 4 mmol/L (ref 3.5–5.1)
Sodium: 137 mmol/L (ref 135–145)

## 2021-03-30 LAB — POC URINE PREG, ED: Preg Test, Ur: NEGATIVE

## 2021-03-30 NOTE — ED Triage Notes (Signed)
Pt comes with c/o dizziness. Pt states she was at work and felt a little lightheaded. Pt states she has hx of SVT and takes meds. Pt states she took all meds this am. Pt states she checked pulse and it was 30s.

## 2021-03-30 NOTE — ED Notes (Signed)
First Nurse Note:  Pt to ED via [facility] from work for dizziness. Pt recently dx with SVT. Pt showing sinus on 12 lead with EMS with occasional PVCs. Pt is in NAD.

## 2021-03-30 NOTE — Discharge Instructions (Signed)
Please continue your sotalol and follow-up with Dr. Welton Flakes in the clinic.  If you develop any severely worsening chest pain, passing out or slow heart rate that will not improve then please return to the ED

## 2021-03-30 NOTE — ED Provider Notes (Signed)
[facility] Provider Note    Event Date/Time   First MD Initiated Contact with Patient 03/30/21 1357     (approximate)   History   Dizziness   HPI  Tiffany Andrews is a 26 y.o. female who presents to the ED for evaluation of Dizziness   I reviewed recurrent ED visits for syncope in the past few months. Patient self-reports a history of SVT, for which she takes sotalol.   Patient presents to the ED for evaluation of an episode of dizziness that occurred earlier today while she was at work.  She reports that while she was seated she developed presyncopal dizziness.  A finger pulse oximeter told her that her heart rate was in the 30s.  She reports that she drank a bunch of caffeine, but her dizziness did not improve until about an hour later, so she presents to the ED for evaluation of possible low heart rates.  Reports feeling better now   Physical Exam   Triage Vital Signs: ED Triage Vitals  Enc Vitals Group     BP 03/30/21 1130 102/64     Pulse Rate 03/30/21 1130 62     Resp 03/30/21 1130 18     Temp 03/30/21 1130 98 F (36.7 C)     Temp src --      SpO2 03/30/21 1130 97 %     Weight --      Height --      Head Circumference --      Peak Flow --      Pain Score 03/30/21 1129 0     Pain Loc --      Pain Edu? --      Excl. in GC? --     Most recent vital signs: Vitals:   03/30/21 1130  BP: 102/64  Pulse: 62  Resp: 18  Temp: 98 F (36.7 C)  SpO2: 97%    General: Awake, no distress.  CV:  Good peripheral perfusion. RRR Resp:  Normal effort.  Abd:  No distention.  MSK:  No deformity noted.  Neuro:  No focal deficits appreciated. Cranial nerves II through XII intact 5/5 strength and sensation in all 4 extremities Other:     ED Results / Procedures / Treatments   Labs (all labs ordered are listed, but only abnormal results are displayed) Labs Reviewed  BASIC METABOLIC PANEL - Abnormal; Notable for the following components:       Result Value   Calcium 8.8 (*)    All other components within normal limits  URINALYSIS, ROUTINE W REFLEX MICROSCOPIC - Abnormal; Notable for the following components:   Color, Urine YELLOW (*)    APPearance HAZY (*)    Leukocytes,Ua LARGE (*)    All other components within normal limits  CBC  POC URINE PREG, ED  CBG MONITORING, ED    EKG Sinus rhythm, rate of 63 bpm.  Normal axis and intervals.  No evidence of acute ischemia.  RADIOLOGY   Official radiology report(s): No results found.  PROCEDURES and INTERVENTIONS:  .1-3 Lead EKG Interpretation Performed by: Delton Prairie, MD Authorized by: Delton Prairie, MD     Interpretation: normal     ECG rate:  66   ECG rate assessment: normal     Rhythm: sinus rhythm     Ectopy: none     Conduction: normal    Medications - No data to display   IMPRESSION / MDM / ASSESSMENT AND PLAN /  ED COURSE  I reviewed the triage vital signs and the nursing notes.  26 year old female presents to the ED after an episode of dizziness, with concerns for bradycardia, without evidence of acute pathology and suitable for outpatient management with cardiology follow-up.  Normal vitals on room air.  No cardia over nearly 4 hours of observation in the ED.  EKG is nonischemic with a normal sinus rhythm.  Work-up is benign with normal electrolytes, normal CBC, not pregnant and no infectious urine.  No recurrence of symptoms here in the ED and no barriers to outpatient management.  Advise she continue her sotalol as prescribed and follow-up with her cardiologist.  We discussed return precautions.      FINAL CLINICAL IMPRESSION(S) / ED DIAGNOSES   Final diagnoses:  Dizziness     Rx / DC Orders   ED Discharge Orders     None        Note:  This document was prepared using Dragon voice recognition software and may include unintentional dictation errors.   Delton Prairie, MD 03/30/21 [phone]

## 2021-03-31 ENCOUNTER — Ambulatory Visit: Payer: Self-pay

## 2021-03-31 NOTE — Telephone Encounter (Signed)
°  Chief Complaint: chest pain Symptoms: chest pain that radiates into L arm, SOB, dizziness Frequency: since 2pm Pertinent Negatives: NA Disposition: [x] ED /[] Urgent Care (no appt availability in office) / [] Appointment(In office/virtual)/ []  [facility]/ [] Home Care/ [] Refused Recommended Disposition /[] [facility]/ []  Follow-up with PCP Additional Notes: Pt went to [facility] ED yesterday for same symptoms and was told she was fine. Advised her to go back to ED based on symptoms and could go to different ED if preferred.    Reason for Disposition  Dizziness or lightheadedness  Answer Assessment - Initial Assessment Questions 1. LOCATION: "Where does it hurt?"       L chest L breast  2. RADIATION: "Does the pain go anywhere else?" (e.g., into neck, jaw, arms, back)     L armpit and arm 3. ONSET: "When did the chest pain begin?" (Minutes, hours or days)      2 pm today 4. PATTERN "Does the pain come and go, or has it been constant since it started?"  "Does it get worse with exertion?"      constant 6. SEVERITY: "How bad is the pain?"  (e.g., Scale 1-10; mild, moderate, or severe)    - MILD (1-3): doesn't interfere with normal activities     - MODERATE (4-7): interferes with normal activities or awakens from sleep    - SEVERE (8-10): excruciating pain, unable to do any normal activities       5/6 10. OTHER SYMPTOMS: "Do you have any other symptoms?" (e.g., dizziness, nausea, vomiting, sweating, fever, difficulty breathing, cough)       Dizziness, nausea, SOB 11. PREGNANCY: "Is there any chance you are pregnant?" "When was your last menstrual period?"       No  Protocols used: Chest Pain-A-AH

## 2021-04-01 DIAGNOSIS — I471 Supraventricular tachycardia, unspecified: Secondary | ICD-10-CM | POA: Insufficient documentation

## 2021-04-01 DIAGNOSIS — F419 Anxiety disorder, unspecified: Secondary | ICD-10-CM | POA: Insufficient documentation

## 2021-04-01 DIAGNOSIS — R001 Bradycardia, unspecified: Secondary | ICD-10-CM | POA: Insufficient documentation

## 2021-06-02 DIAGNOSIS — M545 Low back pain, unspecified: Secondary | ICD-10-CM | POA: Insufficient documentation

## 2021-06-15 ENCOUNTER — Emergency Department
Admission: EM | Admit: 2021-06-15 | Discharge: 2021-06-15 | Discharge status: Against Medical Advice | Payer: Medicaid Other | Attending: Emergency Medicine | Admitting: Emergency Medicine

## 2021-06-15 DIAGNOSIS — I471 Supraventricular tachycardia: Secondary | ICD-10-CM | POA: Insufficient documentation

## 2021-06-15 DIAGNOSIS — R55 Syncope and collapse: Secondary | ICD-10-CM | POA: Insufficient documentation

## 2021-06-15 DIAGNOSIS — Z5321 Procedure and treatment not carried out due to patient leaving prior to being seen by health care provider: Secondary | ICD-10-CM | POA: Diagnosis not present

## 2021-06-15 NOTE — ED Notes (Signed)
IV removed prior to patient leaving ed. E-signature not working at this time. Pt verbalized understanding of risks of leaving before being seen by doctor. Pt in NAD and ambulatory at time of D/C. ? ?

## 2021-06-15 NOTE — ED Notes (Signed)
First nurse note-pt brought in via ems from work.  Pt had episode of svt and had near syncopal episode.  Pt vagal herself out of it per ems.  Iv in place.  Zofran 4mg  give.  Bp127/74,p-90,fsbs 135 per ems  hx svt   ?

## 2021-07-07 DIAGNOSIS — R55 Syncope and collapse: Secondary | ICD-10-CM | POA: Insufficient documentation

## 2021-07-16 ENCOUNTER — Emergency Department: Payer: Medicaid Other

## 2021-07-16 ENCOUNTER — Other Ambulatory Visit: Payer: Self-pay

## 2021-07-16 ENCOUNTER — Emergency Department
Admission: EM | Admit: 2021-07-16 | Discharge: 2021-07-16 | Disposition: A | Discharge status: Home / Self Care | Payer: Medicaid Other | Source: Home / Self Care | Attending: Emergency Medicine | Admitting: Emergency Medicine

## 2021-07-16 DIAGNOSIS — K5730 Diverticulosis of large intestine without perforation or abscess without bleeding: Secondary | ICD-10-CM | POA: Insufficient documentation

## 2021-07-16 DIAGNOSIS — K5790 Diverticulosis of intestine, part unspecified, without perforation or abscess without bleeding: Secondary | ICD-10-CM

## 2021-07-16 DIAGNOSIS — N83201 Unspecified ovarian cyst, right side: Secondary | ICD-10-CM | POA: Insufficient documentation

## 2021-07-16 DIAGNOSIS — R109 Unspecified abdominal pain: Secondary | ICD-10-CM

## 2021-07-16 DIAGNOSIS — N83209 Unspecified ovarian cyst, unspecified side: Secondary | ICD-10-CM

## 2021-07-16 LAB — URINALYSIS, ROUTINE W REFLEX MICROSCOPIC
Bilirubin Urine: NEGATIVE
Glucose, UA: NEGATIVE mg/dL
Hgb urine dipstick: NEGATIVE
Ketones, ur: 20 mg/dL — AB
Leukocytes,Ua: NEGATIVE
Nitrite: NEGATIVE
Protein, ur: NEGATIVE mg/dL
Specific Gravity, Urine: 1.03 (ref 1.005–1.030)
pH: 5 (ref 5.0–8.0)

## 2021-07-16 LAB — CBC
HCT: 46.1 % — ABNORMAL HIGH (ref 36.0–46.0)
Hemoglobin: 15.3 g/dL — ABNORMAL HIGH (ref 12.0–15.0)
MCH: 29.8 pg (ref 26.0–34.0)
MCHC: 33.2 g/dL (ref 30.0–36.0)
MCV: 89.7 fL (ref 80.0–100.0)
Platelets: 256 10*3/uL (ref 150–400)
RBC: 5.14 MIL/uL — ABNORMAL HIGH (ref 3.87–5.11)
RDW: 12.4 % (ref 11.5–15.5)
WBC: 15.5 10*3/uL — ABNORMAL HIGH (ref 4.0–10.5)
nRBC: 0 % (ref 0.0–0.2)

## 2021-07-16 LAB — COMPREHENSIVE METABOLIC PANEL
ALT: 18 U/L (ref 0–44)
AST: 21 U/L (ref 15–41)
Albumin: 4.3 g/dL (ref 3.5–5.0)
Alkaline Phosphatase: 42 U/L (ref 38–126)
Anion gap: 7 (ref 5–15)
BUN: 17 mg/dL (ref 6–20)
CO2: 24 mmol/L (ref 22–32)
Calcium: 9 mg/dL (ref 8.9–10.3)
Chloride: 109 mmol/L (ref 98–111)
Creatinine, Ser: 0.88 mg/dL (ref 0.44–1.00)
GFR, Estimated: >60 mL/min (ref >60)
Glucose, Bld: 130 mg/dL — ABNORMAL HIGH (ref 70–99)
Potassium: 4 mmol/L (ref 3.5–5.1)
Sodium: 140 mmol/L (ref 135–145)
Total Bilirubin: 1.8 mg/dL — ABNORMAL HIGH (ref 0.3–1.2)
Total Protein: 7.8 g/dL (ref 6.5–8.1)

## 2021-07-16 LAB — LIPASE, BLOOD: Lipase: 25 U/L (ref 11–51)

## 2021-07-16 LAB — POC URINE PREG, ED: Preg Test, Ur: NEGATIVE

## 2021-07-16 MED ORDER — SODIUM CHLORIDE 0.9 % IV BOLUS
1000.0000 mL | Freq: Once | INTRAVENOUS | Status: AC
  Administered 2021-07-16: 1000 mL via INTRAVENOUS

## 2021-07-16 MED ORDER — MORPHINE SULFATE (PF) 4 MG/ML IV SOLN
4.0000 mg | Freq: Once | INTRAVENOUS | Status: AC
  Administered 2021-07-16: 4 mg via INTRAVENOUS
  Filled 2021-07-16: qty 1

## 2021-07-16 MED ORDER — HYDROCODONE-ACETAMINOPHEN 5-325 MG PO TABS: 1.0000 | ORAL_TABLET | Freq: Four times a day (QID) | ORAL | 0 refills | Status: DC | PRN

## 2021-07-16 MED ORDER — KETOROLAC TROMETHAMINE 30 MG/ML IJ SOLN
15.0000 mg | Freq: Once | INTRAMUSCULAR | Status: AC
  Administered 2021-07-16: 15 mg via INTRAVENOUS
  Filled 2021-07-16: qty 1

## 2021-07-16 MED ORDER — ONDANSETRON 4 MG PO TBDP: 4.0000 mg | ORAL_TABLET | Freq: Three times a day (TID) | ORAL | 0 refills | Status: DC | PRN

## 2021-07-16 MED ORDER — ONDANSETRON HCL 4 MG/2ML IJ SOLN
4.0000 mg | Freq: Once | INTRAMUSCULAR | Status: AC
  Administered 2021-07-16: 4 mg via INTRAVENOUS
  Filled 2021-07-16: qty 2

## 2021-07-16 MED ORDER — IBUPROFEN 800 MG PO TABS: 800.0000 mg | ORAL_TABLET | Freq: Three times a day (TID) | ORAL | 0 refills | Status: DC | PRN

## 2021-07-16 NOTE — ED Provider Notes (Signed)
[facility] Provider Note    Event Date/Time   First MD Initiated Contact with Patient 07/16/21 1538     (approximate)   History   Abdominal Pain   HPI  Tiffany Andrews is a 26 y.o. female   here with acute, severe, right flank pain.  The patient states that she developed progressively worsening right lower abdominal pain over the last 24 hours, which is now severe, 10 out of 10, and comes in intermittent waves.  She said associated nausea and felt like she needs to vomit, which she feels is mostly due to the severity of the pain.  She has a history of ovarian cysts, is status post cholecystectomy as well as appendectomy.  She also has a history of endometriosis.  She has an IUD in place.  Denies any dysuria or urinary symptoms.  No vaginal discharge.  She does have a scant amount of bleeding, and passed clots today, which is somewhat abnormal for her.      Physical Exam   Triage Vital Signs: ED Triage Vitals  Enc Vitals Group     BP 07/16/21 1439 124/79     Pulse Rate 07/16/21 1439 (!) 115     Resp 07/16/21 1439 20     Temp 07/16/21 1439 98.6 F (37 C)     Temp Source 07/16/21 1439 Oral     SpO2 07/16/21 1439 100 %     Weight 07/16/21 1436 195 lb 15.8 oz (88.9 kg)     Height 07/16/21 1436 5\' 6"  (1.676 m)     Head Circumference --      Peak Flow --      Pain Score 07/16/21 1436 9     Pain Loc --      Pain Edu? --      Excl. in GC? --     Most recent vital signs: Vitals:   07/16/21 1439 07/16/21 1805  BP: 124/79 96/87  Pulse: (!) 115 91  Resp: 20 20  Temp: 98.6 F (37 C)   SpO2: 100% 100%     General: Awake, no distress.  CV:  Good peripheral perfusion.  Resp:  Normal effort.  Abd:  No distention.  Moderate right sided lower abdominal tenderness, particularly in the adnexal area.  Tenderness extending to the right flank.  No guarding or rebound. Other:  Moist mucous membranes.   ED Results / Procedures / Treatments   Labs (all  labs ordered are listed, but only abnormal results are displayed) Labs Reviewed  COMPREHENSIVE METABOLIC PANEL - Abnormal; Notable for the following components:      Result Value   Glucose, Bld 130 (*)    Total Bilirubin 1.8 (*)    All other components within normal limits  CBC - Abnormal; Notable for the following components:   WBC 15.5 (*)    RBC 5.14 (*)    Hemoglobin 15.3 (*)    HCT 46.1 (*)    All other components within normal limits  URINALYSIS, ROUTINE W REFLEX MICROSCOPIC - Abnormal; Notable for the following components:   Color, Urine YELLOW (*)    APPearance CLOUDY (*)    Ketones, ur 20 (*)    All other components within normal limits  LIPASE, BLOOD  POC URINE PREG, ED     EKG    RADIOLOGY CT stone: Scattered diverticulosis, no other acute abnormality Pelvic ultrasound: Normal   I also independently reviewed and agree with radiologist interpretations.   PROCEDURES:  Critical Care performed: No   MEDICATIONS ORDERED IN ED: Medications  sodium chloride 0.9 % bolus 1,000 mL (0 mLs Intravenous Stopped 07/16/21 1844)  ondansetron (ZOFRAN) injection 4 mg (4 mg Intravenous Given 07/16/21 1605)  ketorolac (TORADOL) 30 MG/ML injection 15 mg (15 mg Intravenous Given 07/16/21 1605)  morphine (PF) 4 MG/ML injection 4 mg (4 mg Intravenous Given 07/16/21 1605)  morphine (PF) 4 MG/ML injection 4 mg (4 mg Intravenous Given 07/16/21 1758)  sodium chloride 0.9 % bolus 1,000 mL (0 mLs Intravenous Stopped 07/16/21 1942)     IMPRESSION / MDM / ASSESSMENT AND PLAN / ED COURSE  I reviewed the triage vital signs and the nursing notes.                               The patient is on the cardiac monitor to evaluate for evidence of arrhythmia and/or significant heart rate changes.   Ddx:  Differential includes the following, with pertinent life- or limb-threatening emergencies considered:  Ovarian cyst, torsion, nephrolithiasis, pyelo, UTI, abd wall pain, thoracolumbar  radiculopathy  Patient's presentation is most consistent with acute presentation with potential threat to life or bodily function.  MDM:  26 yo F here with acute, severe, R flank pain, nausea. Initial concern for possible torsion or hemorrhagic ovarian cyst based on degree of pain. US obtained, reviewed, and is overall reassuring. CBC shows mild leukocytosis but is o/w reassuring. CMP with normal renal function, LFTs, slight bili elevation noted.UA with ketonuria but no signs of UTI. Lipase wnl. Given degree of her pain, CT obtained and shows no stone, no other acute intra-abd pathology. She is s/p chole and appendectomy.  Suspect possible ruptured cyst vs radicular pain. Will treat with analgesia, return precautions. Discussed her CT findings of diverticulosis with need for GI f/u given her age. Otherwise, she is HDS, pain improved, tolerating PO, with no apparent emergent pathology.   MEDICATIONS GIVEN IN ED: Medications  sodium chloride 0.9 % bolus 1,000 mL (0 mLs Intravenous Stopped 07/16/21 1844)  ondansetron (ZOFRAN) injection 4 mg (4 mg Intravenous Given 07/16/21 1605)  ketorolac (TORADOL) 30 MG/ML injection 15 mg (15 mg Intravenous Given 07/16/21 1605)  morphine (PF) 4 MG/ML injection 4 mg (4 mg Intravenous Given 07/16/21 1605)  morphine (PF) 4 MG/ML injection 4 mg (4 mg Intravenous Given 07/16/21 1758)  sodium chloride 0.9 % bolus 1,000 mL (0 mLs Intravenous Stopped 07/16/21 1942)     Consults:  None   EMR reviewed  Reviewed prior notes including Cardiology visit with [facility], h/o SVT     FINAL CLINICAL IMPRESSION(S) / ED DIAGNOSES   Final diagnoses:  Flank pain  Ruptured ovarian cyst  Diverticulosis     Rx / DC Orders   ED Discharge Orders          Ordered    ibuprofen (ADVIL) 800 MG tablet  Every 8 hours PRN        07/16/21 1926    ondansetron (ZOFRAN-ODT) 4 MG disintegrating tablet  Every 8 hours PRN        07/16/21 1926    HYDROcodone-acetaminophen (NORCO/VICODIN) 5-325  MG tablet  Every 6 hours PRN        07/16/21 1926             Note:  This document was prepared using Dragon voice recognition software and may include unintentional dictation errors.   Shaune Pollack, MD 07/16/21 2211

## 2021-07-16 NOTE — ED Notes (Signed)
DC ppw provided to patient. Pt questions, followup and rx information reviewed. Pt decline vs at time of dc and sent to lobby alert and oriented at this time 

## 2021-07-16 NOTE — ED Triage Notes (Signed)
Pt to ED via POV from home. Pt reports RLQ pain that started last night associated with vomiting.   Pt does not have appendix and gallbladder. Pt also has issues with ovarian cyst.

## 2021-07-17 ENCOUNTER — Emergency Department: Payer: Medicaid Other | Admitting: Anesthesiology

## 2021-07-17 ENCOUNTER — Other Ambulatory Visit: Payer: Self-pay

## 2021-07-17 ENCOUNTER — Inpatient Hospital Stay
Admission: EM | Admit: 2021-07-17 | Discharge: 2021-07-21 | DRG: 330 | Disposition: A | Discharge status: Home / Self Care | Payer: Medicaid Other | Attending: Surgery | Admitting: Surgery

## 2021-07-17 ENCOUNTER — Emergency Department: Payer: Medicaid Other

## 2021-07-17 ENCOUNTER — Encounter
Admission: EM | Disposition: A | Discharge status: Home / Self Care | Payer: Self-pay | Source: Home / Self Care | Attending: Surgery

## 2021-07-17 DIAGNOSIS — Z91013 Allergy to seafood: Secondary | ICD-10-CM | POA: Diagnosis not present

## 2021-07-17 DIAGNOSIS — R1084 Generalized abdominal pain: Secondary | ICD-10-CM

## 2021-07-17 DIAGNOSIS — Z79899 Other long term (current) drug therapy: Secondary | ICD-10-CM

## 2021-07-17 DIAGNOSIS — N809 Endometriosis, unspecified: Secondary | ICD-10-CM | POA: Diagnosis present

## 2021-07-17 DIAGNOSIS — F419 Anxiety disorder, unspecified: Secondary | ICD-10-CM | POA: Diagnosis present

## 2021-07-17 DIAGNOSIS — E872 Acidosis, unspecified: Secondary | ICD-10-CM | POA: Diagnosis present

## 2021-07-17 DIAGNOSIS — Z62810 Personal history of physical and sexual abuse in childhood: Secondary | ICD-10-CM | POA: Diagnosis present

## 2021-07-17 DIAGNOSIS — Z975 Presence of (intrauterine) contraceptive device: Secondary | ICD-10-CM

## 2021-07-17 DIAGNOSIS — Z91030 Bee allergy status: Secondary | ICD-10-CM

## 2021-07-17 DIAGNOSIS — K5790 Diverticulosis of intestine, part unspecified, without perforation or abscess without bleeding: Secondary | ICD-10-CM | POA: Diagnosis present

## 2021-07-17 DIAGNOSIS — Z87892 Personal history of anaphylaxis: Secondary | ICD-10-CM | POA: Diagnosis not present

## 2021-07-17 DIAGNOSIS — F17290 Nicotine dependence, other tobacco product, uncomplicated: Secondary | ICD-10-CM | POA: Diagnosis present

## 2021-07-17 DIAGNOSIS — K562 Volvulus: Secondary | ICD-10-CM | POA: Diagnosis present

## 2021-07-17 DIAGNOSIS — R079 Chest pain, unspecified: Secondary | ICD-10-CM

## 2021-07-17 DIAGNOSIS — Z818 Family history of other mental and behavioral disorders: Secondary | ICD-10-CM

## 2021-07-17 HISTORY — PX: LAPAROSCOPIC RIGHT COLECTOMY: SHX5925

## 2021-07-17 HISTORY — PX: LAPAROTOMY: SHX154

## 2021-07-17 LAB — CBC
HCT: 39.7 % (ref 36.0–46.0)
HCT: 39.2 % (ref 36.0–46.0)
Hemoglobin: 13.4 g/dL (ref 12.0–15.0)
Hemoglobin: 13.1 g/dL (ref 12.0–15.0)
MCH: 30.2 pg (ref 26.0–34.0)
MCH: 29.6 pg (ref 26.0–34.0)
MCHC: 33.8 g/dL (ref 30.0–36.0)
MCHC: 33.4 g/dL (ref 30.0–36.0)
MCV: 89.4 fL (ref 80.0–100.0)
MCV: 88.7 fL (ref 80.0–100.0)
Platelets: 209 10*3/uL (ref 150–400)
Platelets: 179 10*3/uL (ref 150–400)
RBC: 4.44 MIL/uL (ref 3.87–5.11)
RBC: 4.42 MIL/uL (ref 3.87–5.11)
RDW: 12.5 % (ref 11.5–15.5)
RDW: 12.4 % (ref 11.5–15.5)
WBC: 9.5 10*3/uL (ref 4.0–10.5)
WBC: 9.9 10*3/uL (ref 4.0–10.5)
nRBC: 0 % (ref 0.0–0.2)
nRBC: 0 % (ref 0.0–0.2)

## 2021-07-17 LAB — COMPREHENSIVE METABOLIC PANEL
ALT: 17 U/L (ref 0–44)
AST: 20 U/L (ref 15–41)
Albumin: 3.6 g/dL (ref 3.5–5.0)
Alkaline Phosphatase: 35 U/L — ABNORMAL LOW (ref 38–126)
Anion gap: 5 (ref 5–15)
BUN: 11 mg/dL (ref 6–20)
CO2: 20 mmol/L — ABNORMAL LOW (ref 22–32)
Calcium: 7.9 mg/dL — ABNORMAL LOW (ref 8.9–10.3)
Chloride: 109 mmol/L (ref 98–111)
Creatinine, Ser: 0.81 mg/dL (ref 0.44–1.00)
GFR, Estimated: >60 mL/min (ref >60)
Glucose, Bld: 108 mg/dL — ABNORMAL HIGH (ref 70–99)
Potassium: 3.2 mmol/L — ABNORMAL LOW (ref 3.5–5.1)
Sodium: 134 mmol/L — ABNORMAL LOW (ref 135–145)
Total Bilirubin: 2.1 mg/dL — ABNORMAL HIGH (ref 0.3–1.2)
Total Protein: 6.6 g/dL (ref 6.5–8.1)

## 2021-07-17 LAB — TROPONIN I (HIGH SENSITIVITY)
Troponin I (High Sensitivity): 3 ng/L (ref <18)
Troponin I (High Sensitivity): 3 ng/L (ref <18)

## 2021-07-17 LAB — D-DIMER, QUANTITATIVE: D-Dimer, Quant: 0.81 ug/mL-FEU — ABNORMAL HIGH (ref 0.00–0.50)

## 2021-07-17 LAB — HIV ANTIBODY (ROUTINE TESTING W REFLEX): HIV Screen 4th Generation wRfx: NONREACTIVE

## 2021-07-17 LAB — CREATININE, SERUM
Creatinine, Ser: 0.68 mg/dL (ref 0.44–1.00)
GFR, Estimated: >60 mL/min (ref >60)

## 2021-07-17 LAB — HCG, QUANTITATIVE, PREGNANCY: hCG, Beta Chain, Quant, S: <1 m[IU]/mL (ref <5)

## 2021-07-17 LAB — LIPASE, BLOOD: Lipase: 27 U/L (ref 11–51)

## 2021-07-17 SURGERY — LAPAROTOMY, EXPLORATORY
Anesthesia: General | Site: Abdomen

## 2021-07-17 MED ORDER — DIPHENHYDRAMINE HCL 12.5 MG/5ML PO ELIX: 12.5000 mg | ORAL_SOLUTION | Freq: Four times a day (QID) | ORAL | Status: DC | PRN

## 2021-07-17 MED ORDER — ONDANSETRON HCL 4 MG/2ML IJ SOLN
INTRAMUSCULAR | Status: AC
  Filled 2021-07-17: qty 2

## 2021-07-17 MED ORDER — OXYCODONE HCL 5 MG PO TABS
5.0000 mg | ORAL_TABLET | ORAL | Status: DC | PRN
  Administered 2021-07-17: 5 mg via ORAL
  Administered 2021-07-17: 10 mg via ORAL
  Administered 2021-07-17: 5 mg via ORAL
  Administered 2021-07-18 – 2021-07-20 (×7): 10 mg via ORAL
  Administered 2021-07-20: 5 mg via ORAL
  Administered 2021-07-20 – 2021-07-21 (×2): 10 mg via ORAL
  Filled 2021-07-17 (×2): qty 1
  Filled 2021-07-17 (×4): qty 2
  Filled 2021-07-17: qty 1
  Filled 2021-07-17 (×6): qty 2

## 2021-07-17 MED ORDER — SODIUM CHLORIDE 0.9 % IV SOLN: INTRAVENOUS | Status: DC

## 2021-07-17 MED ORDER — POTASSIUM CHLORIDE CRYS ER 20 MEQ PO TBCR
40.0000 meq | EXTENDED_RELEASE_TABLET | Freq: Once | ORAL | Status: AC
  Administered 2021-07-17: 40 meq via ORAL
  Filled 2021-07-17: qty 2

## 2021-07-17 MED ORDER — SUCCINYLCHOLINE CHLORIDE 200 MG/10ML IV SOSY
PREFILLED_SYRINGE | INTRAVENOUS | Status: AC
  Filled 2021-07-17: qty 20

## 2021-07-17 MED ORDER — KETAMINE HCL 50 MG/5ML IJ SOSY
PREFILLED_SYRINGE | INTRAMUSCULAR | Status: AC
  Filled 2021-07-17: qty 5

## 2021-07-17 MED ORDER — SCOPOLAMINE 1 MG/3DAYS TD PT72
MEDICATED_PATCH | TRANSDERMAL | Status: AC
  Filled 2021-07-17: qty 1

## 2021-07-17 MED ORDER — ALUM & MAG HYDROXIDE-SIMETH 200-200-20 MG/5ML PO SUSP
30.0000 mL | Freq: Four times a day (QID) | ORAL | Status: DC | PRN
  Administered 2021-07-17 – 2021-07-20 (×6): 30 mL via ORAL
  Filled 2021-07-17 (×6): qty 30

## 2021-07-17 MED ORDER — KETOROLAC TROMETHAMINE 30 MG/ML IJ SOLN
INTRAMUSCULAR | Status: DC | PRN
  Administered 2021-07-17: 30 mg via INTRAVENOUS

## 2021-07-17 MED ORDER — SODIUM CHLORIDE 0.9 % IV BOLUS (SEPSIS)
1000.0000 mL | Freq: Once | INTRAVENOUS | Status: AC
  Administered 2021-07-17: 1000 mL via INTRAVENOUS

## 2021-07-17 MED ORDER — DEXAMETHASONE SODIUM PHOSPHATE 10 MG/ML IJ SOLN
INTRAMUSCULAR | Status: AC
  Filled 2021-07-17: qty 1

## 2021-07-17 MED ORDER — PROCHLORPERAZINE EDISYLATE 10 MG/2ML IJ SOLN: 5.0000 mg | Freq: Four times a day (QID) | INTRAMUSCULAR | Status: DC | PRN

## 2021-07-17 MED ORDER — MORPHINE SULFATE (PF) 4 MG/ML IV SOLN
4.0000 mg | Freq: Once | INTRAVENOUS | Status: DC
  Filled 2021-07-17 (×2): qty 1

## 2021-07-17 MED ORDER — SUCCINYLCHOLINE CHLORIDE 200 MG/10ML IV SOSY
PREFILLED_SYRINGE | INTRAVENOUS | Status: DC | PRN
  Administered 2021-07-17: 120 mg via INTRAVENOUS

## 2021-07-17 MED ORDER — KETOROLAC TROMETHAMINE 30 MG/ML IJ SOLN
30.0000 mg | Freq: Four times a day (QID) | INTRAMUSCULAR | Status: DC
  Administered 2021-07-18 – 2021-07-21 (×11): 30 mg via INTRAVENOUS
  Filled 2021-07-17 (×12): qty 1

## 2021-07-17 MED ORDER — MIDAZOLAM HCL 2 MG/2ML IJ SOLN
INTRAMUSCULAR | Status: DC | PRN
  Administered 2021-07-17: 2 mg via INTRAVENOUS

## 2021-07-17 MED ORDER — MORPHINE SULFATE (PF) 2 MG/ML IV SOLN
2.0000 mg | INTRAVENOUS | Status: DC | PRN
  Administered 2021-07-17 – 2021-07-20 (×12): 2 mg via INTRAVENOUS
  Filled 2021-07-17 (×12): qty 1

## 2021-07-17 MED ORDER — FENTANYL CITRATE (PF) 100 MCG/2ML IJ SOLN
INTRAMUSCULAR | Status: DC | PRN
Start: 2021-07-17 — End: 2021-07-17
  Administered 2021-07-17 (×2): 50 ug via INTRAVENOUS
  Administered 2021-07-17: 150 ug via INTRAVENOUS

## 2021-07-17 MED ORDER — DEXAMETHASONE SODIUM PHOSPHATE 10 MG/ML IJ SOLN
INTRAMUSCULAR | Status: DC | PRN
  Administered 2021-07-17: 10 mg via INTRAVENOUS

## 2021-07-17 MED ORDER — PROPOFOL 10 MG/ML IV BOLUS
INTRAVENOUS | Status: AC
  Filled 2021-07-17: qty 20

## 2021-07-17 MED ORDER — ACETAMINOPHEN 10 MG/ML IV SOLN: 1000.0000 mg | Freq: Once | INTRAVENOUS | Status: DC | PRN

## 2021-07-17 MED ORDER — SODIUM CHLORIDE 0.9 % IV SOLN
INTRAVENOUS | Status: DC | PRN
  Administered 2021-07-17: 70 mL

## 2021-07-17 MED ORDER — SCOPOLAMINE 1 MG/3DAYS TD PT72
1.0000 | MEDICATED_PATCH | Freq: Once | TRANSDERMAL | Status: AC
  Administered 2021-07-17: 1 via TRANSDERMAL
  Filled 2021-07-17: qty 1

## 2021-07-17 MED ORDER — MELATONIN 5 MG PO TABS: 5.0000 mg | ORAL_TABLET | Freq: Every evening | ORAL | Status: DC | PRN

## 2021-07-17 MED ORDER — EPHEDRINE 5 MG/ML INJ
INTRAVENOUS | Status: AC
  Filled 2021-07-17: qty 5

## 2021-07-17 MED ORDER — PROCHLORPERAZINE MALEATE 10 MG PO TABS: 10.0000 mg | ORAL_TABLET | Freq: Four times a day (QID) | ORAL | Status: DC | PRN

## 2021-07-17 MED ORDER — ONDANSETRON HCL 4 MG/2ML IJ SOLN
4.0000 mg | Freq: Four times a day (QID) | INTRAMUSCULAR | Status: DC | PRN
  Administered 2021-07-19: 4 mg via INTRAVENOUS
  Filled 2021-07-17: qty 2

## 2021-07-17 MED ORDER — EPHEDRINE SULFATE (PRESSORS) 50 MG/ML IJ SOLN
INTRAMUSCULAR | Status: DC | PRN
  Administered 2021-07-17: 10 mg via INTRAVENOUS

## 2021-07-17 MED ORDER — HYDROMORPHONE HCL 1 MG/ML IJ SOLN
0.2500 mg | INTRAMUSCULAR | Status: DC | PRN
  Administered 2021-07-17: 0.25 mg via INTRAVENOUS

## 2021-07-17 MED ORDER — MIDAZOLAM HCL 2 MG/2ML IJ SOLN
INTRAMUSCULAR | Status: AC
  Filled 2021-07-17: qty 2

## 2021-07-17 MED ORDER — ONDANSETRON 4 MG PO TBDP
4.0000 mg | ORAL_TABLET | Freq: Four times a day (QID) | ORAL | Status: DC | PRN
  Administered 2021-07-18 – 2021-07-21 (×3): 4 mg via ORAL
  Filled 2021-07-17 (×3): qty 1

## 2021-07-17 MED ORDER — DROPERIDOL 2.5 MG/ML IJ SOLN: 0.6250 mg | Freq: Once | INTRAMUSCULAR | Status: DC | PRN

## 2021-07-17 MED ORDER — PROMETHAZINE HCL 25 MG/ML IJ SOLN: 6.2500 mg | INTRAMUSCULAR | Status: DC | PRN

## 2021-07-17 MED ORDER — DIPHENHYDRAMINE HCL 50 MG/ML IJ SOLN: 12.5000 mg | Freq: Four times a day (QID) | INTRAMUSCULAR | Status: DC | PRN

## 2021-07-17 MED ORDER — PROPOFOL 10 MG/ML IV BOLUS
INTRAVENOUS | Status: DC | PRN
  Administered 2021-07-17: 130 mg via INTRAVENOUS

## 2021-07-17 MED ORDER — IOHEXOL 350 MG/ML SOLN
100.0000 mL | Freq: Once | INTRAVENOUS | Status: AC | PRN
  Administered 2021-07-17: 100 mL via INTRAVENOUS

## 2021-07-17 MED ORDER — LACTATED RINGERS IV SOLN: INTRAVENOUS | Status: DC | PRN

## 2021-07-17 MED ORDER — ACETAMINOPHEN 500 MG PO TABS
1000.0000 mg | ORAL_TABLET | Freq: Four times a day (QID) | ORAL | Status: DC
  Administered 2021-07-17 – 2021-07-21 (×14): 1000 mg via ORAL
  Filled 2021-07-17 (×15): qty 2

## 2021-07-17 MED ORDER — BUPIVACAINE-EPINEPHRINE (PF) 0.25% -1:200000 IJ SOLN
INTRAMUSCULAR | Status: DC | PRN
  Administered 2021-07-17: 30 mL via PERINEURAL

## 2021-07-17 MED ORDER — KETOROLAC TROMETHAMINE 30 MG/ML IJ SOLN
30.0000 mg | Freq: Once | INTRAMUSCULAR | Status: AC
  Administered 2021-07-17: 30 mg via INTRAVENOUS
  Filled 2021-07-17: qty 1

## 2021-07-17 MED ORDER — ACETAMINOPHEN 10 MG/ML IV SOLN
INTRAVENOUS | Status: AC
  Filled 2021-07-17: qty 100

## 2021-07-17 MED ORDER — SUGAMMADEX SODIUM 200 MG/2ML IV SOLN
INTRAVENOUS | Status: DC | PRN
  Administered 2021-07-17: 200 mg via INTRAVENOUS

## 2021-07-17 MED ORDER — 0.9 % SODIUM CHLORIDE (POUR BTL) OPTIME
TOPICAL | Status: DC | PRN
  Administered 2021-07-17: 1000 mL
  Administered 2021-07-17: 500 mL

## 2021-07-17 MED ORDER — KETAMINE HCL 10 MG/ML IJ SOLN
INTRAMUSCULAR | Status: DC | PRN
  Administered 2021-07-17: 20 mg via INTRAVENOUS
  Administered 2021-07-17: 30 mg via INTRAVENOUS

## 2021-07-17 MED ORDER — ROCURONIUM BROMIDE 100 MG/10ML IV SOLN
INTRAVENOUS | Status: DC | PRN
  Administered 2021-07-17: 50 mg via INTRAVENOUS
  Administered 2021-07-17: 20 mg via INTRAVENOUS

## 2021-07-17 MED ORDER — SODIUM CHLORIDE 0.9 % IV SOLN
2.0000 g | Freq: Two times a day (BID) | INTRAVENOUS | Status: AC
  Administered 2021-07-17 (×2): 2 g via INTRAVENOUS
  Filled 2021-07-17 (×2): qty 2

## 2021-07-17 MED ORDER — PIPERACILLIN-TAZOBACTAM 3.375 G IVPB 30 MIN
3.3750 g | Freq: Once | INTRAVENOUS | Status: AC
  Administered 2021-07-17: 3.375 g via INTRAVENOUS
  Filled 2021-07-17: qty 50

## 2021-07-17 MED ORDER — ENOXAPARIN SODIUM 40 MG/0.4ML IJ SOSY
40.0000 mg | PREFILLED_SYRINGE | INTRAMUSCULAR | Status: DC
  Administered 2021-07-18 – 2021-07-21 (×4): 40 mg via SUBCUTANEOUS
  Filled 2021-07-17 (×4): qty 0.4

## 2021-07-17 MED ORDER — DEXMEDETOMIDINE (PRECEDEX) IN NS 20 MCG/5ML (4 MCG/ML) IV SYRINGE
PREFILLED_SYRINGE | INTRAVENOUS | Status: DC | PRN
  Administered 2021-07-17 (×3): 8 ug via INTRAVENOUS

## 2021-07-17 MED ORDER — LIDOCAINE HCL (CARDIAC) PF 100 MG/5ML IV SOSY
PREFILLED_SYRINGE | INTRAVENOUS | Status: DC | PRN
  Administered 2021-07-17: 80 mg via INTRAVENOUS

## 2021-07-17 MED ORDER — DROPERIDOL 2.5 MG/ML IJ SOLN
2.5000 mg | Freq: Once | INTRAMUSCULAR | Status: AC
  Administered 2021-07-17: 2.5 mg via INTRAVENOUS
  Filled 2021-07-17: qty 2

## 2021-07-17 MED ORDER — HYDROMORPHONE HCL 1 MG/ML IJ SOLN
INTRAMUSCULAR | Status: AC
  Administered 2021-07-17: 0.5 mg via INTRAVENOUS
  Filled 2021-07-17: qty 1

## 2021-07-17 MED ORDER — ACETAMINOPHEN 10 MG/ML IV SOLN
INTRAVENOUS | Status: DC | PRN
  Administered 2021-07-17: 1000 mg via INTRAVENOUS

## 2021-07-17 MED ORDER — PANTOPRAZOLE SODIUM 40 MG IV SOLR
40.0000 mg | Freq: Every day | INTRAVENOUS | Status: DC
  Administered 2021-07-17 – 2021-07-20 (×4): 40 mg via INTRAVENOUS
  Filled 2021-07-17 (×4): qty 10

## 2021-07-17 MED ORDER — ONDANSETRON HCL 4 MG/2ML IJ SOLN
INTRAMUSCULAR | Status: DC | PRN
  Administered 2021-07-17: 4 mg via INTRAVENOUS

## 2021-07-17 MED ORDER — FENTANYL CITRATE (PF) 250 MCG/5ML IJ SOLN
INTRAMUSCULAR | Status: AC
  Filled 2021-07-17: qty 5

## 2021-07-17 SURGICAL SUPPLY — 48 items
APPLIER CLIP 11 MED OPEN (CLIP)
APPLIER CLIP 13 LRG OPEN (CLIP)
BARRIER ADH SEPRAFILM 3INX5IN (MISCELLANEOUS) ×2 IMPLANT
BLADE CLIPPER SURG (BLADE) ×3 IMPLANT
BLADE SURG SZ10 CARB STEEL (BLADE) ×3 IMPLANT
CHLORAPREP W/TINT 26 (MISCELLANEOUS) ×3 IMPLANT
CLIP APPLIE 11 MED OPEN (CLIP) IMPLANT
CLIP APPLIE 13 LRG OPEN (CLIP) IMPLANT
COVER BACK TABLE REUSABLE LG (DRAPES) ×3 IMPLANT
DRAPE LAPAROTOMY 100X77 ABD (DRAPES) ×3 IMPLANT
DRSG OPSITE POSTOP 4X10 (GAUZE/BANDAGES/DRESSINGS) IMPLANT
DRSG OPSITE POSTOP 4X12 (GAUZE/BANDAGES/DRESSINGS) ×1 IMPLANT
ELECT BLADE 6.5 EXT (BLADE) ×3 IMPLANT
ELECT REM PT RETURN 9FT ADLT (ELECTROSURGICAL) ×3
ELECTRODE REM PT RTRN 9FT ADLT (ELECTROSURGICAL) ×2 IMPLANT
GAUZE 4X4 16PLY ~~LOC~~+RFID DBL (SPONGE) ×3 IMPLANT
GLOVE BIO SURGEON STRL SZ7 (GLOVE) ×4 IMPLANT
GOWN STRL REUS W/ TWL LRG LVL3 (GOWN DISPOSABLE) ×4 IMPLANT
GOWN STRL REUS W/TWL LRG LVL3 (GOWN DISPOSABLE) ×12
HANDLE SUCTION POOLE (INSTRUMENTS) ×2 IMPLANT
HANDLE YANKAUER SUCT BULB TIP (MISCELLANEOUS) ×3 IMPLANT
LIGASURE IMPACT 36 18CM CVD LR (INSTRUMENTS) ×1 IMPLANT
MANIFOLD NEPTUNE II (INSTRUMENTS) ×3 IMPLANT
NEEDLE HYPO 22GX1.5 SAFETY (NEEDLE) ×6 IMPLANT
NS IRRIG 500ML POUR BTL (IV SOLUTION) ×1 IMPLANT
PACK BASIN MAJOR ARMC (MISCELLANEOUS) ×3 IMPLANT
PACK COLON CLEAN CLOSURE (MISCELLANEOUS) ×1 IMPLANT
RELOAD PROXIMATE 75MM BLUE (ENDOMECHANICALS) ×12 IMPLANT
RELOAD STAPLE 75 3.8 BLU REG (ENDOMECHANICALS) IMPLANT
SPONGE T-LAP 18X18 ~~LOC~~+RFID (SPONGE) ×9 IMPLANT
SPONGE T-LAP 18X36 ~~LOC~~+RFID STR (SPONGE) IMPLANT
STAPLER PROXIMATE 75MM BLUE (STAPLE) ×1 IMPLANT
STAPLER SKIN PROX 35W (STAPLE) ×3 IMPLANT
SUCTION POOLE HANDLE (INSTRUMENTS) ×3
SUT PDS AB 0 CT1 27 (SUTURE) ×11 IMPLANT
SUT SILK 2 0 (SUTURE) ×3
SUT SILK 2 0 SH CR/8 (SUTURE) ×3 IMPLANT
SUT SILK 2 0SH CR/8 30 (SUTURE) ×3 IMPLANT
SUT SILK 2-0 18XBRD TIE 12 (SUTURE) ×2 IMPLANT
SUT VIC AB 0 CT1 36 (SUTURE) ×6 IMPLANT
SUT VIC AB 2-0 SH 27 (SUTURE) ×9
SUT VIC AB 2-0 SH 27XBRD (SUTURE) ×4 IMPLANT
SUT VIC AB 3-0 SH 27 (SUTURE) ×3
SUT VIC AB 3-0 SH 27X BRD (SUTURE) ×2 IMPLANT
SYR 30ML LL (SYRINGE) ×6 IMPLANT
SYR 3ML LL SCALE MARK (SYRINGE) ×3 IMPLANT
TRAY FOLEY MTR SLVR 16FR STAT (SET/KITS/TRAYS/PACK) ×3 IMPLANT
WATER STERILE IRR 500ML POUR (IV SOLUTION) ×3 IMPLANT

## 2021-07-17 NOTE — Transfer of Care (Signed)
Immediate Anesthesia Transfer of Care Note  Patient: Tiffany Andrews  Procedure(s) Performed: EXPLORATORY LAPAROTOMY LAPAROSCOPIC RIGHT COLECTOMY (Abdomen)  Patient Location: PACU  Anesthesia Type:General  Level of Consciousness: awake, alert  and oriented  Airway & Oxygen Therapy: Patient Spontanous Breathing and Patient connected to nasal cannula oxygen  Post-op Assessment: Report given to RN, Post -op Vital signs reviewed and stable and Patient moving all extremities  Post vital signs: Reviewed and stable  Last Vitals:  Vitals Value Taken Time  BP 110/58 07/17/21 1326  Temp    Pulse 58 07/17/21 1326  Resp 15 07/17/21 1326  SpO2 98 % 07/17/21 1326  Vitals shown include unvalidated device data.  Last Pain:  Vitals:   07/17/21 0417  TempSrc: Oral  PainSc: 10-Worst pain ever         Complications: No notable events documented.

## 2021-07-17 NOTE — Anesthesia Preprocedure Evaluation (Addendum)
Anesthesia Evaluation  Patient identified by MRN, date of birth, ID band Patient awake    Reviewed: Allergy & Precautions, NPO status , Patient's Chart, lab work & pertinent test results, reviewed documented beta blocker date and time   History of Anesthesia Complications (+) history of anesthetic complications (VERY AGGRESSIVE AFTER APPENDECTOMY)  Airway Mallampati: II  TM Distance: >3 FB     Dental no notable dental hx.    Pulmonary asthma , Current Smoker,    Pulmonary exam normal        Cardiovascular Normal cardiovascular exam+ dysrhythmias Supra Ventricular Tachycardia      Neuro/Psych  Headaches, Anxiety    GI/Hepatic GERD  Controlled,  Endo/Other    Renal/GU      Musculoskeletal   Abdominal (+) + obese,  Abdomen: soft and tender.    Peds  Hematology   Anesthesia Other Findings Bowel obstruction with abdominal pain. Denies bloating or vomiting. Abdomen soft Obese. Alcohol use: Yes,  Hypokalemia  CT: IMPRESSION: 1. No evidence for acute pulmonary embolus. 2. Interval mobilization of the cecum into the upper abdomen. On the previous exam the cecum was in the right hemipelvis. The cecum is now left of the midline and is progressively more distended when compared to previous exam. The cecum measures up to 10 cm with transition to 4 cm ascending colon in the right lower quadrant of the abdomen. 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 3. No evidence for bowel perforation or abscess formation.  Reproductive/Obstetrics                            Anesthesia Physical  Anesthesia Plan  ASA: 2 and emergent  Anesthesia Plan: General   Post-op Pain Management: Toradol IV (intra-op)* and Ofirmev IV (intra-op)*   Induction: Intravenous and Rapid sequence  PONV Risk Score and Plan: 4 or greater and Ondansetron, Dexamethasone, Treatment may  vary due to age or medical condition, Scopolamine patch - Pre-op, Midazolam and Promethazine  Airway Management Planned: Oral ETT  Additional Equipment:   Intra-op Plan:   Post-operative Plan: Extubation in OR  Informed Consent: I have reviewed the patients History and Physical, chart, labs and discussed the procedure including the risks, benefits and alternatives for the proposed anesthesia with the patient or authorized representative who has indicated his/her understanding and acceptance.       Plan Discussed with: CRNA  Anesthesia Plan Comments:        Anesthesia Quick Evaluation

## 2021-07-17 NOTE — ED Notes (Signed)
Patient taken to OR at this time.

## 2021-07-17 NOTE — Anesthesia Procedure Notes (Signed)
Procedure Name: Intubation Date/Time: 07/17/2021 11:21 AM  Performed by: Esaw Grandchild, CRNAPre-anesthesia Checklist: Patient identified, Emergency Drugs available, Suction available and Patient being monitored Patient Re-evaluated:Patient Re-evaluated prior to induction Oxygen Delivery Method: Circle system utilized Preoxygenation: Pre-oxygenation with 100% oxygen Induction Type: IV induction, Rapid sequence and Cricoid Pressure applied Ventilation: Mask ventilation without difficulty Laryngoscope Size: Miller and 2 Grade View: Grade I Tube type: Oral Tube size: 7.5 mm Number of attempts: 1 Airway Equipment and Method: Stylet, Oral airway and Bite block Placement Confirmation: ETT inserted through vocal cords under direct vision, positive ETCO2 and breath sounds checked- equal and bilateral Secured at: 21 cm Tube secured with: Tape Dental Injury: Teeth and Oropharynx as per pre-operative assessment

## 2021-07-17 NOTE — ED Notes (Signed)
Report given to Amber RN

## 2021-07-17 NOTE — Op Note (Signed)
PROCEDURES: 1.  Right Hemicolectomy With stapled ileocolostomy  Pre-operative Diagnosis:cecal volvulus  Post-operative Diagnosis: Same  Surgeon: Leafy Ro MD FACS  Assistants: Beather Arbour RNFA  Anesthesia: General endotracheal anesthesia  ASA Class: 2  Surgeon: Sterling Big , MD FACS  Anesthesia: Gen. 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 The benefits, complications, treatment options, and expected outcomes were discussed with the patient. The risks of bleeding, infection, recurrence of symptoms, failure to resolve symptoms,  bowel injury, any of which could require further surgery were reviewed with the patient.   The patient was taken to Operating Room, identified as Tiffany Andrews and the procedure verified.  A Time Out was held and the above information confirmed.  Prior to the induction of general anesthesia, antibiotic prophylaxis was administered. VTE prophylaxis was in place. General endotracheal anesthesia was then administered and tolerated well. After the induction, the abdomen was prepped with Chloraprep and draped in the sterile fashion. The patient was positioned in the supine position.  Midlineincision was created as a  laparotomy. The abdominal cavity was entered under direct visualization after peritoneum was inscised, no injuries were identified.  We visualized a cecal volvulus with a dimater of 11 cm , cecum was inflammed but not necrotic. I reduced the volvulus and noticed a high change of recurrence. I elected to proceed with formal colectomy.  The greater omentum was divided and the hepatic flexure was taken down using harmonic scalpel.  The white line of Toldt was incised and a lateral to medial dissection was performed.  We identified the  right ureter as well as the duodenum and preserve both structures at all times. Were Were also able to mobilize the attachments of the cecum and terminal ileum.  Once we had an adequate mobilization were able to remove to exteriorized the right colon.  A 10 cm margin on the terminal ileum was identified and we created a window with electrocautery and divided the terminal ileum.  Attention then was turned to the distal excision margin.  We identified the middle colic artery on selected a spot right to the middle colic artery.  Were able to also use a 75 GIA stapler to divide this area.  The mesentery was scored with electrocautery.  We identified the right colic artery and suture ligated with 2 oh silks in the standard.  A standard side-to-side functional end to end staple anastomosis was created with multiple loads of a 75 GIA stapler device.  We check for patency as well as leak.  There was a tension-free anastomosis with good perfusion and no evidence of intraoperative leak.  The mesenteric defect was closed with a running 2-0 Vicryl in the standard fashion. We changed gloves and place a clean closure tray.   Liposomal Marcaine was injected throughout the abdominal wall on both sides under direct visualization and palpation.  The fascia was closed with a running 0 PDS using the small bite techniques.  Incisions were closed with staples.  Needle and laparotomy counts were correct and there were no immediate complications     Sterling Big, MD, FACS
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
## 2021-07-17 NOTE — ED Triage Notes (Signed)
Pt states left upper quadrant pain and sharp stabbing pain under her left ribs with nasuea for last two hours. Pt appears in distress, is moaning and panting.
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
## 2021-07-17 NOTE — Anesthesia Postprocedure Evaluation (Signed)
Anesthesia Post Note  Patient: Tiffany Andrews  Procedure(s) Performed: EXPLORATORY LAPAROTOMY LAPAROSCOPIC RIGHT COLECTOMY (Abdomen)  Patient location during evaluation: PACU Anesthesia Type: General Level of consciousness: awake and alert Pain management: pain level controlled Vital Signs Assessment: post-procedure vital signs reviewed and stable Respiratory status: spontaneous breathing, nonlabored ventilation and respiratory function stable Cardiovascular status: blood pressure returned to baseline and stable Postop Assessment: no apparent nausea or vomiting Anesthetic complications: no   No notable events documented.   Last Vitals:  Vitals:   07/17/21 1419 07/17/21 1637  BP: 103/60 114/63  Pulse: 62 61  Resp: 18   Temp: 36.6 C 36.7 C  SpO2: 100% 100%    Last Pain:  Vitals:   07/17/21 1640  TempSrc:   PainSc: 6                  Foye Deer

## 2021-07-17 NOTE — Plan of Care (Signed)

## 2021-07-18 ENCOUNTER — Encounter: Payer: Self-pay | Admitting: Surgery

## 2021-07-18 LAB — BASIC METABOLIC PANEL
Anion gap: 2 — ABNORMAL LOW (ref 5–15)
BUN: 9 mg/dL (ref 6–20)
CO2: 25 mmol/L (ref 22–32)
Calcium: 8.1 mg/dL — ABNORMAL LOW (ref 8.9–10.3)
Chloride: 112 mmol/L — ABNORMAL HIGH (ref 98–111)
Creatinine, Ser: 0.66 mg/dL (ref 0.44–1.00)
GFR, Estimated: >60 mL/min (ref >60)
Glucose, Bld: 107 mg/dL — ABNORMAL HIGH (ref 70–99)
Potassium: 4 mmol/L (ref 3.5–5.1)
Sodium: 139 mmol/L (ref 135–145)

## 2021-07-18 LAB — CBC
HCT: 33.7 % — ABNORMAL LOW (ref 36.0–46.0)
Hemoglobin: 11.4 g/dL — ABNORMAL LOW (ref 12.0–15.0)
MCH: 30.4 pg (ref 26.0–34.0)
MCHC: 33.8 g/dL (ref 30.0–36.0)
MCV: 89.9 fL (ref 80.0–100.0)
Platelets: 170 10*3/uL (ref 150–400)
RBC: 3.75 MIL/uL — ABNORMAL LOW (ref 3.87–5.11)
RDW: 12.6 % (ref 11.5–15.5)
WBC: 8.6 10*3/uL (ref 4.0–10.5)
nRBC: 0 % (ref 0.0–0.2)

## 2021-07-18 MED ORDER — PREGABALIN 50 MG PO CAPS
50.0000 mg | ORAL_CAPSULE | Freq: Three times a day (TID) | ORAL | Status: DC
  Administered 2021-07-18: 50 mg via ORAL
  Filled 2021-07-18: qty 1

## 2021-07-18 MED ORDER — PREGABALIN 50 MG PO CAPS
100.0000 mg | ORAL_CAPSULE | Freq: Three times a day (TID) | ORAL | Status: DC
  Administered 2021-07-18 – 2021-07-21 (×9): 100 mg via ORAL
  Filled 2021-07-18 (×9): qty 2

## 2021-07-18 NOTE — Progress Notes (Signed)
POD #1 Doing well Tolerated CLD AVSS Good UO Labs ok   PE NAD Abd: soft, dressing intact, appropriate tenderness w/o peritonitis   A/P Doing well Dc foley Ambulate Awaiting return BF

## 2021-07-19 LAB — BASIC METABOLIC PANEL
Anion gap: 5 (ref 5–15)
BUN: 8 mg/dL (ref 6–20)
CO2: 26 mmol/L (ref 22–32)
Calcium: 8.1 mg/dL — ABNORMAL LOW (ref 8.9–10.3)
Chloride: 107 mmol/L (ref 98–111)
Creatinine, Ser: 0.59 mg/dL (ref 0.44–1.00)
GFR, Estimated: >60 mL/min (ref >60)
Glucose, Bld: 93 mg/dL (ref 70–99)
Potassium: 3.9 mmol/L (ref 3.5–5.1)
Sodium: 138 mmol/L (ref 135–145)

## 2021-07-19 MED ORDER — LIDOCAINE 5 % EX PTCH
2.0000 | MEDICATED_PATCH | CUTANEOUS | Status: DC
  Administered 2021-07-19 – 2021-07-20 (×2): 2 via TRANSDERMAL
  Filled 2021-07-19 (×2): qty 2

## 2021-07-19 MED ORDER — DOCUSATE SODIUM 100 MG PO CAPS
100.0000 mg | ORAL_CAPSULE | Freq: Every day | ORAL | Status: DC
  Administered 2021-07-19 – 2021-07-21 (×3): 100 mg via ORAL
  Filled 2021-07-19 (×3): qty 1

## 2021-07-19 NOTE — Progress Notes (Signed)
[facility] SURGICAL PROGRESS NOTE  Hospital Day(s): 2.   Post op day(s): 2 Days Post-Op.   Interval History:  Patient seen and examined No acute events or new complaints overnight.  Patient reports she is doing okay; still with abdominal soreness but managing well No fever, chills, nausea, emesis BMP is grossly reassuring She is on FLD; tolerating well No flatus yet Ambulating   Vital signs in last 24 hours: [min-max] current  Temp:  [97.5 F (36.4 C)-99.2 F (37.3 C)] 99.2 F (37.3 C) (06/12 0348) Pulse Rate:  [66-91] 91 (06/12 0348) Resp:  [16-18] 16 (06/12 0348) BP: (88-96)/(52-63) 96/63 (06/12 0348) SpO2:  [100 %] 100 % (06/12 0348)     Height: 5\' 6"  (167.6 cm) Weight: 89 kg BMI (Calculated): 31.68   Intake/Output last 2 shifts:  06/11 0701 - 06/12 0700 In: 401.7 [I.V.:401.7] Out: 200 [Urine:200]   Physical Exam:  Constitutional: alert, cooperative and no distress  Respiratory: breathing non-labored at rest  Cardiovascular: regular rate and sinus rhythm  Gastrointestinal: Soft, incisional soreness, non-distended, no rebound/guarding Integumentary: Laparotomy is CDI with staples, no erythema or drainage   Labs:     Latest Ref Rng & Units 07/18/2021    6:10 AM 07/17/2021    2:59 PM 07/17/2021    4:28 AM  CBC  WBC 4.0 - 10.5 K/uL 8.6  9.9  9.5   Hemoglobin 12.0 - 15.0 g/dL 09/16/2021  93.8  18.2   Hematocrit 36.0 - 46.0 % 33.7  39.2  39.7   Platelets 150 - 400 K/uL 170  179  209       Latest Ref Rng & Units 07/19/2021    6:58 AM 07/18/2021    6:10 AM 07/17/2021    2:59 PM  CMP  Glucose 70 - 99 mg/dL 93  09/16/2021    BUN 6 - 20 mg/dL 8  9    Creatinine 716 - 1.00 mg/dL 9.67  8.93  8.10   Sodium 135 - 145 mmol/L 138  139    Potassium 3.5 - 5.1 mmol/L 3.9  4.0    Chloride 98 - 111 mmol/L 107  112    CO2 22 - 32 mmol/L 26  25    Calcium 8.9 - 10.3 mg/dL 8.1  8.1       Imaging studies: No new pertinent imaging studies   Assessment/Plan: 25 y.o.  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   - Continue FLD for now; await ROBF before advancing to soft diet - Wean IVF   - Monitor abdominal examination; on-going bowel function   - Pain control prn; antiemetics prn   - Continue mobilization  - Discharge Planning: Awaiting ROBF and advancement of diet   All of the above findings and recommendations were discussed with the patient, and the medical team, and all of patient's questions were answered to her expressed satisfaction.  -- 25, PA-C [facility] 07/19/2021, 8:21 AM M-F: 7am - 4pm

## 2021-07-20 NOTE — Plan of Care (Signed)

## 2021-07-20 NOTE — Progress Notes (Signed)
[facility] SUR[facility]ICAL PROGRESS NOTE  Hospital Day(s): 3.   Post op day(s): 3 Days Post-Op.   Interval History:  Patient seen and examined No acute events or new complaints overnight.  Patient reports she is doing reasonably well; pain remains biggest barrier No fever, chills, nausea, emesis No new labs this morning She has been advanced to soft diet this morning; just ordered breakfast She has been passing flatus; BM x2 recorded; these are loose She is ambulating  Vital signs in last 24 hours: [min-max] current  Temp:  [98.1 F (36.7 C)-99 F (37.2 C)] 98.1 F (36.7 C) (06/13 0725) Pulse Rate:  [81-92] 82 (06/13 0725) Resp:  [16-20] 16 (06/13 0725) BP: (95-117)/(63-79) 117/79 (06/13 0725) SpO2:  [97 %-100 %] 100 % (06/13 0725)     Height: 5\' 6"  (167.6 cm) Weight: 89 kg BMI (Calculated): 31.68   Intake/Output last 2 shifts:  06/12 0701 - 06/13 0700 In: 960 [P.O.:960] Out: 0    Physical Exam:  Constitutional: alert, cooperative and no distress  Respiratory: breathing non-labored at rest  Cardiovascular: regular rate and sinus rhythm  Gastrointestinal: Soft, incisional soreness, non-distended, no rebound/guarding Integumentary: Laparotomy is CDI with staples, no erythema or drainage   Labs:     Latest Ref Rng & Units 07/18/2021    6:10 AM 07/17/2021    2:59 PM 07/17/2021    4:28 AM  CBC  WBC 4.0 - 10.5 K/uL 8.6  9.9  9.5   Hemoglobin 12.0 - 15.0 g/dL 09/16/2021  21.1  94.1   Hematocrit 36.0 - 46.0 % 33.7  39.2  39.7   Platelets 150 - 400 K/uL 170  179  209       Latest Ref Rng & Units 07/19/2021    6:58 AM 07/18/2021    6:10 AM 07/17/2021    2:59 PM  CMP  Glucose 70 - 99 mg/dL 93  09/16/2021    BUN 6 - 20 mg/dL 8  9    Creatinine 814 - 1.00 mg/dL 4.81  8.56  3.14   Sodium 135 - 145 mmol/L 138  139    Potassium 3.5 - 5.1 mmol/L 3.9  4.0    Chloride 98 - 111 mmol/L 107  112    CO2 22 - 32 mmol/L 26  25    Calcium 8.9 - 10.3 mg/dL 8.1  8.1       Imaging  studies: No new pertinent imaging studies   Assessment/Plan:  26 y.o. 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              - Okay to continue soft diet - Discontinue IVF            - Monitor abdominal examination; on-going bowel function             - Pain control prn; antiemetics prn             - Continue mobilization   - Discharge Planning: Patient wishes to have 1 more day, which is more than reasonable. We will treat today as a "trial" day at home. If she does well, we will anticipate DC in the AM tomorrow (06/14).    All of the above findings and recommendations were discussed with the patient, and the medical team, and all of patient's questions were answered to her expressed satisfaction.  -- 04-05-1968, PA-C [facility] 07/20/2021, 8:15 AM M-F: 7am - 4pm

## 2021-07-21 MED ORDER — ONDANSETRON 4 MG PO TBDP: 4.0000 mg | ORAL_TABLET | Freq: Four times a day (QID) | ORAL | 0 refills | Status: DC | PRN

## 2021-07-21 MED ORDER — IBUPROFEN 800 MG PO TABS: 800.0000 mg | ORAL_TABLET | Freq: Three times a day (TID) | ORAL | 0 refills | Status: DC | PRN

## 2021-07-21 MED ORDER — OXYCODONE HCL 10 MG PO TABS: 10.0000 mg | ORAL_TABLET | ORAL | 0 refills | Status: DC | PRN

## 2021-07-21 NOTE — Progress Notes (Signed)
Patient medically cleared with discharge order placed by MD.  Patient received AVS including education from primary RN.  Patient's PIV removed prior to discharge with patient transported off the unit by volunteer @1330 .

## 2021-07-21 NOTE — Plan of Care (Signed)
  Problem: Clinical Measurements: Goal: Ability to maintain clinical measurements within normal limits will improve Outcome: Progressing Goal: Respiratory complications will improve Outcome: Adequate for Discharge Goal: Cardiovascular complication will be avoided Outcome: Adequate for Discharge   Problem: Activity: Goal: Risk for activity intolerance will decrease Outcome: Adequate for Discharge

## 2021-07-21 NOTE — TOC Initial Note (Signed)
Transition of Care [facility]) - Initial/Assessment Note    Patient Details  Name: Tiffany Andrews MRN: [id number] Date of Birth: January 05, 1996  [facility] CM/SW Contact:    Chapman Fitch, RN Phone Number: 07/21/2021, 10:05 AM  Clinical Narrative:                         Patient Goals and CMS Choice        Expected Discharge Plan and Services           Expected Discharge Date: 07/21/21                                    Prior Living Arrangements/Services                       Activities of Daily Living Home Assistive Devices/Equipment: None ADL Screening (condition at time of admission) Patient's cognitive ability adequate to safely complete daily activities?: Yes Is the patient deaf or have difficulty hearing?: No Does the patient have difficulty seeing, even when wearing glasses/contacts?: No Does the patient have difficulty concentrating, remembering, or making decisions?: No Patient able to express need for assistance with ADLs?: Yes Does the patient have difficulty dressing or bathing?: No Independently performs ADLs?: Yes (appropriate for developmental age) Does the patient have difficulty walking or climbing stairs?: No Weakness of Legs: None Weakness of Arms/Hands: None  Permission Sought/Granted                  Emotional Assessment              Admission diagnosis:  Generalized abdominal pain [R10.84] Cecal volvulus (HCC) [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.2] Nonspecific chest pain [R07.9] Patient Active Problem List   Diagnosis Date Noted   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   IUD (intrauterine device) in place 06/11/2019   Postpartum depression 04/12/2018   Suicidal ideation 04/12/2018   Sleep disturbances 04/12/2018   S/P cesarean section 09/27/2017   Obesity (BMI 30.0-34.9) 04/26/2017   PCP:  [facility]:   [facility] [phone] Dan Humphreys, [place] - [address] AT [facility] OF [address] [place] [address] Phone: [phone] Fax: [phone]  [facility] [address] [place] - [address] [place] [address] Phone: [phone] Fax: [phone]     Social Determinants of Health (SDOH) Interventions    Readmission Risk Interventions     No data to display

## 2021-07-21 NOTE — Discharge Instructions (Signed)
In addition to included general post-operative instructions,  Diet: Resume home diet.   Activity: No heavy lifting >20 pounds (children, pets, laundry, garbage) for 6 weeks, but light activity and walking are encouraged. Do not drive or drink alcohol if taking narcotic pain medications or having pain that might distract from driving.  Wound care: You may shower/get incision wet with soapy water and pat dry (do not rub incisions), but no baths or submerging incision underwater until follow-up.   Medications: Resume all home medications. For mild to moderate pain: acetaminophen (Tylenol) or ibuprofen/naproxen (if no kidney disease). Combining Tylenol with alcohol can substantially increase your risk of causing liver disease. Narcotic pain medications, if prescribed, can be used for severe pain, though may cause nausea, constipation, and drowsiness. Do not combine Tylenol and Percocet (or similar) within a 6 hour period as Percocet (and similar) contain(s) Tylenol. If you do not need the narcotic pain medication, you do not need to fill the prescription.  Call office ([phone] / [phone]) at any time if any questions, worsening pain, fevers/chills, bleeding, drainage from incision site, or other concerns.  

## 2021-07-21 NOTE — Discharge Summary (Signed)
[facility] SUR[facility]ICAL DISCHARGE SUMMARY  Patient ID: Tiffany Andrews MRN: [id number] DOB/AGE: 07-09-1995 26 y.o.  Admit date: 07/17/2021 Discharge date: 07/21/2021  Discharge Diagnoses Patient Active Problem List   Diagnosis Date Noted   Cecal volvulus ([place]) 07/17/2021    Consultants None  Procedures 07/17/2021 Right Hemicolectomy with stapled ileocolostomy  HPI: Tiffany Andrews is a 26 y.o. female in consultation at the request of Dr. Leonides Schanz.  She has acute onset of abdominal pain really started yesterday and she came to the ER yesterday Pain is  severe, sharp and worsening with movement.  She did have nausea. Initially CT scan yesterday was not conclusive and a repeat CT scan was performed today and I have personally reviewed it.  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 White count is 15,500 with a left shift.  Creatinine and BUN is normal.  He does have some acidosis   Prior surgical history includes lap Landry Mellow as well as lap appendectomy and C-section.  Hospital Course: Informed consent was obtained and documented, and patient underwent uneventful exploratory laparotomy and right hemicolectomy with ileocolonic anastomosis (Dr Dahlia Byes, 07/17/2021).  Post-operatively, patient did reasonably well and had ROBF with advancement of diet. Pain control was biggest barrier. The remainder of patient's hospital course was essentially unremarkable, and discharge planning was initiated accordingly with patient safely able to be discharged home with appropriate discharge instructions, pain control, and outpatient follow-up after all of her questions were answered to her expressed satisfaction.   Discharge Condition: Good   Physical Examination:  Constitutional: alert, cooperative and no distress  Respiratory: breathing non-labored at rest  Cardiovascular: regular rate and  sinus rhythm  Gastrointestinal: Soft, incisional soreness, non-distended, no rebound/guarding Integumentary: Laparotomy is CDI with staples, no erythema or drainage    Allergies as of 07/21/2021       Reactions   Bee Venom Hives, Itching   Other Anaphylaxis, Other (See Comments), Swelling   Shellfish Allergy Anaphylaxis, Swelling   Latex    Other reaction(s): Unknown        Medication List     STOP taking these medications    doxycycline 100 MG capsule Commonly known as: VIBRAMYCIN   HYDROcodone-acetaminophen 5-325 MG tablet Commonly known as: NORCO/VICODIN       TAKE these medications    cetirizine 10 MG tablet Commonly known as: ZyrTEC Allergy Take 1 tablet (10 mg total) by mouth daily.   escitalopram 10 MG tablet Commonly known as: LEXAPRO Take 1 tablet (10 mg total) by mouth 2 (two) times daily.   fluticasone 50 MCG/ACT nasal spray Commonly known as: FLONASE Place 1 spray into both nostrils daily.   guaiFENesin 600 MG 12 hr tablet Commonly known as: Mucinex Take 1 tablet (600 mg total) by mouth 2 (two) times daily.   ibuprofen 800 MG tablet Commonly known as: ADVIL Take 1 tablet (800 mg total) by mouth every 8 (eight) hours as needed for moderate pain.   levonorgestrel 20 MCG/24HR IUD Commonly known as: MIRENA 1 each by Intrauterine route once.   metroNIDAZOLE 500 MG tablet Commonly known as: FLAGYL Take 1 tablet (500 mg total) by mouth 2 (two) times daily.   ondansetron 4 MG disintegrating tablet Commonly known as: ZOFRAN-ODT Take 1 tablet (4 mg total) by mouth every 8 (eight) hours as needed for nausea or vomiting. What changed: Another medication with the same  name was added. Make sure you understand how and when to take each.   ondansetron 4 MG disintegrating tablet Commonly known as: ZOFRAN-ODT Take 1 tablet (4 mg total) by mouth every 6 (six) hours as needed for nausea. What changed: You were already taking a medication with the same name,  and this prescription was added. Make sure you understand how and when to take each.   Oxycodone HCl 10 MG Tabs Take 1 tablet (10 mg total) by mouth every 4 (four) hours as needed for severe pain or breakthrough pain.   sotalol 80 MG tablet Commonly known as: BETAPACE Take 80 mg by mouth 2 (two) times daily.          Follow-up Information     Tylene Fantasia, PA-C. Go on 07/29/2021.   Specialty: Physician Assistant Why: Follow up in 7-10 days for staple removal; s/p right colectomy 2pm appointment Contact information: [address] [place] [address] [phone]                  Time spent on discharge management including discussion of hospital course, clinical condition, outpatient instructions, prescriptions, and follow up with the patient and members of the medical team: >30 minutes  -- Edison Simon , PA-C [facility]  07/21/2021, 11:41 AM [phone] M-F: 7am - 4pm

## 2021-07-22 ENCOUNTER — Other Ambulatory Visit: Payer: Self-pay

## 2021-07-22 ENCOUNTER — Other Ambulatory Visit: Payer: Self-pay | Admitting: Physician Assistant

## 2021-07-22 MED ORDER — FLUCONAZOLE 100 MG PO TABS: 100.0000 mg | ORAL_TABLET | Freq: Every day | ORAL | 0 refills | Status: AC

## 2021-07-24 ENCOUNTER — Emergency Department: Payer: Medicaid Other

## 2021-07-24 ENCOUNTER — Emergency Department
Admission: EM | Admit: 2021-07-24 | Discharge: 2021-07-24 | Disposition: A | Discharge status: Home / Self Care | Payer: Medicaid Other | Attending: Emergency Medicine | Admitting: Emergency Medicine

## 2021-07-24 ENCOUNTER — Other Ambulatory Visit: Payer: Self-pay

## 2021-07-24 DIAGNOSIS — L03311 Cellulitis of abdominal wall: Secondary | ICD-10-CM | POA: Diagnosis not present

## 2021-07-24 DIAGNOSIS — T8149XA Infection following a procedure, other surgical site, initial encounter: Secondary | ICD-10-CM | POA: Diagnosis not present

## 2021-07-24 DIAGNOSIS — T782XXA Anaphylactic shock, unspecified, initial encounter: Secondary | ICD-10-CM | POA: Insufficient documentation

## 2021-07-24 DIAGNOSIS — R109 Unspecified abdominal pain: Secondary | ICD-10-CM | POA: Insufficient documentation

## 2021-07-24 DIAGNOSIS — G8918 Other acute postprocedural pain: Secondary | ICD-10-CM | POA: Diagnosis not present

## 2021-07-24 DIAGNOSIS — R110 Nausea: Secondary | ICD-10-CM | POA: Diagnosis present

## 2021-07-24 LAB — COMPREHENSIVE METABOLIC PANEL
ALT: 28 U/L (ref 0–44)
AST: 20 U/L (ref 15–41)
Albumin: 3.4 g/dL — ABNORMAL LOW (ref 3.5–5.0)
Alkaline Phosphatase: 43 U/L (ref 38–126)
Anion gap: 9 (ref 5–15)
BUN: 8 mg/dL (ref 6–20)
CO2: 26 mmol/L (ref 22–32)
Calcium: 9.3 mg/dL (ref 8.9–10.3)
Chloride: 103 mmol/L (ref 98–111)
Creatinine, Ser: 0.63 mg/dL (ref 0.44–1.00)
GFR, Estimated: >60 mL/min (ref >60)
Glucose, Bld: 88 mg/dL (ref 70–99)
Potassium: 4.6 mmol/L (ref 3.5–5.1)
Sodium: 138 mmol/L (ref 135–145)
Total Bilirubin: 0.8 mg/dL (ref 0.3–1.2)
Total Protein: 7.8 g/dL (ref 6.5–8.1)

## 2021-07-24 LAB — CBC WITH DIFFERENTIAL/PLATELET
Abs Immature Granulocytes: 0.06 10*3/uL (ref 0.00–0.07)
Basophils Absolute: 0 10*3/uL (ref 0.0–0.1)
Basophils Relative: 0 %
Eosinophils Absolute: 0.2 10*3/uL (ref 0.0–0.5)
Eosinophils Relative: 1 %
HCT: 40.2 % (ref 36.0–46.0)
Hemoglobin: 13 g/dL (ref 12.0–15.0)
Immature Granulocytes: 1 %
Lymphocytes Relative: 15 %
Lymphs Abs: 1.7 10*3/uL (ref 0.7–4.0)
MCH: 28.8 pg (ref 26.0–34.0)
MCHC: 32.3 g/dL (ref 30.0–36.0)
MCV: 89.1 fL (ref 80.0–100.0)
Monocytes Absolute: 0.9 10*3/uL (ref 0.1–1.0)
Monocytes Relative: 8 %
Neutro Abs: 8.6 10*3/uL — ABNORMAL HIGH (ref 1.7–7.7)
Neutrophils Relative %: 75 %
Platelets: 346 10*3/uL (ref 150–400)
RBC: 4.51 MIL/uL (ref 3.87–5.11)
RDW: 12.1 % (ref 11.5–15.5)
WBC: 11.5 10*3/uL — ABNORMAL HIGH (ref 4.0–10.5)
nRBC: 0 % (ref 0.0–0.2)

## 2021-07-24 LAB — URINALYSIS, COMPLETE (UACMP) WITH MICROSCOPIC
Bacteria, UA: NONE SEEN
Bilirubin Urine: NEGATIVE
Glucose, UA: NEGATIVE mg/dL
Ketones, ur: 5 mg/dL — AB
Leukocytes,Ua: NEGATIVE
Nitrite: NEGATIVE
Protein, ur: NEGATIVE mg/dL
Specific Gravity, Urine: 1.01 (ref 1.005–1.030)
pH: 6 (ref 5.0–8.0)

## 2021-07-24 LAB — POC URINE PREG, ED: Preg Test, Ur: NEGATIVE

## 2021-07-24 MED ORDER — LACTATED RINGERS IV BOLUS
1000.0000 mL | Freq: Once | INTRAVENOUS | Status: AC
  Administered 2021-07-24: 1000 mL via INTRAVENOUS

## 2021-07-24 MED ORDER — EPINEPHRINE 0.3 MG/0.3ML IJ SOAJ
0.3000 mg | Freq: Once | INTRAMUSCULAR | Status: AC
  Administered 2021-07-24: 0.3 mg via INTRAMUSCULAR
  Filled 2021-07-24: qty 0.3

## 2021-07-24 MED ORDER — MORPHINE SULFATE (PF) 4 MG/ML IV SOLN
4.0000 mg | Freq: Once | INTRAVENOUS | Status: AC
  Administered 2021-07-24: 4 mg via INTRAVENOUS
  Filled 2021-07-24: qty 1

## 2021-07-24 MED ORDER — EPINEPHRINE 0.3 MG/0.3ML IJ SOAJ
0.3000 mg | INTRAMUSCULAR | 0 refills | Status: AC | PRN
Start: 2021-07-24 — End: ?

## 2021-07-24 MED ORDER — AMOXICILLIN-POT CLAVULANATE 875-125 MG PO TABS: 1.0000 | ORAL_TABLET | Freq: Two times a day (BID) | ORAL | 0 refills | Status: AC

## 2021-07-24 MED ORDER — IOHEXOL 300 MG/ML  SOLN
80.0000 mL | Freq: Once | INTRAMUSCULAR | Status: AC | PRN
  Administered 2021-07-24: 80 mL via INTRAVENOUS

## 2021-07-24 MED ORDER — FAMOTIDINE IN NACL 20-0.9 MG/50ML-% IV SOLN
20.0000 mg | Freq: Once | INTRAVENOUS | Status: AC
  Administered 2021-07-24: 20 mg via INTRAVENOUS
  Filled 2021-07-24: qty 50

## 2021-07-24 MED ORDER — DEXAMETHASONE SODIUM PHOSPHATE 10 MG/ML IJ SOLN
10.0000 mg | Freq: Once | INTRAMUSCULAR | Status: AC
  Administered 2021-07-24: 10 mg via INTRAVENOUS
  Filled 2021-07-24: qty 1

## 2021-07-24 MED ORDER — ONDANSETRON 4 MG PO TBDP: 4.0000 mg | ORAL_TABLET | Freq: Three times a day (TID) | ORAL | 0 refills | Status: AC | PRN

## 2021-07-24 MED ORDER — ONDANSETRON HCL 4 MG/2ML IJ SOLN
4.0000 mg | Freq: Once | INTRAMUSCULAR | Status: AC
  Administered 2021-07-24: 4 mg via INTRAVENOUS
  Filled 2021-07-24: qty 2

## 2021-07-24 MED ORDER — DIPHENHYDRAMINE HCL 50 MG/ML IJ SOLN
25.0000 mg | Freq: Once | INTRAMUSCULAR | Status: AC
  Administered 2021-07-24: 25 mg via INTRAVENOUS
  Filled 2021-07-24: qty 1

## 2021-07-24 NOTE — ED Triage Notes (Addendum)
Pt arrives with a change in the color of her drainage from her surgical site on her abd from a cholecystectomy 1 week ago- drainage was pink tinged but is now red and there was a clot in it- pt states she did feel a pop beforehand- pt has a clean dressing over the area

## 2021-07-24 NOTE — ED Notes (Signed)
Pt states she feels a lot better. Pt denies any SOB or CP. Skin appropriate for ethnicity.

## 2021-07-24 NOTE — ED Notes (Signed)
Pt presents to ED with a post-op problem. Pt had abdominal surgery about a week ago. This morning pt noticed drainage from her wound. Pt denies any fever. Redness noted at incision site, no drainage present at this time.

## 2021-07-24 NOTE — ED Provider Notes (Signed)
-----------------------------------------   8:59 PM on 07/24/2021 -----------------------------------------  Blood pressure 114/68, pulse 85, temperature 98.5 F (36.9 C), temperature source Oral, resp. rate 14, height 5\' 6"  (1.676 m), weight 89 kg, last menstrual period 07/16/2021, SpO2 99 %.  Assuming care from Dr. 09/15/2021.  In short, Tiffany Andrews is a 26 y.o. female with a chief complaint of Post-op Problem .  Refer to the original H&P for additional details.  The current plan of care is to continue to observe the patient.  Patient presented with postop complication.  After CT she had an anaphylactic reaction and was treated with epinephrine, famotidine, Benadryl, dexamethasone.  Patient has a complete resolution of her allergic reaction symptoms.  She has been stable through her observation.  At this time ongoing instructions regarding her postop wound are discussed, concerning signs and symptoms for return of any kind of reaction are discussed.  Follow-up with surgery as recommended before.      25, PA-C 07/25/21 0005    07/27/21, MD 07/25/21 [phone]

## 2021-07-24 NOTE — ED Notes (Signed)
Pt's POC was negative.

## 2021-07-24 NOTE — ED Notes (Signed)
Pt verbalized understanding of discharge instructions, prescriptions, and follow-up care instructions pt advised if symptoms worsen to return to ED.

## 2021-07-24 NOTE — ED Triage Notes (Addendum)
First Nurse Note:  Pt had an R colectomy one week ago and now having pain and drainage from sight. Pt's surgery was with Dr. Everlene Farrier, MD. Pt is A&Ox4 and NAD. Ambulatory to triage.

## 2021-07-24 NOTE — ED Provider Notes (Addendum)
[facility] Provider Note    Event Date/Time   First MD Initiated Contact with Patient 07/24/21 1541     (approximate)   History   Post-op Problem   HPI  Tiffany Andrews is a 26 y.o. 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  Patient states she has had some nausea since her discharge but this has been fairly well controlled with nausea medicine and that she had some mild abdominal discomfort which she was managing with ibuprofen and oxycodone but before today did not have any significant drainage over last couple days.  She states she has been moving her bowels.  She denies any new vomiting, fevers, cough, chest pain, shortness of breath or urinary symptoms.  She states the drainage was initially clearish yellow but then turned bloody and she think she passed a clot and felt a pop.  She has since cleared up and she had a new dressing placed prior to my assessment and has not had any subsequent drainage.  No trauma.  He reports to the area.   Physical Exam  Triage Vital Signs: ED Triage Vitals  Enc Vitals Group     BP 07/24/21 1534 109/77     Pulse Rate 07/24/21 1534 89     Resp 07/24/21 1534 16     Temp 07/24/21 1534 98.3 F (36.8 C)     Temp Source 07/24/21 1534 Oral     SpO2 07/24/21 1534 99 %     Weight 07/24/21 1536 196 lb 3.4 oz (89 kg)     Height 07/24/21 1536 5\' 6"  (1.676 m)     Head Circumference --      Peak Flow --      Pain Score 07/24/21 1535 3     Pain Loc --      Pain Edu? --      Excl. in GC? --     Most recent vital signs: Vitals:   07/24/21 1804 07/24/21 1809  BP:  98/73  Pulse: (!) 103 (!) 104  Resp: 19 18  Temp:    SpO2: 95% 91%    General: Awake, no distress.  CV:  Good peripheral  perfusion.  2+ radial pulse. Resp:  Normal effort.  Abd:  No distention.  Other:  There is some erythema along both sides of the abdominal incision site.  There is no drainage or bleeding at this time.  Some tenderness throughout the abdomen but more so centered around the incision site   ED Results / Procedures / Treatments  Labs (all labs ordered are listed, but only abnormal results are displayed) Labs Reviewed  CBC WITH DIFFERENTIAL/PLATELET - Abnormal; Notable for the following components:      Result Value   WBC 11.5 (*)    Neutro Abs 8.6 (*)    All other components within normal limits  COMPREHENSIVE METABOLIC PANEL - Abnormal; Notable for the following components:   Albumin 3.4 (*)    All other components within normal limits  URINALYSIS, COMPLETE (UACMP) WITH MICROSCOPIC - Abnormal; Notable for the following components:   Color, Urine YELLOW (*)    APPearance HAZY (*)    Hgb urine dipstick SMALL (*)    Ketones, ur 5 (*)    All other components  within normal limits  POC URINE PREG, ED     EKG    RADIOLOGY  CT abdomen pelvis my interpretation without evidence of an SBO or clear intra-abdominal abscess, perforation or abdominal free fluid however there does appear to be a very small abdominal wall fluid collection.  I reviewed radiology interpretation and agree with their findings of expected postsurgical changes of the right hemicolectomy and a small subcutaneous fluid collection deep to the staple line measuring 2.4 x 2.3 cm.   PROCEDURES:  Critical Care performed: No  .Critical Care  Performed by: Gilles Chiquito, MD Authorized by: Gilles Chiquito, MD   Critical care provider statement:    Critical care time (minutes):  30   Critical care was necessary to treat or prevent imminent or life-threatening deterioration of the following conditions: anaphylaxis.   Critical care was time spent personally by me on the following activities:  Development of treatment  plan with patient or surrogate, discussions with consultants, evaluation of patient's response to treatment, examination of patient, ordering and review of laboratory studies, ordering and review of radiographic studies, ordering and performing treatments and interventions, pulse oximetry, re-evaluation of patient's condition and review of old charts    MEDICATIONS ORDERED IN ED: Medications  morphine (PF) 4 MG/ML injection 4 mg (4 mg Intravenous Given 07/24/21 1635)  lactated ringers bolus 1,000 mL (0 mLs Intravenous Stopped 07/24/21 1818)  ondansetron (ZOFRAN) injection 4 mg (4 mg Intravenous Given 07/24/21 1633)  iohexol (OMNIPAQUE) 300 MG/ML solution 80 mL (80 mLs Intravenous Contrast Given 07/24/21 1731)  EPINEPHrine (EPI-PEN) injection 0.3 mg (0.3 mg Intramuscular Given 07/24/21 1753)  famotidine (PEPCID) IVPB 20 mg premix (0 mg Intravenous Stopped 07/24/21 1807)  dexamethasone (DECADRON) injection 10 mg (10 mg Intravenous Given 07/24/21 1801)  diphenhydrAMINE (BENADRYL) injection 25 mg (25 mg Intravenous Given 07/24/21 1755)  morphine (PF) 4 MG/ML injection 4 mg (4 mg Intravenous Given 07/24/21 1800)  lactated ringers bolus 1,000 mL (1,000 mLs Intravenous New Bag/Given 07/24/21 1818)     IMPRESSION / MDM / ASSESSMENT AND PLAN / ED COURSE  I reviewed the triage vital signs and the nursing notes. Patient's presentation is most consistent with acute presentation with potential threat to life or bodily function.                               Differential diagnosis includes, but is not limited to ruptured and no resolved seroma or hematoma, cellulitis, abscess versus possible deeper infection.  CBC with WBC count of 11.5, hemoglobin of 13 and normal platelets.  CMP shows no significant electrolyte or metabolic derangements.  Pregnancy test is negative.  UA has some ketones and small hemoglobin but otherwise no evidence of infection.  CT abdomen pelvis my interpretation without evidence of an SBO  or clear intra-abdominal abscess, perforation or abdominal free fluid however there does appear to be a very small abdominal wall fluid collection.  I reviewed radiology interpretation and agree with their findings of expected postsurgical changes of the right hemicolectomy and a small subcutaneous fluid collection deep to the staple line measuring 2.4 x 2.3 cm.  Discussed patient's presentation work-up with on-call general surgeon Dr. Aleen Campi who did come and evaluate the patient at bedside.  He recommended covering with a course of Augmentin given the somewhat difficult to exclude early developing cellulitis but otherwise most likely to be a seroma.  Patient to follow-up in outpatient surgery clinic next  week.  We will refill her Zofran as well in addition to Rx for Augmentin while she has adequate analgesia at this time.  While undergoing CT for patient's evaluation she did report sensation of tightness in her throat increasing nausea and had some redness burning across her chest.  She notes she has never had a reaction like this before.  This concerning for early anaphylaxis and will treat appropriately with epinephrine and adjuncts.   Several reassessment she is feeling drastically better.  Allergy added to EHR.  We will plan to observe for 3 hours and if patient does not require repeat epinephrine dosing discharge with Rx for EpiPen.  Patient updated on plan of care.  Care patient signed over to sending provider at approximately 0 730 with plan to observe until 850 and if patient has not required any additional doses of epinephrine discharged with close outpatient surgery follow-up.      FINAL CLINICAL IMPRESSION(S) / ED DIAGNOSES   Final diagnoses:  Post-operative pain  Anaphylaxis, initial encounter     Rx / DC Orders   ED Discharge Orders          Ordered    amoxicillin-clavulanate (AUGMENTIN) 875-125 MG tablet  2 times daily        07/24/21 1902    EPINEPHrine 0.3 mg/0.3 mL IJ  SOAJ injection  As needed        07/24/21 1902    ondansetron (ZOFRAN-ODT) 4 MG disintegrating tablet  Every 8 hours PRN        07/24/21 1906             Note:  This document was prepared using Dragon voice recognition software and may include unintentional dictation errors.   Gilles Chiquito, MD 07/24/21 Prentice Docker    Gilles Chiquito, MD 07/24/21 1946

## 2021-07-25 DIAGNOSIS — L03311 Cellulitis of abdominal wall: Secondary | ICD-10-CM | POA: Insufficient documentation

## 2021-07-25 DIAGNOSIS — T8149XA Infection following a procedure, other surgical site, initial encounter: Secondary | ICD-10-CM | POA: Insufficient documentation
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
## 2021-07-26 LAB — SURGICAL PATHOLOGY

## 2021-07-28 ENCOUNTER — Encounter: Payer: Self-pay | Admitting: Surgery

## 2021-07-28 ENCOUNTER — Ambulatory Visit (INDEPENDENT_AMBULATORY_CARE_PROVIDER_SITE_OTHER): Payer: Medicaid Other | Admitting: Surgery

## 2021-07-28 VITALS — BP 113/80 | HR 93 | Temp 98.2°F | Ht 66.0 in | Wt 202.0 lb

## 2021-07-28 DIAGNOSIS — T8149XA Infection following a procedure, other surgical site, initial encounter: Secondary | ICD-10-CM

## 2021-07-28 DIAGNOSIS — T8149XD Infection following a procedure, other surgical site, subsequent encounter: Secondary | ICD-10-CM

## 2021-07-28 DIAGNOSIS — Z09 Encounter for follow-up examination after completed treatment for conditions other than malignant neoplasm: Secondary | ICD-10-CM

## 2021-07-28 DIAGNOSIS — K562 Volvulus: Secondary | ICD-10-CM

## 2021-07-28 NOTE — Progress Notes (Signed)
07/28/2021  HPI: Tiffany Andrews is a 26 y.o. female s/p right colectomy for cecal volvulus.  I saw her in the ED on 6/17 for a post-op wound infection.  Two skin staples removed and some seropurulent fluid drained.  The wound was packed.  She presents today because she was unable to pack the wound and she noticed blood coming from wound.  Denies any worsening pain and reports that the redness and pain improved with the antibiotics.  She is having some diarrhea from the antibiotic.  Vital signs: BP 113/80   Pulse 93   Temp 98.2 F (36.8 C)   Ht 5\' 6"  (1.676 m)   Wt 202 lb (91.6 kg)   LMP 07/16/2021   SpO2 98%   BMI 32.60 kg/m    Physical Exam: Constitutional:  No acute distress Abdomen:  Soft, non-distended, with some minimal tenderness.  Incision is now without erythema or induration.  The open area is about 1.2 cm long.  Probing with  qtip still show intact fascia.  The wound was dried with 4x4 gauze without any further bleeding.  The wound was then packed with 1/2 inch plain strip gauze, covered with gauze dressing.  Assessment/Plan: This is a 26 y.o. female s/p right colectomy with wound infection.  --Discussed with the patient that likely the bleeding happened as the gauze was pulling on the raw tissue.  No signs of bleeding at this point.  Instructed better on how to pack the wound with the strip gauze. --Continue antibiotics until course completed. --Work note given. --Follow up next week with Mr. 26 for wound check.   Manus Rudd, MD [facility]

## 2021-07-28 NOTE — Patient Instructions (Addendum)
Pack the wound every day. Be sure to pack all areas so the packing material is filling the wound.  Finish all of your antibiotics. You may take Imodium for any diarrhea.   You have weight restrictions of no lifting more than 10-15 pounds for 4 weeks.   You may return to work on July 3rd.  GENERAL POST-OPERATIVE PATIENT INSTRUCTIONS   WOUND CARE INSTRUCTIONS:  Keep a dry clean dressing on the wound if there is drainage. The initial bandage may be removed after 24 hours.  Once the wound has quit draining you may leave it open to air.  If clothing rubs against the wound or causes irritation and the wound is not draining you may cover it with a dry dressing during the daytime.  Try to keep the wound dry and avoid ointments on the wound unless directed to do so.  If the wound becomes bright red and painful or starts to drain infected material that is not clear, please contact your physician immediately.  If the wound is mildly pink and has a thick firm ridge underneath it, this is normal, and is referred to as a healing ridge.  This will resolve over the next 4-6 weeks.  BATHING: You may shower if you have been informed of this by your surgeon. However, Please do not submerge in a tub, hot tub, or pool until incisions are completely sealed or have been told by your surgeon that you may do so.  DIET:  You may eat any foods that you can tolerate.  It is a good idea to eat a high fiber diet and take in plenty of fluids to prevent constipation.  If you do become constipated you may want to take a mild laxative or take ducolax tablets on a daily basis until your bowel habits are regular.  Constipation can be very uncomfortable, along with straining, after recent surgery.  ACTIVITY:  You are encouraged to cough and deep breath or use your incentive spirometer if you were given one, every 15-30 minutes when awake.  This will help prevent respiratory complications and low grade fevers post-operatively if you  had a general anesthetic.  You may want to hug a pillow when coughing and sneezing to add additional support to the surgical area, if you had abdominal or chest surgery, which will decrease pain during these times.  You are encouraged to walk and engage in light activity for the next two weeks.  You should not lift more than 20 pounds for 4-6 weeks total after surgery as it could put you at increased risk for complications.  Twenty pounds is roughly equivalent to a plastic bag of groceries. At that time- Listen to your body when lifting, if you have pain when lifting, stop and then try again in a few days. Soreness after doing exercises or activities of daily living is normal as you get back in to your normal routine.  MEDICATIONS:  Try to take narcotic medications and anti-inflammatory medications, such as tylenol, ibuprofen, naprosyn, etc., with food.  This will minimize stomach upset from the medication.  Should you develop nausea and vomiting from the pain medication, or develop a rash, please discontinue the medication and contact your physician.  You should not drive, make important decisions, or operate machinery when taking narcotic pain medication.  SUNBLOCK Use sun block to incision area over the next year if this area will be exposed to sun. This helps decrease scarring and will allow you avoid a permanent darkened  area over your incision.  QUESTIONS:  Please feel free to call our office if you have any questions, and we will be glad to assist you. [phone]

## 2021-07-29 ENCOUNTER — Encounter: Payer: Medicaid Other | Admitting: Physician Assistant

## 2021-08-03 ENCOUNTER — Encounter: Payer: Self-pay | Admitting: Physician Assistant

## 2021-08-03 ENCOUNTER — Other Ambulatory Visit: Payer: Self-pay

## 2021-08-03 ENCOUNTER — Ambulatory Visit (INDEPENDENT_AMBULATORY_CARE_PROVIDER_SITE_OTHER): Payer: Medicaid Other | Admitting: Physician Assistant

## 2021-08-03 VITALS — BP 111/75 | HR 103 | Temp 98.2°F | Ht 62.0 in | Wt 200.0 lb

## 2021-08-03 DIAGNOSIS — K562 Volvulus: Secondary | ICD-10-CM

## 2021-08-03 DIAGNOSIS — T8149XD Infection following a procedure, other surgical site, subsequent encounter: Secondary | ICD-10-CM

## 2021-08-03 DIAGNOSIS — T8149XA Infection following a procedure, other surgical site, initial encounter: Secondary | ICD-10-CM

## 2021-08-03 DIAGNOSIS — Z09 Encounter for follow-up examination after completed treatment for conditions other than malignant neoplasm: Secondary | ICD-10-CM

## 2021-08-03 NOTE — Progress Notes (Signed)
[facility] POST-OP OFFICE VISIT  08/04/2021  HPI: Tiffany Andrews is a 26 y.o. 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   Unfortunately, she did present to the ED on 06/17 for concerns over changes to the laparotomy wound. Dr Aleen Campi removed two staples from the inferior portion which revealed thicker appearing serosanguinous fluid. She was started on Augmentin. She followed up on 06/21 and was doing well.   This afternoon, she reports that she is doing reasonably well. Has some tightness in her abdomen with sitting up but otherwise pain is doing better.  No fever, chills, nausea, emesis She is having bowel function Doing daily dressing changes; serosanguinous drainage No other complaints   Vital signs: BP 111/75   Pulse (!) 103   Temp 98.2 F (36.8 C) (Oral)   Ht 5\' 2"  (1.575 m)   Wt 200 lb (90.7 kg)   LMP 07/16/2021   SpO2 98%   BMI 36.58 kg/m    Physical Exam: Constitutional: Well appearing female, NAD Abdomen: Soft, incisional soreness, non-distended, no rebound/guarding Skin: Laparotomy is healing well with staples (removed and replaced with steri-strips); there is a small 2 cm opening to the inferior portion with packing. Removed packing which revealed a well granulating wound, fascia intact, no erythema.   Assessment/Plan: This is a 26 y.o. 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    - Staples removed; steri-strips replaced  - Continue packing; preformed today. Supplies given. She seems to be doing a good job of this at home.   - Pain control prn  - Reviewed lifting restrictions; 6 weeks total  - I will see her again in ~2 w\eeks for wound check and to ensure she continues to progress.   -- Everlene Farrier, PA-C [facility] 08/04/2021, 8:34 AM M-F: 7am - 4pm

## 2021-08-04 ENCOUNTER — Encounter: Payer: Self-pay | Admitting: Physician Assistant

## 2021-08-16 ENCOUNTER — Telehealth: Payer: Self-pay | Admitting: Surgery

## 2021-08-16 NOTE — Telephone Encounter (Signed)
Incoming call from patient. She had exploratory laparotomy done on 07/17/21 with Dr. Everlene Farrier.  She has follow up on 08/17/21 @ 2:15 with Ian Malkin.  But patient states that since Friday off and on with some nausea that will last for a few hours.  She states there is a "bad burning sensation" right side, her pain level at about a 6 she states and there is an increase with bloody fluids.  Please call her.  Thank you.

## 2021-08-17 ENCOUNTER — Other Ambulatory Visit: Payer: Self-pay

## 2021-08-17 ENCOUNTER — Ambulatory Visit (INDEPENDENT_AMBULATORY_CARE_PROVIDER_SITE_OTHER): Payer: Medicaid Other | Admitting: Physician Assistant

## 2021-08-17 ENCOUNTER — Encounter: Payer: Self-pay | Admitting: Physician Assistant

## 2021-08-17 VITALS — BP 111/77 | HR 64 | Temp 98.7°F | Ht 66.0 in | Wt 203.2 lb

## 2021-08-17 DIAGNOSIS — Z09 Encounter for follow-up examination after completed treatment for conditions other than malignant neoplasm: Secondary | ICD-10-CM

## 2021-08-17 DIAGNOSIS — T8149XA Infection following a procedure, other surgical site, initial encounter: Secondary | ICD-10-CM

## 2021-08-17 DIAGNOSIS — K562 Volvulus: Secondary | ICD-10-CM

## 2021-08-17 DIAGNOSIS — T8149XD Infection following a procedure, other surgical site, subsequent encounter: Secondary | ICD-10-CM

## 2021-08-17 NOTE — Progress Notes (Signed)
[facility] POST-OP OFFICE VISIT  08/17/2021  HPI: Tiffany Andrews is a 26 y.o. 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   She continues to do well.  She did have an episode over the weekend with increased pain and drainage from wound, but this seems to be isolated incident No fever, chills, nausea, emesis She continues to do well with packing No other complaints   Vital signs: BP 111/77   Pulse 64   Temp 98.7 F (37.1 C) (Oral)   Ht 5\' 6"  (1.676 m)   Wt 203 lb 3.2 oz (92.2 kg)   SpO2 99%   BMI 32.80 kg/m    Physical Exam: Constitutional: Well appearing female, NAD Abdomen: Soft, non-tender, non-distended, no rebound/guarding Skin: Laparotomy is healing well; there is a small ~1 cm opening to the inferior portion of the wound, depth is 4.5 cm. Removed packing which revealed a well granulating wound, fascia intact, no erythema.   Assessment/Plan: This is a 26 y.o. 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 Supplies given. She seems to be doing a good job of this at home.              - Pain control prn  - Reviewed lifting restrictions; 6 weeks total             - I will see her again in ~3 weeks for wound check and to ensure she continues to progress.   -- 25, PA-C  [facility] 08/17/2021, 3:06 PM M-F: 7am - 4pm

## 2021-08-17 NOTE — Patient Instructions (Signed)
Please see your follow up appointment listed below.  °

## 2021-08-26 ENCOUNTER — Ambulatory Visit (INDEPENDENT_AMBULATORY_CARE_PROVIDER_SITE_OTHER): Payer: Medicaid Other | Admitting: Physician Assistant

## 2021-08-26 ENCOUNTER — Encounter: Payer: Self-pay | Admitting: Physician Assistant

## 2021-08-26 VITALS — BP 100/67 | HR 112 | Temp 98.3°F | Wt 205.2 lb

## 2021-08-26 DIAGNOSIS — Z09 Encounter for follow-up examination after completed treatment for conditions other than malignant neoplasm: Secondary | ICD-10-CM

## 2021-08-26 DIAGNOSIS — T8149XA Infection following a procedure, other surgical site, initial encounter: Secondary | ICD-10-CM

## 2021-08-26 DIAGNOSIS — K562 Volvulus: Secondary | ICD-10-CM

## 2021-08-26 DIAGNOSIS — T8149XD Infection following a procedure, other surgical site, subsequent encounter: Secondary | ICD-10-CM

## 2021-08-26 NOTE — Progress Notes (Signed)
[facility] POST-OP OFFICE VISIT  08/26/2021  HPI: Tiffany Andrews is a 26 y.o. 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   She started to notice "greenish" drainage on her dressing which worried her She reports wound continues to close; using less packing No fever, chills No other complaints   Vital signs: BP 100/67   Pulse (!) 112   Temp 98.3 F (36.8 C) (Oral)   Wt 205 lb 3.2 oz (93.1 kg)   SpO2 99%   BMI 33.12 kg/m    Physical Exam: Constitutional: Well appearing female, NAD Abdomen: SLaparotomy is healing well; there is a small ~1 cm opening to the inferior portion of the wound, depth is 4 cm. Removed packing which showed drainage consistent with fibrinous exudate, no purulence, no erythema. No evidence of infection   Assessment/Plan: This is a 26 y.o. 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

## 2021-09-09 ENCOUNTER — Encounter: Payer: Self-pay | Admitting: Physician Assistant

## 2021-09-09 ENCOUNTER — Ambulatory Visit (INDEPENDENT_AMBULATORY_CARE_PROVIDER_SITE_OTHER): Payer: Medicaid Other | Admitting: Physician Assistant

## 2021-09-09 VITALS — BP 112/77 | HR 80 | Temp 98.0°F | Ht 66.0 in | Wt 206.0 lb

## 2021-09-09 DIAGNOSIS — K562 Volvulus: Secondary | ICD-10-CM

## 2021-09-09 DIAGNOSIS — T8149XA Infection following a procedure, other surgical site, initial encounter: Secondary | ICD-10-CM

## 2021-09-09 DIAGNOSIS — T8149XD Infection following a procedure, other surgical site, subsequent encounter: Secondary | ICD-10-CM

## 2021-09-09 DIAGNOSIS — Z09 Encounter for follow-up examination after completed treatment for conditions other than malignant neoplasm: Secondary | ICD-10-CM

## 2021-09-09 NOTE — Progress Notes (Signed)
[place] [facility] POST-OP OFFICE VISIT  09/09/2021  HPI: Tiffany Andrews is a 26 y.o. 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 with Dr Everlene Farrier, which has been complicated by wound infection and a portion of incision healing via secondary intention   She is doing well Using less packing No fever, chills She is anxious to be done with this process  Vital signs: BP 112/77   Pulse 80   Temp 98 F (36.7 C)   Ht 5\' 6"  (1.676 m)   Wt 206 lb (93.4 kg)   SpO2 98%   BMI 33.25 kg/m    Physical Exam: Constitutional: Well appearing female, NAD Abdomen: Soft, non-tender, non-distended, no rebound/guarding Skin: Laparotomy is healing well; there is a small 0.5 cm opening to the inferior portion of the wound, depth is 2 cm. No erythema.   Assessment/Plan: This is a 26 y.o. 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 She can transition to superficial dressings I suspect in a few days             - I will see her again in ~2 weeks for hopefully final sound check; She understands to call with questions/concerns in the interim   -- Everlene Farrier, PA-C [facility] 09/09/2021, 11:04 AM M-F: 7am - 4pm

## 2021-09-09 NOTE — Patient Instructions (Signed)
If you have any concerns or questions, please feel free to call our office.   Exploratory Laparotomy, Adult, Care After The following information offers guidance on how to care for yourself after your procedure. Your health care provider may also give you more specific instructions. If you have problems or questions, contact your health care provider. What can I expect after the procedure? After the procedure, it is common to have: Abdominal soreness. Fatigue. Bloating. Gas. A sore throat from having had a breathing or draining tube in your throat. A lack of appetite. Follow these instructions at home: Medicines Take over-the-counter and prescription medicines only as told by your health care provider. If you were prescribed an antibiotic medicine, take it as told by your health care provider. Do not stop taking the antibiotic even if you start to feel better. Ask your health care provider if the medicine prescribed to you: Requires you to avoid driving or using machinery. Can cause constipation. You may need to take these actions to prevent or treat constipation: Drink enough fluid to keep your urine pale yellow. Take over-the-counter or prescription medicines. Undergoing surgery and taking pain medicines can make constipation worse. Eat foods that are high in fiber, such as beans, whole grains, and fresh fruits and vegetables. Limit foods that are high in fat and processed sugars, such as fried or sweet foods. Incision care  Follow instructions from your health care provider about how to take care of your incision. Make sure you: Wash your hands with soap and water for at least 20 seconds before and after you change your bandage (dressing). If soap and water are not available, use hand sanitizer. Change your dressing as told by your health care provider. Leave stitches (sutures), skin glue, or adhesive strips in place. These skin closures may need to stay in place for 2 weeks or longer.  If adhesive strip edges start to loosen and curl up, you may trim the loose edges. Do not remove adhesive strips completely unless your health care provider tells you to do that. If you were sent home with a drain, follow instructions from your health care provider about how to care for it. Check your incision area every day for signs of infection. Check for: Redness, swelling, or pain. Fluid or blood. Warmth. Pus or a bad smell. Activity  Rest as told by your health care provider. Avoid sitting for a long time without moving. Get up to take short walks every 1-2 hours. This is important to improve blood flow and breathing. Ask for help if you feel weak or unsteady. Do not lift anything that is heavier than 5 lb (2.3 kg), or the limit that you are told, until your health care provider says that it is safe. Return to your normal activities as told by your health care provider. Ask your health care provider what activities are safe for you. Bathing Keep your incision clean and dry. Clean it as often as told by your health care provider. You may be told to: Gently wash the incision with soap and water. Rinse the incision with water to remove all soap. Pat the incision dry with a clean towel. Do not rub the incision. General instructions Do not use any products that contain nicotine or tobacco. These products include cigarettes, chewing tobacco, and vaping devices, such as e-cigarettes. These can delay incision healing after surgery. If you need help quitting, ask your health care provider. Wear compression stockings as told by your health care provider. These   stockings help to prevent blood clots and reduce swelling in your legs. Keep all follow-up visits. This is important. Contact a health care provider if: You have a fever or chills. Your pain medicine is not helping. You have constipation or diarrhea. You have nausea or vomiting. You have drainage, redness, swelling, or pain at your  incision site. Get help right away if: Your pain is getting worse. You have not had a bowel movement for more than 3 days. You have ongoing (persistent) vomiting. The edges of your incision open up. You have warmth, tenderness, or swelling in your calf. You have trouble breathing. You have chest pain. These symptoms may represent a serious problem that is an emergency. Do not wait to see if the symptoms will go away. Get medical help right away. Call your local emergency services (911 in the [place] Do not drive yourself to the hospital. Summary Abdominal soreness is common after exploratory laparotomy. Take over-the-counter and prescription medicines only as told by your health care provider. Follow instructions from your health care provider about how to take care of your incision. Do not lift anything that is heavier than 5 lb (2.3 kg), or the limit that you are told, until your health care provider says that it is safe. This information is not intended to replace advice given to you by your health care provider. Make sure you discuss any questions you have with your health care provider. Document Revised: 10/08/2019 Document Reviewed: 10/08/2019 Elsevier Patient Education  2023 Elsevier Inc.  

## 2021-09-23 ENCOUNTER — Ambulatory Visit (INDEPENDENT_AMBULATORY_CARE_PROVIDER_SITE_OTHER): Payer: Medicaid Other | Admitting: Physician Assistant

## 2021-09-23 ENCOUNTER — Encounter: Payer: Self-pay | Admitting: Physician Assistant

## 2021-09-23 VITALS — BP 113/75 | HR 67 | Temp 98.3°F | Ht 66.0 in | Wt 209.0 lb

## 2021-09-23 DIAGNOSIS — T8149XD Infection following a procedure, other surgical site, subsequent encounter: Secondary | ICD-10-CM

## 2021-09-23 DIAGNOSIS — K562 Volvulus: Secondary | ICD-10-CM

## 2021-09-23 DIAGNOSIS — T8149XA Infection following a procedure, other surgical site, initial encounter: Secondary | ICD-10-CM

## 2021-09-23 DIAGNOSIS — Z09 Encounter for follow-up examination after completed treatment for conditions other than malignant neoplasm: Secondary | ICD-10-CM

## 2021-09-23 NOTE — Patient Instructions (Addendum)
We will have you return her in 3 months. We will send you a letter about this appointment.     Please call and ask to speak with a nurse if you develop questions or concerns.

## 2021-09-23 NOTE — Progress Notes (Signed)
[facility] POST-OP OFFICE VISIT  09/23/2021  HPI: Tiffany Andrews is a 26 y.o. 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 She does not recall much of the event but her fiance did witness this and noted her hit her left lower chest/stomach. She denied any other injuries. She did not seek medical attention. No head injury, weakness, numbness, neck pain. Some intermittent nausea since. No emesis or bowel changes.   Vital signs: BP 113/75   Pulse 67   Temp 98.3 F (36.8 C)   Ht 5\' 6"  (1.676 m)   Wt 209 lb (94.8 kg)   SpO2 100%   BMI 33.73 kg/m    Physical Exam: Constitutional: Well appearing female, NAD Abdomen: Soft, she has LUQ soreness today related to recent fall, tenderness over lower ribs as well but this is mild, non-distended, no rebound/guarding. There is no evidence of hernia on examination  Skin: Laparotomy is now completely closed, no erythema, no drainage   Assessment/Plan: This is a 26 y.o. 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 Tylenol, Ibuprofen, Ice packs for pain  - Her midline incision is now completely closed; nothing further  - I will see her again in 3 months to ensure she has continued to do well. She understands she can call in the interim as needed   -- Everlene Farrier, PA-C [facility] 09/23/2021, 2:11 PM M-F: 7am - 4pm

## 2021-12-23 ENCOUNTER — Other Ambulatory Visit: Payer: Self-pay

## 2021-12-23 ENCOUNTER — Encounter: Payer: Self-pay | Admitting: Physician Assistant

## 2021-12-23 ENCOUNTER — Ambulatory Visit (INDEPENDENT_AMBULATORY_CARE_PROVIDER_SITE_OTHER): Payer: Medicaid Other | Admitting: Physician Assistant

## 2021-12-23 VITALS — BP 138/83 | HR 73 | Temp 98.9°F | Ht 66.5 in | Wt 216.0 lb

## 2021-12-23 DIAGNOSIS — K562 Volvulus: Secondary | ICD-10-CM | POA: Diagnosis not present

## 2021-12-23 MED ORDER — CYCLOBENZAPRINE HCL 5 MG PO TABS
5.0000 mg | ORAL_TABLET | Freq: Every day | ORAL | 0 refills | Status: DC
Start: 2021-12-23 — End: 2022-08-19
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
## 2022-01-13 DIAGNOSIS — I480 Paroxysmal atrial fibrillation: Secondary | ICD-10-CM

## 2022-01-13 HISTORY — DX: Paroxysmal atrial fibrillation: I48.0

## 2022-01-16 ENCOUNTER — Emergency Department
Admission: EM | Admit: 2022-01-16 | Discharge: 2022-01-16 | Disposition: A | Discharge status: Home / Self Care | Payer: Medicaid Other | Attending: Emergency Medicine | Admitting: Emergency Medicine

## 2022-01-16 ENCOUNTER — Emergency Department: Payer: Medicaid Other

## 2022-01-16 DIAGNOSIS — R109 Unspecified abdominal pain: Secondary | ICD-10-CM | POA: Diagnosis not present

## 2022-01-16 DIAGNOSIS — Z3A01 Less than 8 weeks gestation of pregnancy: Secondary | ICD-10-CM | POA: Insufficient documentation

## 2022-01-16 DIAGNOSIS — O26891 Other specified pregnancy related conditions, first trimester: Secondary | ICD-10-CM | POA: Diagnosis present

## 2022-01-16 DIAGNOSIS — O3680X0 Pregnancy with inconclusive fetal viability, not applicable or unspecified: Secondary | ICD-10-CM

## 2022-01-16 LAB — CBC
HCT: 44.3 % (ref 36.0–46.0)
Hemoglobin: 14.2 g/dL (ref 12.0–15.0)
MCH: 29 pg (ref 26.0–34.0)
MCHC: 32.1 g/dL (ref 30.0–36.0)
MCV: 90.4 fL (ref 80.0–100.0)
Platelets: 281 10*3/uL (ref 150–400)
RBC: 4.9 MIL/uL (ref 3.87–5.11)
RDW: 12.3 % (ref 11.5–15.5)
WBC: 10.2 10*3/uL (ref 4.0–10.5)
nRBC: 0 % (ref 0.0–0.2)

## 2022-01-16 LAB — BASIC METABOLIC PANEL
Anion gap: 6 (ref 5–15)
BUN: 7 mg/dL (ref 6–20)
CO2: 24 mmol/L (ref 22–32)
Calcium: 8.8 mg/dL — ABNORMAL LOW (ref 8.9–10.3)
Chloride: 108 mmol/L (ref 98–111)
Creatinine, Ser: 0.76 mg/dL (ref 0.44–1.00)
GFR, Estimated: >60 mL/min (ref >60)
Glucose, Bld: 109 mg/dL — ABNORMAL HIGH (ref 70–99)
Potassium: 4.3 mmol/L (ref 3.5–5.1)
Sodium: 138 mmol/L (ref 135–145)

## 2022-01-16 LAB — URINALYSIS, ROUTINE W REFLEX MICROSCOPIC
Bilirubin Urine: NEGATIVE
Glucose, UA: NEGATIVE mg/dL
Ketones, ur: NEGATIVE mg/dL
Nitrite: NEGATIVE
Protein, ur: NEGATIVE mg/dL
Specific Gravity, Urine: 1.015 (ref 1.005–1.030)
pH: 6 (ref 5.0–8.0)

## 2022-01-16 LAB — POC URINE PREG, ED: Preg Test, Ur: NEGATIVE

## 2022-01-16 LAB — HCG, QUANTITATIVE, PREGNANCY: hCG, Beta Chain, Quant, S: 71 m[IU]/mL — ABNORMAL HIGH (ref <5)

## 2022-01-16 LAB — ABO/RH: ABO/RH(D): A POS

## 2022-01-16 MED ORDER — ONDANSETRON HCL 4 MG/2ML IJ SOLN
4.0000 mg | Freq: Once | INTRAMUSCULAR | Status: AC
  Administered 2022-01-16: 4 mg via INTRAVENOUS
  Filled 2022-01-16: qty 2

## 2022-01-16 NOTE — ED Provider Notes (Signed)
[facility] Provider Note    Event Date/Time   First MD Initiated Contact with Patient 01/16/22 1705     (approximate)   History   Abdominal Cramping   HPI  Tiffany Andrews is a 26 y.o. female who reports that she is approximately [redacted] weeks pregnant and presents with complaints of right lower pelvic discomfort which has worsened over the last day with some mild spotting.  Mild nausea no vomiting.  No fevers or chills.  No dysuria.  History of an appendectomy     Physical Exam   Triage Vital Signs: ED Triage Vitals  Enc Vitals Group     BP 01/16/22 1645 (!) 146/82     Pulse Rate 01/16/22 1645 (!) 106     Resp 01/16/22 1645 19     Temp 01/16/22 1645 98.6 F (37 C)     Temp Source 01/16/22 1645 Oral     SpO2 01/16/22 1645 99 %     Weight 01/16/22 1646 101.6 kg (224 lb)     Height --      Head Circumference --      Peak Flow --      Pain Score --      Pain Loc --      Pain Edu? --      Excl. in GC? --     Most recent vital signs: Vitals:   01/16/22 1645 01/16/22 1945  BP: (!) 146/82 (!) 140/80  Pulse: (!) 106 100  Resp: 19 16  Temp: 98.6 F (37 C) 98 F (36.7 C)  SpO2: 99%      General: Awake, no distress.  CV:  Good peripheral perfusion.  Resp:  Normal effort.  Abd:  No distention.  Tenderness in the right pelvis, no CVA tenderness Other:     ED Results / Procedures / Treatments   Labs (all labs ordered are listed, but only abnormal results are displayed) Labs Reviewed  BASIC METABOLIC PANEL - Abnormal; Notable for the following components:      Result Value   Glucose, Bld 109 (*)    Calcium 8.8 (*)    All other components within normal limits  URINALYSIS, ROUTINE W REFLEX MICROSCOPIC - Abnormal; Notable for the following components:   Color, Urine YELLOW (*)    APPearance HAZY (*)    Hgb urine dipstick SMALL (*)    Leukocytes,Ua SMALL (*)    Bacteria, UA RARE (*)    All other components within normal limits  HCG,  QUANTITATIVE, PREGNANCY - Abnormal; Notable for the following components:   hCG, Beta Chain, Quant, S 71 (*)    All other components within normal limits  CBC  POC URINE PREG, ED  ABO/RH     EKG     RADIOLOGY Ultrasound viewed interpreted by me, no IUP seen    PROCEDURES:  Critical Care performed:   Procedures   MEDICATIONS ORDERED IN ED: Medications  ondansetron (ZOFRAN) injection 4 mg (4 mg Intravenous Given 01/16/22 1816)     IMPRESSION / MDM / ASSESSMENT AND PLAN / ED COURSE  I reviewed the triage vital signs and the nursing notes. Patient's presentation is most consistent with acute presentation with potential threat to life or bodily function.  Patient is approximately [redacted] weeks pregnant presents with pain and spotting as above.  Differential includes threatened miscarriage, ectopic pregnancy, subchorionic hemorrhage.  hCG is only 71, possibility of early pregnancy as well.  Blood work otherwise reassuring, pending ultrasound  Ultrasound does not demonstrate IUP, could be early pregnancy.  Discussed with Dr. Dalbert Garnet, arranged hCG recheck in 48 hours.  Patient is feeling improved, she understands the plan, return precautions discussed        FINAL CLINICAL IMPRESSION(S) / ED DIAGNOSES   Final diagnoses:  Pregnancy of unknown anatomic location     Rx / DC Orders   ED Discharge Orders     None        Note:  This document was prepared using Dragon voice recognition software and may include unintentional dictation errors.   Jene Every, MD 01/17/22 1143

## 2022-01-16 NOTE — ED Triage Notes (Signed)
Pt sts that she is 5 wks preg. Pt sts that she has been spotting and having abd cramps for the last two days. Pt is G2 P1 A0

## 2022-01-20 NOTE — Progress Notes (Signed)
GYNECOLOGY PROGRESS NOTE  Subjective:    Patient ID: Tiffany Andrews, female    DOB: 1995/07/18, 26 y.o.   MRN: [id number]  HPI  Patient is a 26 y.o. G67P1011 female who presents for follow up after miscarriage. Patient's last menstrual period was 12/12/2021 (approximate).  She reports being seen in the Emergency ~ 5 days ago due to complaints of right lower pelvic discomfort. BHCG at that time was 71, no IUP visualized in uterus.  Patient reports that prior to this she had bleeding and noticed a large sac in the toilet.  She followed up at [facility] clinic 2 days later for repeat BHCG which was 28. She has still been having some mild abdominal cramps that comes and goes. Also has some very light bleeding.  Reports that this his her second miscarriage this year. Has a new partner, recently married from previous child, partner age 25.   Patient has question of if her A-fib has anything to do with her miscarriages.    The following portions of the patient's history were reviewed and updated as appropriate: allergies, current medications, past family history, past medical history, past social history, past surgical history, and problem list.  Review of Systems Pertinent items are noted in HPI.   Objective:   Blood pressure 104/69, pulse 79, resp. rate 16, height 5\' 6"  (1.676 m), weight 227 lb 9.6 oz (103.2 kg), last menstrual period 12/12/2021, not currently breastfeeding.  Body mass index is 36.74 kg/m. General appearance: alert and no distress Abdomen: soft, non-tender; bowel sounds normal; no masses,  no organomegaly Pelvic: external genitalia normal, rectovaginal septum normal.  Vagina without discharge, scant light pink blood in vaginal vault.  Cervix normal appearing, no lesions and no motion tenderness.  Uterus mobile, nontender, normal shape and size.  Adnexae non-palpable, nontender bilaterally.   Extremities: extremities normal, atraumatic, no cyanosis or edema Neurologic: Grossly  normal      01/21/2022   10:01 AM  Depression screen PHQ 2/9  Decreased Interest 1  Down, Depressed, Hopeless 1  PHQ - 2 Score 2  Altered sleeping 0  Tired, decreased energy 1  Change in appetite 0  Feeling bad or failure about yourself  0  Trouble concentrating 0  Moving slowly or fidgety/restless 0  Suicidal thoughts 0  PHQ-9 Score 3     Labs:    Latest Reference Range & Units 01/16/22 17:06  HCG, Beta Chain, Quant, S <5 mIU/mL 71 (H)  (H): Data is abnormally high   A POS Performed at [facility], [address] [place], [place]   Imaging:  [id number] OB LESS THAN 14 WEEKS WITH OB TRANSVAGINAL CLINICAL DATA:  Vaginal bleeding. Quantitative beta HCG 71. LMP 12/12/2021  EXAM: OBSTETRIC <14 WK 13/06/2021 AND TRANSVAGINAL OB US  TECHNIQUE: Both transabdominal and transvaginal ultrasound examinations were performed for complete evaluation of the gestation as well as the maternal uterus, adnexal regions, and pelvic cul-de-sac. Transvaginal technique was performed to assess early pregnancy.  COMPARISON:  None Available.  FINDINGS: Intrauterine gestational sac: None identified  Maternal uterus/adnexae: The uterus is anteverted. The cervix is unremarkable. No intrauterine masses are seen. The endometrium is uniform measuring 5 mm in thickness. No free fluid within the cul-de-sac. The maternal ovaries are unremarkable.  IMPRESSION: Pregnancy location not visualized sonographically. Differential diagnosis includes recent spontaneous abortion, IUP too early to visualize, and non-visualized ectopic pregnancy. Recommend close follow up of quantitative B-HCG levels, and follow up US as clinically warranted.  Electronically Signed   By: Helyn Numbers M.D.   On: 01/16/2022 19:19   Assessment:   1. Spontaneous abortion in first trimester   2. History of multiple miscarriages      Plan:   - Spontaneous miscarriage with history of miscarriages now x 2  this year. Patient reports that she thinks she had an additional miscarriage last year but was not assessed. Discussion had with patient regarding recurrence of miscarriage. Discussed continuation of PNV, advised on initiation of baby aspirin 81 mg when considering conception again. Also encouraged to f/u upon discovery of pregnancy and can check progesterone levels.  Advised to wait at least 1-2 regular cycles before attempting conception again.  - To f/u as needed.    A total of 25 minutes were spent during this encounter, including review of previous progress notes, recent imaging and labs, face-to-face with time with patient involving counseling and coordination of care, as well as documentation for current visit.  Hildred Laser, MD [facility]

## 2022-01-21 ENCOUNTER — Ambulatory Visit: Payer: Medicaid Other

## 2022-01-21 ENCOUNTER — Ambulatory Visit (INDEPENDENT_AMBULATORY_CARE_PROVIDER_SITE_OTHER): Payer: Medicaid Other | Admitting: Obstetrics and Gynecology

## 2022-01-21 ENCOUNTER — Encounter: Payer: Self-pay | Admitting: Obstetrics and Gynecology

## 2022-01-21 VITALS — BP 104/69 | HR 79 | Resp 16 | Ht 66.0 in | Wt 227.6 lb

## 2022-01-21 DIAGNOSIS — O039 Complete or unspecified spontaneous abortion without complication: Secondary | ICD-10-CM

## 2022-01-21 DIAGNOSIS — N96 Recurrent pregnancy loss: Secondary | ICD-10-CM | POA: Diagnosis not present

## 2022-03-13 ENCOUNTER — Other Ambulatory Visit: Payer: Self-pay

## 2022-03-13 ENCOUNTER — Emergency Department: Payer: Medicaid Other

## 2022-03-13 ENCOUNTER — Emergency Department
Admission: EM | Admit: 2022-03-13 | Discharge: 2022-03-13 | Disposition: A | Discharge status: Home / Self Care | Payer: Medicaid Other | Attending: Emergency Medicine | Admitting: Emergency Medicine

## 2022-03-13 DIAGNOSIS — R0789 Other chest pain: Secondary | ICD-10-CM | POA: Diagnosis not present

## 2022-03-13 DIAGNOSIS — R55 Syncope and collapse: Secondary | ICD-10-CM | POA: Diagnosis present

## 2022-03-13 LAB — CBC WITH DIFFERENTIAL/PLATELET
Abs Immature Granulocytes: 0.03 10*3/uL (ref 0.00–0.07)
Basophils Absolute: 0.1 10*3/uL (ref 0.0–0.1)
Basophils Relative: 1 %
Eosinophils Absolute: 0.1 10*3/uL (ref 0.0–0.5)
Eosinophils Relative: 2 %
HCT: 39.6 % (ref 36.0–46.0)
Hemoglobin: 12.9 g/dL (ref 12.0–15.0)
Immature Granulocytes: 0 %
Lymphocytes Relative: 30 %
Lymphs Abs: 2.4 10*3/uL (ref 0.7–4.0)
MCH: 29.2 pg (ref 26.0–34.0)
MCHC: 32.6 g/dL (ref 30.0–36.0)
MCV: 89.6 fL (ref 80.0–100.0)
Monocytes Absolute: 0.5 10*3/uL (ref 0.1–1.0)
Monocytes Relative: 6 %
Neutro Abs: 5.1 10*3/uL (ref 1.7–7.7)
Neutrophils Relative %: 61 %
Platelets: 279 10*3/uL (ref 150–400)
RBC: 4.42 MIL/uL (ref 3.87–5.11)
RDW: 12.4 % (ref 11.5–15.5)
WBC: 8.2 10*3/uL (ref 4.0–10.5)
nRBC: 0 % (ref 0.0–0.2)

## 2022-03-13 LAB — MAGNESIUM: Magnesium: 2.4 mg/dL (ref 1.7–2.4)

## 2022-03-13 LAB — BASIC METABOLIC PANEL
Anion gap: 6 (ref 5–15)
BUN: 7 mg/dL (ref 6–20)
CO2: 24 mmol/L (ref 22–32)
Calcium: 8.2 mg/dL — ABNORMAL LOW (ref 8.9–10.3)
Chloride: 109 mmol/L (ref 98–111)
Creatinine, Ser: 0.78 mg/dL (ref 0.44–1.00)
GFR, Estimated: >60 mL/min (ref >60)
Glucose, Bld: 90 mg/dL (ref 70–99)
Potassium: 3.7 mmol/L (ref 3.5–5.1)
Sodium: 139 mmol/L (ref 135–145)

## 2022-03-13 LAB — POC URINE PREG, ED: Preg Test, Ur: NEGATIVE

## 2022-03-13 LAB — TROPONIN I (HIGH SENSITIVITY): Troponin I (High Sensitivity): <2 ng/L (ref <18)

## 2022-03-13 MED ORDER — LACTATED RINGERS IV BOLUS
1000.0000 mL | Freq: Once | INTRAVENOUS | Status: AC
  Administered 2022-03-13: 1000 mL via INTRAVENOUS

## 2022-03-13 MED ORDER — ONDANSETRON HCL 4 MG/2ML IJ SOLN
4.0000 mg | Freq: Once | INTRAMUSCULAR | Status: AC
  Administered 2022-03-13: 4 mg via INTRAVENOUS
  Filled 2022-03-13: qty 2

## 2022-03-13 NOTE — ED Provider Notes (Signed)
[facility] Provider Note    Event Date/Time   First MD Initiated Contact with Patient 03/13/22 1204     (approximate)   History   Chief Complaint Chest Pain and Loss of Consciousness   HPI  Tiffany Andrews is a 27 y.o. female with past medical history of paroxysmal atrial fibrillation, SVT, vasovagal syncope, and migraines who presents to the ED complaining of syncope.  Patient reports that she has been dealing with a migraine headache since last night, denies any fevers, neck stiffness, vision changes, speech changes, numbness, weakness.  She was able to work this morning but developed pressure in the center of her chest about an hour prior to arrival.  She then started to "feel funny" and lightheaded, sat down in a chair, where she proceeded to pass out.  She denies falling out of the chair or hitting her head, remembers being woken up by a coworker.  EMS was called and patient was given 4 baby aspirin as well as 1 sublingual nitro.  She now states that the chest pain has resolved, denies any difficulty breathing.  She has not had any recent fevers or cough and denies any pain or swelling in her legs.  She reports similar episodes of passing out in the past, was previously diagnosed with atrial fibrillation and SVT, but denies other cardiac history.     Physical Exam   Triage Vital Signs: ED Triage Vitals  Enc Vitals Group     BP      Pulse      Resp      Temp      Temp src      SpO2      Weight      Height      Head Circumference      Peak Flow      Pain Score      Pain Loc      Pain Edu?      Excl. in [place]?     Most recent vital signs: Vitals:   03/13/22 1330 03/13/22 1356  BP: 118/77   Pulse: 72 63  Resp: 19 13  Temp:    SpO2: 100% 100%    Constitutional: Alert and oriented. Eyes: Conjunctivae are normal. Head: Atraumatic. Nose: No congestion/rhinnorhea. Mouth/Throat: Mucous membranes are moist.  Cardiovascular: Normal rate, regular  rhythm. Grossly normal heart sounds.  2+ radial pulses bilaterally. Respiratory: Normal respiratory effort.  No retractions. Lungs CTAB. Gastrointestinal: Soft and nontender. No distention. Musculoskeletal: No lower extremity tenderness nor edema.  Neurologic:  Normal speech and language. No gross focal neurologic deficits are appreciated.    ED Results / Procedures / Treatments   Labs (all labs ordered are listed, but only abnormal results are displayed) Labs Reviewed  BASIC METABOLIC PANEL - Abnormal; Notable for the following components:      Result Value   Calcium 8.2 (*)    All other components within normal limits  CBC WITH DIFFERENTIAL/PLATELET  MAGNESIUM  POC URINE PREG, ED  TROPONIN I (HIGH SENSITIVITY)     EKG  ED ECG REPORT I, Blake Divine, the attending physician, personally viewed and interpreted this ECG.   Date: 03/13/2022  EKG Time: 12:11  Rate: 63  Rhythm: normal sinus rhythm  Axis: Normal  Intervals:none  ST&T Change: None  RADIOLOGY Chest x-ray reviewed and interpreted by me with no infiltrate, edema, or effusion.  PROCEDURES:  Critical Care performed: No  .1-3 Lead EKG Interpretation  Performed  by: Blake Divine, MD Authorized by: Blake Divine, MD     Interpretation: normal     ECG rate:  60-75   ECG rate assessment: normal     Rhythm: sinus rhythm     Ectopy: none     Conduction: normal      MEDICATIONS ORDERED IN ED: Medications  lactated ringers bolus 1,000 mL (0 mLs Intravenous Stopped 03/13/22 1316)  ondansetron (ZOFRAN) injection 4 mg (4 mg Intravenous Given 03/13/22 1357)     IMPRESSION / MDM / ASSESSMENT AND PLAN / ED COURSE  I reviewed the triage vital signs and the nursing notes.                              27 y.o. female with past medical history of paroxysmal atrial fibrillation, SVT, syncope, and migraines who presents to the ED complaining of chest pain starting about an hour prior to arrival followed by a  syncopal episode, with chest pain since resolving.  Patient's presentation is most consistent with acute presentation with potential threat to life or bodily function.  Differential diagnosis includes, but is not limited to, ACS, arrhythmia, PE, vasovagal episode, stress-induced syncope, orthostatic hypotension, anemia, electrolyte abnormality.  Patient well-appearing and in no acute distress, EKG shows no evidence of arrhythmia or ischemia, vital signs are reassuring.  Reviewing her chart, patient has a history of brief atrial fibrillation along with SVT, has not been anticoagulated due to low CHA2DS2-VASc score.  Will observe on cardiac monitor, check electrolytes and cardiac enzymes, but low suspicion for ACS at this time given atypical symptoms with lack of risk factors.  I also doubt PE as patient is PERC negative, chest pain resolving.  Pregnancy testing and chest x-ray are pending, will reassess following results.  The patient is on the cardiac monitor to evaluate for evidence of arrhythmia and/or significant heart rate changes.  Pregnancy testing is negative and no events noted on cardiac monitor.  Patient remains asymptomatic here in the ED with reassuring labs with no anemia, leukocytosis, electrolyte abnormality, or AKI.  Troponin within normal limits and I doubt ACS or PE.  Patient is appropriate for discharge home with follow-up with her cardiologist, was counseled to return to the ED for new or worsening symptoms.  Patient agrees with plan.      FINAL CLINICAL IMPRESSION(S) / ED DIAGNOSES   Final diagnoses:  Syncope, unspecified syncope type  Atypical chest pain     Rx / DC Orders   ED Discharge Orders     None        Note:  This document was prepared using Dragon voice recognition software and may include unintentional dictation errors.   Blake Divine, MD 03/13/22 1400

## 2022-03-13 NOTE — ED Triage Notes (Signed)
Patient came in for chest pain and two syncopal episodes that occurred at approximately 1000 this morning while at work. Pt stated has had a migraine since 1930 last night. Pt has an extensive cardiac history EMS stated vfib/vtach and patient states from cardiac doctor afib/tachy/SVT. EMS gave patient 4 baby asa and 1 sublingual nitro spray. Pt has 20G in R AC received 481mL of NS. Pt is due to see new cardiologist at [facility] with upcoming appointment.

## 2022-03-13 NOTE — ED Notes (Signed)
Patient ambulatory with steady gait to bathroom and back.

## 2022-03-13 NOTE — ED Notes (Signed)
Patient having sudden wave of nausea.

## 2022-07-15 ENCOUNTER — Emergency Department: Payer: Medicaid Other

## 2022-07-15 ENCOUNTER — Other Ambulatory Visit: Payer: Self-pay

## 2022-07-15 ENCOUNTER — Encounter: Payer: Self-pay | Admitting: Emergency Medicine

## 2022-07-15 ENCOUNTER — Emergency Department
Admission: EM | Admit: 2022-07-15 | Discharge: 2022-07-16 | Disposition: A | Discharge status: Home / Self Care | Payer: Medicaid Other | Attending: Emergency Medicine | Admitting: Emergency Medicine

## 2022-07-15 DIAGNOSIS — R300 Dysuria: Secondary | ICD-10-CM

## 2022-07-15 DIAGNOSIS — I480 Paroxysmal atrial fibrillation: Secondary | ICD-10-CM | POA: Diagnosis not present

## 2022-07-15 DIAGNOSIS — R7309 Other abnormal glucose: Secondary | ICD-10-CM | POA: Diagnosis not present

## 2022-07-15 DIAGNOSIS — N3000 Acute cystitis without hematuria: Secondary | ICD-10-CM

## 2022-07-15 DIAGNOSIS — R002 Palpitations: Secondary | ICD-10-CM | POA: Diagnosis present

## 2022-07-15 LAB — T4, FREE: Free T4: 0.92 ng/dL (ref 0.61–1.12)

## 2022-07-15 LAB — URINALYSIS, ROUTINE W REFLEX MICROSCOPIC
Bilirubin Urine: NEGATIVE
Glucose, UA: 50 mg/dL — AB
Hgb urine dipstick: NEGATIVE
Ketones, ur: NEGATIVE mg/dL
Leukocytes,Ua: NEGATIVE
Nitrite: POSITIVE — AB
Protein, ur: 100 mg/dL — AB
Specific Gravity, Urine: 1.024 (ref 1.005–1.030)
pH: 5 (ref 5.0–8.0)

## 2022-07-15 LAB — CBC
HCT: 41.4 % (ref 36.0–46.0)
Hemoglobin: 13.5 g/dL (ref 12.0–15.0)
MCH: 29.3 pg (ref 26.0–34.0)
MCHC: 32.6 g/dL (ref 30.0–36.0)
MCV: 89.8 fL (ref 80.0–100.0)
Platelets: 277 10*3/uL (ref 150–400)
RBC: 4.61 MIL/uL (ref 3.87–5.11)
RDW: 12.7 % (ref 11.5–15.5)
WBC: 10.9 10*3/uL — ABNORMAL HIGH (ref 4.0–10.5)
nRBC: 0 % (ref 0.0–0.2)

## 2022-07-15 LAB — URINE DRUG SCREEN, QUALITATIVE (ARMC ONLY)
Amphetamines, Ur Screen: NOT DETECTED
Barbiturates, Ur Screen: NOT DETECTED
Benzodiazepine, Ur Scrn: NOT DETECTED
Cannabinoid 50 Ng, Ur ~~LOC~~: POSITIVE — AB
Cocaine Metabolite,Ur ~~LOC~~: NOT DETECTED
MDMA (Ecstasy)Ur Screen: NOT DETECTED
Methadone Scn, Ur: NOT DETECTED
Opiate, Ur Screen: NOT DETECTED
Phencyclidine (PCP) Ur S: NOT DETECTED
Tricyclic, Ur Screen: NOT DETECTED

## 2022-07-15 LAB — BASIC METABOLIC PANEL
Anion gap: 9 (ref 5–15)
BUN: 9 mg/dL (ref 6–20)
CO2: 24 mmol/L (ref 22–32)
Calcium: 8.7 mg/dL — ABNORMAL LOW (ref 8.9–10.3)
Chloride: 105 mmol/L (ref 98–111)
Creatinine, Ser: 0.82 mg/dL (ref 0.44–1.00)
GFR, Estimated: >60 mL/min (ref >60)
Glucose, Bld: 92 mg/dL (ref 70–99)
Potassium: 3.8 mmol/L (ref 3.5–5.1)
Sodium: 138 mmol/L (ref 135–145)

## 2022-07-15 LAB — TSH: TSH: 0.864 u[IU]/mL (ref 0.350–4.500)

## 2022-07-15 LAB — TROPONIN I (HIGH SENSITIVITY): Troponin I (High Sensitivity): 3 ng/L (ref <18)

## 2022-07-15 LAB — CBG MONITORING, ED: Glucose-Capillary: 72 mg/dL (ref 70–99)

## 2022-07-15 LAB — POC URINE PREG, ED: Preg Test, Ur: NEGATIVE

## 2022-07-15 MED ORDER — CEPHALEXIN 500 MG PO CAPS
500.0000 mg | ORAL_CAPSULE | Freq: Once | ORAL | Status: AC
  Administered 2022-07-15: 500 mg via ORAL
  Filled 2022-07-15: qty 1

## 2022-07-15 NOTE — ED Triage Notes (Addendum)
Pt reports she was diagnosed 1 year ago for AFib and SVT and currently has halter monitor in place on chest due to new work up by Cardiology for possible POTS. Today pt started to experience palpitations, dizziness and SOB at 1830. 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 Pt arrived to ED by EMS with continued symptoms.   **Pt also reports she began to have dysuria and urinary frequency x2 days ago and is currently taking OTZ OTC pills.

## 2022-07-15 NOTE — ED Provider Notes (Signed)
[facility] Provider Note    Event Date/Time   First MD Initiated Contact with Patient 07/15/22 2335     (approximate)   History   Palpitations   HPI  Tiffany Andrews is a 27 y.o. female who presents to the ED for evaluation of Palpitations   I review of 5/31 [facility] cardiology visit.  History of SVT and paroxysmal A-fib.  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  Plan to repeat another 30-day Holter monitor.  CHA2DS2-VASc of 1 and no AC.  Patient presents to the ED for evaluation of intermittent episodes of palpitations in the past few days associated with dysuria.  No flank pain or fever or emesis or syncope.  No chest pain or dyspnea.   Physical Exam   Triage Vital Signs: ED Triage Vitals  Enc Vitals Group     BP 07/15/22 1943 124/87     Pulse Rate 07/15/22 1943 82     Resp 07/15/22 1943 17     Temp 07/15/22 1943 98 F (36.7 C)     Temp Source 07/15/22 1943 Oral     SpO2 07/15/22 1943 98 %     Weight 07/15/22 1944 217 lb (98.4 kg)     Height 07/15/22 1944 5\' 6"  (1.676 m)     Head Circumference --      Peak Flow --      Pain Score 07/15/22 1944 3     Pain Loc --      Pain Edu? --      Excl. in GC? --     Most recent vital signs: Vitals:   07/15/22 1943 07/16/22 0209  BP: 124/87 121/83  Pulse: 82 77  Resp: 17 16  Temp: 98 F (36.7 C) 98.3 F (36.8 C)  SpO2: 98% 99%    General: Awake, no distress.  Pleasant and conversational CV:  Good peripheral perfusion.  RRR Resp:  Normal effort.  Abd:  No distention.  Mild suprapubic tenderness.  No peritoneal features.  No CVA tenderness. MSK:  No deformity noted.  Neuro:  No focal deficits appreciated. Other:     ED Results / Procedures / Treatments   Labs (all labs ordered are listed, but only abnormal results are displayed) Labs Reviewed  BASIC METABOLIC PANEL - Abnormal; Notable for the following components:      Result Value   Calcium 8.7  (*)    All other components within normal limits  CBC - Abnormal; Notable for the following components:   WBC 10.9 (*)    All other components within normal limits  URINALYSIS, ROUTINE W REFLEX MICROSCOPIC - Abnormal; Notable for the following components:   Color, Urine AMBER (*)    APPearance CLEAR (*)    Glucose, UA 50 (*)    Protein, ur 100 (*)    Nitrite POSITIVE (*)    Bacteria, UA RARE (*)    All other components within normal limits  URINE DRUG SCREEN, QUALITATIVE ([facility] ONLY) - Abnormal; Notable for the following components:   Cannabinoid 50 Ng, Ur [place] POSITIVE (*)    All other components within normal limits  URINE CULTURE  TSH  T4, FREE  POC URINE PREG, ED  CBG MONITORING, ED  TROPONIN I (HIGH SENSITIVITY)  TROPONIN I (HIGH SENSITIVITY)    EKG Sinus rhythm with rate 71 bpm.  Normal axis and intervals.  No clear signs of acute ischemia.  RADIOLOGY CXR interpreted by me without  evidence of acute cardiopulmonary pathology.  Official radiology report(s): DG Chest 2 View  Result Date: 07/15/2022 CLINICAL DATA:  Shortness of breath, palpitations EXAM: CHEST - 2 VIEW COMPARISON:  03/13/2022 FINDINGS: The heart size and mediastinal contours are within normal limits. Both lungs are clear. The visualized skeletal structures are unremarkable. IMPRESSION: No acute abnormality of the lungs. Electronically Signed   By: Jearld Lesch M.D.   On: 07/15/2022 20:13    PROCEDURES and INTERVENTIONS:  .1-3 Lead EKG Interpretation  Performed by: Delton Prairie, MD Authorized by: Delton Prairie, MD     Interpretation: normal     ECG rate:  80   ECG rate assessment: normal     Rhythm: sinus rhythm     Ectopy: none     Conduction: normal     Medications  cephALEXin (KEFLEX) capsule 500 mg (500 mg Oral Given 07/15/22 2359)  lactated ringers bolus 1,000 mL (0 mLs Intravenous Stopped 07/16/22 0209)     IMPRESSION / MDM / ASSESSMENT AND PLAN / ED COURSE  I reviewed the triage vital signs  and the nursing notes.  Differential diagnosis includes, but is not limited to, UTI, sepsis, pyelonephritis, SVT, A-fib with RVR  {Patient presents with symptoms of an acute illness or injury that is potentially life-threatening.  Patient presents with evidence of acute cystitis suitable for outpatient management.  Has had some palpitations but no evidence of dysrhythmias on the monitor or on multiple twelve-lead EKGs while she is here.  Troponins are negative and so is her potassium.  Thyroid studies are reassuring.  Urine with infectious features and she is started on Keflex.  She already has long-term monitor on her chest for outpatient monitoring via cardiology.  While I considered observation admission for this patient, I think she is reasonably suitable for outpatient management.      FINAL CLINICAL IMPRESSION(S) / ED DIAGNOSES   Final diagnoses:  Palpitations  Dysuria  Acute cystitis without hematuria     Rx / DC Orders   ED Discharge Orders          Ordered    cephALEXin (KEFLEX) 500 MG capsule  3 times daily        07/16/22 0103             Note:  This document was prepared using Dragon voice recognition software and may include unintentional dictation errors.   Delton Prairie, MD 07/16/22 [phone]

## 2022-07-15 NOTE — ED Notes (Addendum)
First Nurse Note: Pt arrives via [facility] from work with complaints of a near syncopal episode. Pt its currently on a halter monitor, A&Ox4.   VSS with EMS 20G RFA [id number]

## 2022-07-16 LAB — TROPONIN I (HIGH SENSITIVITY): Troponin I (High Sensitivity): 2 ng/L (ref <18)

## 2022-07-16 LAB — URINE CULTURE

## 2022-07-16 MED ORDER — CEPHALEXIN 500 MG PO CAPS: 500.0000 mg | ORAL_CAPSULE | Freq: Three times a day (TID) | ORAL | 0 refills | Status: AC

## 2022-07-16 MED ORDER — LACTATED RINGERS IV BOLUS
1000.0000 mL | Freq: Once | INTRAVENOUS | Status: AC
  Administered 2022-07-16: 1000 mL via INTRAVENOUS

## 2022-07-16 NOTE — Discharge Instructions (Signed)
The Keflex antibiotic 3 times daily for the next 5 days to treat UTI.  Continue your other medications and follow-up with your cardiologist regarding the Holter monitor.   If you have any sustained episodes of dizziness with palpitations, chest pain or passing out and please return to the ED

## 2022-07-18 LAB — URINE CULTURE: Culture: 80000 — AB

## 2022-08-18 NOTE — Progress Notes (Signed)
    NURSE VISIT NOTE  Subjective:    Patient ID: Tiffany Andrews, female    DOB: 10/08/1995, 27 y.o.   MRN: [id number]  HPI  Patient is a 27 y.o. G32P1021 female who presents for evaluation of amenorrhea. She believes she could be pregnant. Pregnancy is desired. Sexual Activity: single partner, contraception: none. Current symptoms also include: breast tenderness, fatigue, frequent urination, nausea, and 2 positive home pregnancy test. Last period was abnormal. She is unsure of her last menstrual period. There was a faint line on urine hcg and  she has a history of miscarriage, so I ordered a beta quant.    Objective:    BP 101/60   Pulse 84   Resp 16   Ht 5\' 6"  (1.676 m)   Wt 221 lb 1.6 oz (100.3 kg)   LMP  (LMP Unknown)   BMI 35.69 kg/m   Lab Review  Results for orders placed or performed in visit on 08/19/22  POCT urine pregnancy  Result Value Ref Range   Preg Test, Ur Positive (A) Negative    Assessment:   1. Amenorrhea     Plan:   Pregnancy Test: Positive  Estimated Date of Delivery: LMP unknown unable to determine EDD, ultrasound scheduled Encouraged well-balanced diet, plenty of rest when needed, pre-natal vitamins daily and walking for exercise.  Discussed self-help for nausea, avoiding OTC medications until consulting provider or pharmacist, other than Tylenol as needed, minimal caffeine (1-2 cups daily) and avoiding alcohol.   She will schedule her nurse visit @ [redacted] wks pregnant, u/s for dating and labs @10  wk, and NOB visit at [redacted] wk pregnant.    Feel free to call with any questions.       Santiago Bumpers, CMA [name]

## 2022-08-18 NOTE — Patient Instructions (Signed)
Morning Sickness  Morning sickness is when you feel like you may vomit (feel nauseous) during pregnancy. Sometimes, you may vomit. Morning sickness most often happens in the morning, but it can also happen at any time of the day. Some women may have morning sickness that makes them vomit all the time. This is a more serious problem that needs treatment. What are the causes? The cause of this condition is not known. What increases the risk? You had vomiting or a feeling like you may vomit before your pregnancy. You had morning sickness in another pregnancy. You are pregnant with more than one baby, such as twins. What are the signs or symptoms? Feeling like you may vomit. Vomiting. How is this treated? Treatment is usually not needed for this condition. You may only need to change what you eat. In some cases, your doctor may give you some things to take for your condition. These include: Vitamin B6 supplements. Medicines to treat the feeling that you may vomit. Ginger. Follow these instructions at home: Medicines Take over-the-counter and prescription medicines only as told by your doctor. Do not take any medicines until you talk with your doctor about them first. Take multivitamins before you get pregnant. These can stop or lessen the symptoms of morning sickness. Eating and drinking Eat dry toast or crackers before getting out of bed. Eat 5 or 6 small meals a day. Eat dry and bland foods like rice and baked potatoes. Do not eat greasy, fatty, or spicy foods. Have someone Adriano for you if the smell of food causes you to vomit or to feel like you may vomit. If you feel like you may vomit after taking prenatal vitamins, take them at night or with a snack. Eat protein foods when you need a snack. Nuts, yogurt, and cheese are good choices. Drink fluids throughout the day. Try ginger ale made with real ginger, ginger tea made from fresh grated ginger, or ginger candies. General  instructions Do not smoke or use any products that contain nicotine or tobacco. If you need help quitting, ask your doctor. Use an air purifier to keep the air in your house free of smells. Get lots of fresh air. Try to avoid smells that make you feel sick. Try wearing an acupressure wristband. This is a wristband that is used to treat seasickness. Try a treatment called acupuncture. In this treatment, a doctor puts needles into certain areas of your body to make you feel better. Contact a doctor if: You need medicine to feel better. You feel dizzy or light-headed. You are losing weight. Get help right away if: The feeling that you may vomit will not go away, or you cannot stop vomiting. You faint. You have very bad pain in your belly. Summary Morning sickness is when you feel like you may vomit (feel nauseous) during pregnancy. You may feel sick in the morning, but you can feel this way at any time of the day. Making some changes to what you eat may help your symptoms go away. This information is not intended to replace advice given to you by your health care provider. Make sure you discuss any questions you have with your health care provider. Document Revised: 09/09/2019 Document Reviewed: 08/19/2019 Elsevier Patient Education  2024 [employer]. First Trimester of Pregnancy  The first trimester of pregnancy starts on the first day of your last menstrual period until the end of week 12. This is also called months 1 through 3 of pregnancy. Body changes   during your first trimester Your body goes through many changes during pregnancy. The changes usually return to normal after your baby is born. Physical changes You may gain or lose weight. Your breasts may grow larger and hurt. The area around your nipples may get darker. Dark spots or blotches may develop on your face. You may have changes in your hair. Health changes You may feel like you might vomit (nauseous), and you may  vomit. You may have heartburn. You may have headaches. You may have trouble pooping (constipation). Your gums may bleed. Other changes You may get tired easily. You may pee (urinate) more often. Your menstrual periods will stop. You may not feel hungry. You may want to eat certain kinds of food. You may have changes in your emotions from day to day. You may have more dreams. Follow these instructions at home: Medicines Take over-the-counter and prescription medicines only as told by your doctor. Some medicines are not safe during pregnancy. Take a prenatal vitamin that contains at least 600 micrograms (mcg) of folic acid. Eating and drinking Eat healthy meals that include: Fresh fruits and vegetables. Whole grains. Good sources of protein, such as meat, eggs, or tofu. Low-fat dairy products. Avoid raw meat and unpasteurized juice, milk, and cheese. If you feel like you may vomit, or you vomit: Eat 4 or 5 small meals a day instead of 3 large meals. Try eating a few soda crackers. Drink liquids between meals instead of during meals. You may need to take these actions to prevent or treat trouble pooping: Drink enough fluids to keep your pee (urine) pale yellow. Eat foods that are high in fiber. These include beans, whole grains, and fresh fruits and vegetables. Limit foods that are high in fat and sugar. These include fried or sweet foods. Activity Exercise only as told by your doctor. Most people can do their usual exercise routine during pregnancy. Stop exercising if you have cramps or pain in your lower belly (abdomen) or low back. Do not exercise if it is too hot or too humid, or if you are in a place of great height (high altitude). Avoid heavy lifting. If you choose to, you may have sex unless your doctor tells you not to. Relieving pain and discomfort Wear a good support bra if your breasts are sore. Rest with your legs raised (elevated) if you have leg cramps or low back  pain. If you have bulging veins (varicose veins) in your legs: Wear support hose as told by your doctor. Raise your feet for 15 minutes, 3-4 times a day. Limit salt in your food. Safety Wear your seat belt at all times when you are in a car. Talk with your doctor if someone is hurting you or yelling at you. Talk with your doctor if you are feeling sad or have thoughts of hurting yourself. Lifestyle Do not use hot tubs, steam rooms, or saunas. Do not douche. Do not use tampons or scented sanitary pads. Do not use herbal medicines, illegal drugs, or medicines that are not approved by your doctor. Do not drink alcohol. Do not smoke or use any products that contain nicotine or tobacco. If you need help quitting, ask your doctor. Avoid cat litter boxes and soil that is used by cats. These carry germs that can cause harm to the baby and can cause a loss of your baby by miscarriage or stillbirth. General instructions Keep all follow-up visits. This is important. Ask for help if you need counseling or   if you need help with nutrition. Your doctor can give you advice or tell you where to go for help. Visit your dentist. At home, brush your teeth with a soft toothbrush. Floss gently. Write down your questions. Take them to your prenatal visits. Where to find more information American Pregnancy Association: americanpregnancy.org American College of Obstetricians and Gynecologists: www.acog.org Office on Women's Health: womenshealth.gov/pregnancy Contact a doctor if: You are dizzy. You have a fever. You have mild cramps or pressure in your lower belly. You have a nagging pain in your belly area. You continue to feel like you may vomit, you vomit, or you have watery poop (diarrhea) for 24 hours or longer. You have a bad-smelling fluid coming from your vagina. You have pain when you pee. You are exposed to a disease that spreads from person to person, such as chickenpox, measles, Zika virus, HIV, or  hepatitis. Get help right away if: You have spotting or bleeding from your vagina. You have very bad belly cramping or pain. You have shortness of breath or chest pain. You have any kind of injury, such as from a fall or a car crash. You have new or increased pain, swelling, or redness in an arm or leg. Summary The first trimester of pregnancy starts on the first day of your last menstrual period until the end of week 12 (months 1 through 3). Eat 4 or 5 small meals a day instead of 3 large meals. Do not smoke or use any products that contain nicotine or tobacco. If you need help quitting, ask your doctor. Keep all follow-up visits. This information is not intended to replace advice given to you by your health care provider. Make sure you discuss any questions you have with your health care provider. Document Revised: 07/03/2019 Document Reviewed: 05/09/2019 Elsevier Patient Education  2024 Elsevier Inc. Commonly Asked Questions During Pregnancy  Cats: A parasite can be excreted in cat feces.  To avoid exposure you need to have another person empty the little box.  If you must empty the litter box you will need to wear gloves.  Wash your hands after handling your cat.  This parasite can also be found in raw or undercooked meat so this should also be avoided.  Colds, Sore Throats, Flu: Please check your medication sheet to see what you can take for symptoms.  If your symptoms are unrelieved by these medications please call the office.  Dental Work: Most any dental work your dentist recommends is permitted.  X-rays should only be taken during the first trimester if absolutely necessary.  Your abdomen should be shielded with a lead apron during all x-rays.  Please notify your provider prior to receiving any x-rays.  Novocaine is fine; gas is not recommended.  If your dentist requires a note from us prior to dental work please call the office and we will provide one for you.  Exercise: Exercise is  an important part of staying healthy during your pregnancy.  You may continue most exercises you were accustomed to prior to pregnancy.  Later in your pregnancy you will most likely notice you have difficulty with activities requiring balance like riding a bicycle.  It is important that you listen to your body and avoid activities that put you at a higher risk of falling.  Adequate rest and staying well hydrated are a must!  If you have questions about the safety of specific activities ask your provider.    Exposure to Children with illness: Try to   avoid obvious exposure; report any symptoms to us when noted,  If you have chicken pos, red measles or mumps, you should be immune to these diseases.   Please do not take any vaccines while pregnant unless you have checked with your OB provider.  Fetal Movement: After 28 weeks we recommend you do "kick counts" twice daily.  Lie or sit down in a calm quiet environment and count your baby movements "kicks".  You should feel your baby at least 10 times per hour.  If you have not felt 10 kicks within the first hour get up, walk around and have something sweet to eat or drink then repeat for an additional hour.  If count remains less than 10 per hour notify your provider.  Fumigating: Follow your pest control agent's advice as to how long to stay out of your home.  Ventilate the area well before re-entering.  Hemorrhoids:   Most over-the-counter preparations can be used during pregnancy.  Check your medication to see what is safe to use.  It is important to use a stool softener or fiber in your diet and to drink lots of liquids.  If hemorrhoids seem to be getting worse please call the office.   Hot Tubs:  Hot tubs Jacuzzis and saunas are not recommended while pregnant.  These increase your internal body temperature and should be avoided.  Intercourse:  Sexual intercourse is safe during pregnancy as long as you are comfortable, unless otherwise advised by your  provider.  Spotting may occur after intercourse; report any bright red bleeding that is heavier than spotting.  Labor:  If you know that you are in labor, please go to the hospital.  If you are unsure, please call the office and let us help you decide what to do.  Lifting, straining, etc:  If your job requires heavy lifting or straining please check with your provider for any limitations.  Generally, you should not lift items heavier than that you can lift simply with your hands and arms (no back muscles)  Painting:  Paint fumes do not harm your pregnancy, but may make you ill and should be avoided if possible.  Latex or water based paints have less odor than oils.  Use adequate ventilation while painting.  Permanents & Hair Color:  Chemicals in hair dyes are not recommended as they cause increase hair dryness which can increase hair loss during pregnancy.  " Highlighting" and permanents are allowed.  Dye may be absorbed differently and permanents may not hold as well during pregnancy.  Sunbathing:  Use a sunscreen, as skin burns easily during pregnancy.  Drink plenty of fluids; avoid over heating.  Tanning Beds:  Because their possible side effects are still unknown, tanning beds are not recommended.  Ultrasound Scans:  Routine ultrasounds are performed at approximately 20 weeks.  You will be able to see your baby's general anatomy an if you would like to know the gender this can usually be determined as well.  If it is questionable when you conceived you may also receive an ultrasound early in your pregnancy for dating purposes.  Otherwise ultrasound exams are not routinely performed unless there is a medical necessity.  Although you can request a scan we ask that you pay for it when conducted because insurance does not cover " patient request" scans.  Work: If your pregnancy proceeds without complications you may work until your due date, unless your physician or employer advises  otherwise.  Round Ligament Pain/Pelvic Discomfort:    Sharp, shooting pains not associated with bleeding are fairly common, usually occurring in the second trimester of pregnancy.  They tend to be worse when standing up or when you remain standing for long periods of time.  These are the result of pressure of certain pelvic ligaments called "round ligaments".  Rest, Tylenol and heat seem to be the most effective relief.  As the womb and fetus grow, they rise out of the pelvis and the discomfort improves.  Please notify the office if your pain seems different than that described.  It may represent a more serious condition.  Common Medications Safe in Pregnancy  Acne:      Constipation:  Benzoyl Peroxide     Colace  Clindamycin      Dulcolax Suppository  Topica Erythromycin     Fibercon  Salicylic Acid      Metamucil         Miralax AVOID:        Senakot   Accutane    Cough:  Retin-A       Cough Drops  Tetracycline      Phenergan w/ Codeine if Rx  Minocycline      Robitussin (Plain & DM)  Antibiotics:     Crabs/Lice:  Ceclor       RID  Cephalosporins    AVOID:  E-Mycins      Kwell  Keflex  Macrobid/Macrodantin   Diarrhea:  Penicillin      Kao-Pectate  Zithromax      Imodium AD         PUSH FLUIDS AVOID:       Cipro     Fever:  Tetracycline      Tylenol (Regular or Extra  Minocycline       Strength)  Levaquin      Extra Strength-Do not          Exceed 8 tabs/24 hrs Caffeine:        <200mg/day (equiv. To 1 cup of coffee or  approx. 3 12 oz sodas)         Gas: Cold/Hayfever:       Gas-X  Benadryl      Mylicon  Claritin       Phazyme  **Claritin-D        Chlor-Trimeton    Headaches:  Dimetapp      ASA-Free Excedrin  Drixoral-Non-Drowsy     Cold Compress  Mucinex (Guaifenasin)     Tylenol (Regular or Extra  Sudafed/Sudafed-12 Hour     Strength)  **Sudafed PE Pseudoephedrine   Tylenol Cold & Sinus     Vicks Vapor Rub  Zyrtec  **AVOID if Problems With Blood  Pressure         Heartburn: Avoid lying down for at least 1 hour after meals  Aciphex      Maalox     Rash:  Milk of Magnesia     Benadryl    Mylanta       1% Hydrocortisone Cream  Pepcid  Pepcid Complete   Sleep Aids:  Prevacid      Ambien   Prilosec       Benadryl  Rolaids       Chamomile Tea  Tums (Limit 4/day)     Unisom         Tylenol PM         Warm milk-add vanilla or  Hemorrhoids:       Sugar for taste  Anusol/Anusol H.C.  (RX: Analapram 2.5%)    Sugar Substitutes:  Hydrocortisone OTC     Ok in moderation  Preparation H      Tucks        Vaseline lotion applied to tissue with wiping    Herpes:     Throat:  Acyclovir      Oragel  Famvir  Valtrex     Vaccines:         Flu Shot Leg Cramps:       *Gardasil  Benadryl      Hepatitis A         Hepatitis B Nasal Spray:       Pneumovax  Saline Nasal Spray     Polio Booster         Tetanus Nausea:       Tuberculosis test or PPD  Vitamin B6 25 mg TID   AVOID:    Dramamine      *Gardasil  Emetrol       Live Poliovirus  Ginger Root 250 mg QID    MMR (measles, mumps &  High Complex Carbs @ Bedtime    rebella)  Sea Bands-Accupressure    Varicella (Chickenpox)  Unisom 1/2 tab TID     *No known complications           If received before Pain:         Known pregnancy;   Darvocet       Resume series after  Lortab        Delivery  Percocet    Yeast:   Tramadol      Femstat  Tylenol 3      Gyne-lotrimin  Ultram       Monistat  Vicodin           MISC:         All Sunscreens           Hair Coloring/highlights          Insect Repellant's          (Including DEET)         Mystic Tans  

## 2022-08-19 ENCOUNTER — Ambulatory Visit (INDEPENDENT_AMBULATORY_CARE_PROVIDER_SITE_OTHER): Payer: Medicaid Other

## 2022-08-19 VITALS — BP 101/60 | HR 84 | Resp 16 | Ht 66.0 in | Wt 221.1 lb

## 2022-08-19 DIAGNOSIS — Z3201 Encounter for pregnancy test, result positive: Secondary | ICD-10-CM | POA: Diagnosis not present

## 2022-08-19 DIAGNOSIS — Z789 Other specified health status: Secondary | ICD-10-CM

## 2022-08-19 DIAGNOSIS — N912 Amenorrhea, unspecified: Secondary | ICD-10-CM

## 2022-08-19 LAB — POCT URINE PREGNANCY: Preg Test, Ur: POSITIVE — AB

## 2022-08-19 MED ORDER — ONDANSETRON 4 MG PO TBDP: 4.0000 mg | ORAL_TABLET | Freq: Three times a day (TID) | ORAL | 0 refills | Status: DC | PRN

## 2022-08-20 LAB — BETA HCG QUANT (REF LAB): hCG Quant: 51 m[IU]/mL

## 2022-08-22 ENCOUNTER — Encounter: Payer: Self-pay | Admitting: Obstetrics and Gynecology

## 2022-08-22 ENCOUNTER — Ambulatory Visit (INDEPENDENT_AMBULATORY_CARE_PROVIDER_SITE_OTHER): Payer: Medicaid Other

## 2022-08-22 DIAGNOSIS — Z3480 Encounter for supervision of other normal pregnancy, unspecified trimester: Secondary | ICD-10-CM | POA: Insufficient documentation

## 2022-08-22 DIAGNOSIS — N912 Amenorrhea, unspecified: Secondary | ICD-10-CM

## 2022-08-22 NOTE — Telephone Encounter (Signed)
Patient will follow up tomorrow for repeat BHCG. Please place order.

## 2022-08-22 NOTE — Progress Notes (Signed)
New OB Intake  I connected with  Tiffany Andrews on 08/22/22 at  2:15 PM EDT by telephone and verified that I am speaking with the correct person using two identifiers. Nurse is located at [facility] and pt is located at friend's home.  I explained I am completing New OB Intake today. We discussed her EDD of 04/12/2023 that is based on LMP of 07/06/2022. Pt is G4/P1021. I reviewed her allergies, medications, Medical/Surgical/OB history, and appropriate screenings. There are cats in the home.  If yes Indoor.  Pt does not change the litter box. Based on history, this is a/an pregnancy uncomplicated .   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  Pt is anxious about having another miscarriage.  Wants [facility] to deliver her.  Pt wanted to know if it was in her chart that this was an high risk ob; adv it wasn't.  Delivery Plans:  Plans to deliver at [facility].  Anatomy US Explained first scheduled [place] will be Aug. 5th and an anatomy scan will be done at 20 weeks.  Labs Discussed genetic screening with patient. Patient desires genetic testing to be drawn at new OB visit. Discussed possible labs to be drawn at new OB appointment.  COVID Vaccine Patient has not had COVID vaccine.   Social Determinants of Health Food Insecurity: expresses food insecurity. Information  given on local food banks. Transportation: Patient denies transportation needs. Childcare: Discussed no children allowed at ultrasound appointments.   First visit review I reviewed new OB appt with pt. I explained she will have ob bloodwork and pap smear/pelvic exam if indicated. Explained pt will be seen by an AOB provider at first visit; encounter routed to appropriate provider.   Loran Senters, [facility] 08/22/2022  2:53 PM

## 2022-08-22 NOTE — Telephone Encounter (Signed)
 Order placed

## 2022-08-22 NOTE — Patient Instructions (Signed)
[facility] First Trimester of Pregnancy  The first trimester of pregnancy starts on the first day of your last menstrual period until the end of week 12. This is also called months 1 through 3 of pregnancy. Body changes during your first trimester Your body goes through many changes during pregnancy. The changes usually return to normal after your baby is born. Physical changes You may gain or lose weight. Your breasts may grow larger and hurt. The area around your nipples may get darker. Dark spots or blotches may develop on your face. You may have changes in your hair. Health changes You may feel like you might vomit (nauseous), and you may vomit. You may have heartburn. You may have headaches. You may have trouble pooping (constipation). Your gums may bleed. Other changes You may get tired easily. You may pee (urinate) more often. Your menstrual periods will stop. You may not feel hungry. You may want to eat certain kinds of food. You may have changes in your emotions from day to day. You may have more dreams. Follow these instructions at home: Medicines Take over-the-counter and prescription medicines only as told by your doctor. Some medicines are not safe during pregnancy. Take a prenatal vitamin that contains at least 600 micrograms (mcg) of folic acid. Eating and drinking Eat healthy meals that include: Fresh fruits and vegetables. Whole grains. Good sources of protein, such as meat, eggs, or tofu. Low-fat dairy products. Avoid raw meat and unpasteurized juice, milk, and cheese. If you feel like you may vomit, or you vomit: Eat 4 or 5 small meals a day instead of 3 large meals. Try eating a few soda crackers. Drink liquids between meals instead of during meals. You may need to take these actions to prevent or treat trouble pooping: Drink enough fluids to keep your pee (urine) pale yellow. Eat foods that are high in fiber. These include beans, whole grains, and fresh  fruits and vegetables. Limit foods that are high in fat and sugar. These include fried or sweet foods. Activity Exercise only as told by your doctor. Most people can do their usual exercise routine during pregnancy. Stop exercising if you have cramps or pain in your lower belly (abdomen) or low back. Do not exercise if it is too hot or too humid, or if you are in a place of great height (high altitude). Avoid heavy lifting. If you choose to, you may have sex unless your doctor tells you not to. Relieving pain and discomfort Wear a good support bra if your breasts are sore. Rest with your legs raised (elevated) if you have leg cramps or low back pain. If you have bulging veins (varicose veins) in your legs: Wear support hose as told by your doctor. Raise your feet for 15 minutes, 3-4 times a day. Limit salt in your food. Safety Wear your seat belt at all times when you are in a car. Talk with your doctor if someone is hurting you or yelling at you. Talk with your doctor if you are feeling sad or have thoughts of hurting yourself. Lifestyle Do not use hot tubs, steam rooms, or saunas. Do not douche. Do not use tampons or scented sanitary pads. Do not use herbal medicines, illegal drugs, or medicines that are not approved by your doctor. Do not drink alcohol. Do not smoke or use any products that contain nicotine or tobacco. If you need help quitting, ask your doctor. Avoid cat litter boxes and soil that is used by cats. These  carry germs that can cause harm to the baby and can cause a loss of your baby by miscarriage or stillbirth. General instructions Keep all follow-up visits. This is important. Ask for help if you need counseling or if you need help with nutrition. Your doctor can give you advice or tell you where to go for help. Visit your dentist. At home, brush your teeth with a soft toothbrush. Floss gently. Write down your questions. Take them to your prenatal visits. Where to  find more information American Pregnancy Association: americanpregnancy.org [employer]: www.acog.org Office on Women's Health: [employer].nz Contact a doctor if: You are dizzy. You have a fever. You have mild cramps or pressure in your lower belly. You have a nagging pain in your belly area. You continue to feel like you may vomit, you vomit, or you have watery poop (diarrhea) for 24 hours or longer. You have a bad-smelling fluid coming from your vagina. You have pain when you pee. You are exposed to a disease that spreads from person to person, such as chickenpox, measles, Zika virus, HIV, or hepatitis. Get help right away if: You have spotting or bleeding from your vagina. You have very bad belly cramping or pain. You have shortness of breath or chest pain. You have any kind of injury, such as from a fall or a car crash. You have new or increased pain, swelling, or redness in an arm or leg. Summary The first trimester of pregnancy starts on the first day of your last menstrual period until the end of week 12 (months 1 through 3). Eat 4 or 5 small meals a day instead of 3 large meals. Do not smoke or use any products that contain nicotine or tobacco. If you need help quitting, ask your doctor. Keep all follow-up visits. This information is not intended to replace advice given to you by your health care provider. Make sure you discuss any questions you have with your health care provider. Document Revised: 07/03/2019 Document Reviewed: 05/09/2019 Elsevier Patient Education  2024 Elsevier Inc. Commonly Asked Questions During Pregnancy  Cats: A parasite can be excreted in cat feces.  To avoid exposure you need to have another person empty the little box.  If you must empty the litter box you will need to wear gloves.  Wash your hands after handling your cat.  This parasite can also be found in raw or undercooked meat so this should  also be avoided.  Colds, Sore Throats, Flu: Please check your medication sheet to see what you can take for symptoms.  If your symptoms are unrelieved by these medications please call the office.  Dental Work: Most any dental work Agricultural consultant recommends is permitted.  X-rays should only be taken during the first trimester if absolutely necessary.  Your abdomen should be shielded with a lead apron during all x-rays.  Please notify your provider prior to receiving any x-rays.  Novocaine is fine; gas is not recommended.  If your dentist requires a note from [place] prior to dental work please call the office and we will provide one for you.  Exercise: Exercise is an important part of staying healthy during your pregnancy.  You may continue most exercises you were accustomed to prior to pregnancy.  Later in your pregnancy you will most likely notice you have difficulty with activities requiring balance like riding a bicycle.  It is important that you listen to your body and avoid activities that put you at a  higher risk of falling.  Adequate rest and staying well hydrated are a must!  If you have questions about the safety of specific activities ask your provider.    Exposure to Children with illness: Try to avoid obvious exposure; report any symptoms to [place] when noted,  If you have chicken pos, red measles or mumps, you should be immune to these diseases.   Please do not take any vaccines while pregnant unless you have checked with your OB provider.  Fetal Movement: After 28 weeks we recommend you do "kick counts" twice daily.  Lie or sit down in a calm quiet environment and count your baby movements "kicks".  You should feel your baby at least 10 times per hour.  If you have not felt 10 kicks within the first hour get up, walk around and have something sweet to eat or drink then repeat for an additional hour.  If count remains less than 10 per hour notify your provider.  Fumigating: Follow your pest control  agent's advice as to how long to stay out of your home.  Ventilate the area well before re-entering.  Hemorrhoids:   Most over-the-counter preparations can be used during pregnancy.  Check your medication to see what is safe to use.  It is important to use a stool softener or fiber in your diet and to drink lots of liquids.  If hemorrhoids seem to be getting worse please call the office.   Hot Tubs:  Hot tubs Jacuzzis and saunas are not recommended while pregnant.  These increase your internal body temperature and should be avoided.  Intercourse:  Sexual intercourse is safe during pregnancy as long as you are comfortable, unless otherwise advised by your provider.  Spotting may occur after intercourse; report any bright red bleeding that is heavier than spotting.  Labor:  If you know that you are in labor, please go to the hospital.  If you are unsure, please call the office and let us help you decide what to do.  Lifting, straining, etc:  If your job requires heavy lifting or straining please check with your provider for any limitations.  Generally, you should not lift items heavier than that you can lift simply with your hands and arms (no back muscles)  Painting:  Paint fumes do not harm your pregnancy, but may make you ill and should be avoided if possible.  Latex or water based paints have less odor than oils.  Use adequate ventilation while painting.  Permanents & Hair Color:  Chemicals in hair dyes are not recommended as they cause increase hair dryness which can increase hair loss during pregnancy.  " Highlighting" and permanents are allowed.  Dye may be absorbed differently and permanents may not hold as well during pregnancy.  Sunbathing:  Use a sunscreen, as skin burns easily during pregnancy.  Drink plenty of fluids; avoid over heating.  Tanning Beds:  Because their possible side effects are still unknown, tanning beds are not recommended.  Ultrasound Scans:  Routine ultrasounds are  performed at approximately 20 weeks.  You will be able to see your baby's general anatomy an if you would like to know the gender this can usually be determined as well.  If it is questionable when you conceived you may also receive an ultrasound early in your pregnancy for dating purposes.  Otherwise ultrasound exams are not routinely performed unless there is a medical necessity.  Although you can request a scan we ask that you pay for it  when conducted because insurance does not cover " patient request" scans.  Work: If your pregnancy proceeds without complications you may work until your due date, unless your physician or employer advises otherwise.  Round Ligament Pain/Pelvic Discomfort:  Sharp, shooting pains not associated with bleeding are fairly common, usually occurring in the second trimester of pregnancy.  They tend to be worse when standing up or when you remain standing for long periods of time.  These are the result of pressure of certain pelvic ligaments called "round ligaments".  Rest, Tylenol and heat seem to be the most effective relief.  As the womb and fetus grow, they rise out of the pelvis and the discomfort improves.  Please notify the office if your pain seems different than that described.  It may represent a more serious condition.  n Medications Safe in Pregnancy  Acne:      Constipation:  Benzoyl Peroxide     Colace  Clindamycin      Dulcolax Suppository  Topica Erythromycin     Fibercon  Salicylic Acid      Metamucil         Miralax AVOID:        Senakot   Accutane    Cough:  Retin-A       Cough Drops  Tetracycline      Phenergan w/ Codeine if Rx  Minocycline      Robitussin (Plain & DM)  Antibiotics:     Crabs/Lice:  Ceclor       RID  Cephalosporins    AVOID:  E-Mycins      Kwell  Keflex  Macrobid/Macrodantin   Diarrhea:  Penicillin      Kao-Pectate  Zithromax      Imodium AD         PUSH FLUIDS AVOID:       Cipro     Fever:  Tetracycline      Tylenol  (Regular or Extra  Minocycline       Strength)  Levaquin      Extra Strength-Do not          Exceed 8 tabs/24 hrs Caffeine:        200mg /day (equiv. To 1 cup of coffee or  approx. 3 12 oz sodas)         Gas: Cold/Hayfever:       Gas-X  Benadryl      Mylicon  Claritin       Phazyme  **Claritin-D        Chlor-Trimeton    Headaches:  Dimetapp      ASA-Free Excedrin  Drixoral-Non-Drowsy     Cold Compress  Mucinex (Guaifenasin)     Tylenol (Regular or Extra  Sudafed/Sudafed-12 Hour     Strength)  **Sudafed PE Pseudoephedrine   Tylenol Cold & Sinus     Vicks Vapor Rub  Zyrtec  **AVOID if Problems With Blood Pressure         Heartburn: Avoid lying down for at least 1 hour after meals  Aciphex      Maalox     Rash:  Milk of Magnesia     Benadryl    Mylanta       1% Hydrocortisone Cream  Pepcid  Pepcid Complete   Sleep Aids:  Prevacid      Ambien   Prilosec       Benadryl  Rolaids       Chamomile Tea  Tums (Limit 4/day)     Unisom  Tylenol PM         Warm milk-add vanilla or  Hemorrhoids:       Sugar for taste  Anusol/Anusol H.C.  (RX: Analapram 2.5%)  Sugar Substitutes:  Hydrocortisone OTC     Ok in moderation  Preparation H      Tucks        Vaseline lotion applied to tissue with wiping    Herpes:     Throat:  Acyclovir      Oragel  Famvir  Valtrex     Vaccines:         Flu Shot Leg Cramps:       *Gardasil  Benadryl      Hepatitis A         Hepatitis B Nasal Spray:       Pneumovax  Saline Nasal Spray     Polio Booster         Tetanus Nausea:       Tuberculosis test or PPD  Vitamin B6 25 mg TID   AVOID:    Dramamine      *Gardasil  Emetrol       Live Poliovirus  Ginger Root 250 mg QID    MMR (measles, mumps &  High Complex Carbs @ Bedtime    rebella)  Sea Bands-Accupressure    Varicella (Chickenpox)  Unisom 1/2 tab TID     *No known complications           If received before Pain:         Known pregnancy;   Darvocet       Resume series  after  Lortab        Delivery  Percocet    Yeast:   Tramadol      Femstat  Tylenol 3      Gyne-lotrimin  Ultram       Monistat  Vicodin           MISC:         All Sunscreens           Hair Coloring/highlights          Insect Repellant's          (Including DEET)         Mystic Tans

## 2022-08-23 ENCOUNTER — Other Ambulatory Visit: Payer: Medicaid Other

## 2022-08-23 DIAGNOSIS — N912 Amenorrhea, unspecified: Secondary | ICD-10-CM

## 2022-08-24 ENCOUNTER — Ambulatory Visit: Payer: Medicaid Other

## 2022-08-24 LAB — BETA HCG QUANT (REF LAB): hCG Quant: 90 m[IU]/mL

## 2022-08-25 ENCOUNTER — Encounter: Payer: Self-pay | Admitting: Obstetrics and Gynecology

## 2022-08-26 ENCOUNTER — Emergency Department: Payer: Medicaid Other

## 2022-08-26 ENCOUNTER — Other Ambulatory Visit: Payer: Self-pay

## 2022-08-26 ENCOUNTER — Emergency Department
Admission: EM | Admit: 2022-08-26 | Discharge: 2022-08-26 | Disposition: A | Discharge status: Home / Self Care | Payer: Medicaid Other | Attending: Emergency Medicine | Admitting: Emergency Medicine

## 2022-08-26 ENCOUNTER — Telehealth: Payer: Self-pay

## 2022-08-26 DIAGNOSIS — R110 Nausea: Secondary | ICD-10-CM | POA: Diagnosis not present

## 2022-08-26 DIAGNOSIS — Z3A01 Less than 8 weeks gestation of pregnancy: Secondary | ICD-10-CM | POA: Diagnosis not present

## 2022-08-26 DIAGNOSIS — R102 Pelvic and perineal pain: Secondary | ICD-10-CM | POA: Diagnosis not present

## 2022-08-26 DIAGNOSIS — R1010 Upper abdominal pain, unspecified: Secondary | ICD-10-CM | POA: Diagnosis not present

## 2022-08-26 DIAGNOSIS — O4690 Antepartum hemorrhage, unspecified, unspecified trimester: Secondary | ICD-10-CM

## 2022-08-26 DIAGNOSIS — O209 Hemorrhage in early pregnancy, unspecified: Secondary | ICD-10-CM | POA: Diagnosis not present

## 2022-08-26 LAB — CBC WITH DIFFERENTIAL/PLATELET
Abs Immature Granulocytes: 0.02 10*3/uL (ref 0.00–0.07)
Basophils Absolute: 0.1 10*3/uL (ref 0.0–0.1)
Basophils Relative: 1 %
Eosinophils Absolute: 0.1 10*3/uL (ref 0.0–0.5)
Eosinophils Relative: 1 %
HCT: 42.5 % (ref 36.0–46.0)
Hemoglobin: 14.4 g/dL (ref 12.0–15.0)
Immature Granulocytes: 0 %
Lymphocytes Relative: 29 %
Lymphs Abs: 2.4 10*3/uL (ref 0.7–4.0)
MCH: 29.7 pg (ref 26.0–34.0)
MCHC: 33.9 g/dL (ref 30.0–36.0)
MCV: 87.6 fL (ref 80.0–100.0)
Monocytes Absolute: 0.6 10*3/uL (ref 0.1–1.0)
Monocytes Relative: 7 %
Neutro Abs: 5.2 10*3/uL (ref 1.7–7.7)
Neutrophils Relative %: 62 %
Platelets: 279 10*3/uL (ref 150–400)
RBC: 4.85 MIL/uL (ref 3.87–5.11)
RDW: 12.1 % (ref 11.5–15.5)
WBC: 8.3 10*3/uL (ref 4.0–10.5)
nRBC: 0 % (ref 0.0–0.2)

## 2022-08-26 LAB — BASIC METABOLIC PANEL
Anion gap: 8 (ref 5–15)
BUN: 8 mg/dL (ref 6–20)
CO2: 22 mmol/L (ref 22–32)
Calcium: 8.8 mg/dL — ABNORMAL LOW (ref 8.9–10.3)
Chloride: 105 mmol/L (ref 98–111)
Creatinine, Ser: 0.81 mg/dL (ref 0.44–1.00)
GFR, Estimated: >60 mL/min (ref >60)
Glucose, Bld: 86 mg/dL (ref 70–99)
Potassium: 4.2 mmol/L (ref 3.5–5.1)
Sodium: 135 mmol/L (ref 135–145)

## 2022-08-26 LAB — HCG, QUANTITATIVE, PREGNANCY: hCG, Beta Chain, Quant, S: 219 m[IU]/mL — ABNORMAL HIGH (ref <5)

## 2022-08-26 MED ORDER — ONDANSETRON 8 MG PO TBDP
8.0000 mg | ORAL_TABLET | Freq: Once | ORAL | Status: AC
  Administered 2022-08-26: 8 mg via ORAL

## 2022-08-26 MED ORDER — ACETAMINOPHEN 325 MG PO TABS
ORAL_TABLET | ORAL | Status: AC
  Administered 2022-08-26: 650 mg via ORAL
  Filled 2022-08-26: qty 2

## 2022-08-26 MED ORDER — ACETAMINOPHEN 325 MG PO TABS: 650.0000 mg | ORAL_TABLET | Freq: Once | ORAL | Status: AC

## 2022-08-26 NOTE — ED Triage Notes (Addendum)
Pt reports she is aprox [redacted] weeks pregnant and began having a large amount of vaginal bleeding earlier today. Pt is G4P1. Pt also reports mild vaginal cramping. Pt has seen Dr. Valentino Saxon at [facility]

## 2022-08-26 NOTE — Telephone Encounter (Signed)
Pt called triage to so msg could be taken down and sent to Dr. Valentino Saxon that she is at the ER due to heavy vaginal bleeding. She said she was advised by Dr. Valentino Saxon if she went through this again to call our office to let her know.

## 2022-08-26 NOTE — Telephone Encounter (Signed)
We will await the ER evaluation. They will notify myself and Maralyn Sago who are on call today if interventions are necessary.

## 2022-08-26 NOTE — Telephone Encounter (Signed)
Called pt, no answer, [facility]. 

## 2022-08-26 NOTE — ED Provider Notes (Signed)
[facility] Provider Note    Event Date/Time   First MD Initiated Contact with Patient 08/26/22 1530     (approximate)   History   Vaginal Bleeding   HPI  Tiffany Andrews is a 27 y.o. female  G4P1 with who is approx [redacted] weeks pregnant and started having a large amount of vaginal bleeding earlier today.  Patient reports pelvic cramping and some mild upper abdominal pain that began two days ago.  She also endorses some nausea but no vomiting.   Patient has had an appendectomy, cholecystectomy and a colon resection. LMP was 07/06/22, which would put her at [redacted]w[redacted]d, however patient stated she was told she was [redacted] weeks along based on her betaHCG by her OBGYN. She had a positive home pregnancy test on 08/15/22.      Physical Exam   Triage Vital Signs: ED Triage Vitals  Encounter Vitals Group     BP 08/26/22 1350 127/80     Systolic BP Percentile --      Diastolic BP Percentile --      Pulse Rate 08/26/22 1350 81     Resp 08/26/22 1350 16     Temp 08/26/22 1350 98.4 F (36.9 C)     Temp Source 08/26/22 1350 Oral     SpO2 08/26/22 1350 96 %     Weight 08/26/22 1349 221 lb (100.2 kg)     Height 08/26/22 1349 5\' 6"  (1.676 m)     Head Circumference --      Peak Flow --      Pain Score 08/26/22 1349 0     Pain Loc --      Pain Education --      Exclude from Growth Chart --     Most recent vital signs: Vitals:   08/26/22 1350 08/26/22 1607  BP: 127/80 114/69  Pulse: 81 81  Resp: 16 15  Temp: 98.4 F (36.9 C)   SpO2: 96% 100%   General: Awake, mild distress. CV:  Good peripheral perfusion. RRR. Resp:  Normal effort. CTAB. Abd:  No distention. Soft, TTP across lower abdomen due to cramping.    ED Results / Procedures / Treatments   Labs (all labs ordered are listed, but only abnormal results are displayed) Labs Reviewed  BASIC METABOLIC PANEL - Abnormal; Notable for the following components:      Result Value   Calcium 8.8 (*)    All other  components within normal limits  HCG, QUANTITATIVE, PREGNANCY - Abnormal; Notable for the following components:   hCG, Beta Chain, Quant, S 219 (*)    All other components within normal limits  CBC WITH DIFFERENTIAL/PLATELET     RADIOLOGY  Pelvic US obtained, I interpreted the images as well as reviewed the radiologist report.     PROCEDURES:  Critical Care performed: No  Procedures   MEDICATIONS ORDERED IN ED: Medications  acetaminophen (TYLENOL) tablet 650 mg (has no administration in time range)  ondansetron (ZOFRAN-ODT) disintegrating tablet 8 mg (8 mg Oral Given 08/26/22 1604)     IMPRESSION / MDM / ASSESSMENT AND PLAN / ED COURSE  I reviewed the triage vital signs and the nursing notes.                             16 y F approx [redacted] weeks pregnant presents with vaginal bleeding and cramping. VSS, mild distress on exam.   Differential diagnosis includes,  but is not limited to, 1st term miscarriage, ectopic pregnancy, subchorionic hematoma.   Patient's presentation is most consistent with acute complicated illness / injury requiring diagnostic workup.  Pelvic US obtained to r/o ectopic pregnancy and assess for viability. I interpreted the images as well as reviewed the radiologist report.  Ultrasound showed no evidence of an intrauterine pregnancy or ectopic pregnancy as well as a right sided corpus luteal cyst.  CBC and CMP unremarkable. BetaHCG was 219, which is lower than expected given patient's gestational age, however it is still trending up when compared with prior beta hCGs.  Patient was given Zofran for her nausea and Tylenol for pain.  Since patient's beta-hCG is still uptrending and she does not have a visible intrauterine pregnancy, it is possible that this is still an early pregnancy, however, given her cramping and bleeding I do feel a miscarriage is more likely.  I explained to the patient the results of the ultrasound and her beta-hCG.  I advised her to  return to the ED in 2 days for a redraw of her beta-hCG.  Patient was agreeable to plan, voiced understanding and was stable at discharge.     FINAL CLINICAL IMPRESSION(S) / ED DIAGNOSES   Final diagnoses:  Vaginal bleeding in pregnancy     Rx / DC Orders   ED Discharge Orders     None        Note:  This document was prepared using Dragon voice recognition software and may include unintentional dictation errors.   Cameron Ali, PA-C 08/26/22 1748    Minna Antis, MD 08/26/22 1904

## 2022-08-26 NOTE — Discharge Instructions (Signed)
Please return to the ED in 2 days for a redraw of your beta-hCG.  This will help [place] determine if you are having a miscarriage or if you still have a viable pregnancy.  You can take 650 mg of Tylenol every 6 hours as needed for pain.

## 2022-08-28 ENCOUNTER — Emergency Department
Admission: EM | Admit: 2022-08-28 | Discharge: 2022-08-28 | Disposition: A | Discharge status: Home / Self Care | Payer: Medicaid Other | Source: Home / Self Care | Attending: Emergency Medicine | Admitting: Emergency Medicine

## 2022-08-28 ENCOUNTER — Other Ambulatory Visit: Payer: Self-pay

## 2022-08-28 ENCOUNTER — Encounter: Payer: Self-pay | Admitting: Emergency Medicine

## 2022-08-28 DIAGNOSIS — Z3A01 Less than 8 weeks gestation of pregnancy: Secondary | ICD-10-CM | POA: Diagnosis not present

## 2022-08-28 DIAGNOSIS — O200 Threatened abortion: Secondary | ICD-10-CM | POA: Insufficient documentation

## 2022-08-28 DIAGNOSIS — O039 Complete or unspecified spontaneous abortion without complication: Secondary | ICD-10-CM

## 2022-08-28 LAB — HCG, QUANTITATIVE, PREGNANCY: hCG, Beta Chain, Quant, S: 120 m[IU]/mL — ABNORMAL HIGH (ref <5)

## 2022-08-28 NOTE — ED Provider Notes (Signed)
[facility] Provider Note    Event Date/Time   First MD Initiated Contact with Patient 08/28/22 1005     (approximate)   History   Labs Only   HPI  Tiffany Andrews is a 27 y.o. female G5, P1 currently approximately [redacted] weeks pregnant who presents today with request for recheck of blood test.  Patient reports that she was seen 2 days ago at which time she had pelvic cramping and nausea.  She reports that her cramping and nausea have improved significantly, though she had more bleeding last night including clots.  She reports that this is consistent with her previous miscarriages.  She was told to return today for repeat hCG.  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   Vasovagal syncope 07/07/2021   Low back pain 06/02/2021   Anxiety 04/01/2021   Bradycardia 04/01/2021   SVT (supraventricular tachycardia) 04/01/2021   S/P cesarean section 09/27/2017   Obesity (BMI 30.0-34.9) 04/26/2017   Encounter for procreative genetic counseling 03/15/2017          Physical Exam   Triage Vital Signs: ED Triage Vitals  Encounter Vitals Group     BP 08/28/22 0940 118/77     Systolic BP Percentile --      Diastolic BP Percentile --      Pulse Rate 08/28/22 0940 87     Resp 08/28/22 0940 17     Temp 08/28/22 0940 98.4 F (36.9 C)     Temp src --      SpO2 08/28/22 0940 98 %     Weight 08/28/22 0936 221 lb (100.2 kg)     Height 08/28/22 0936 5\' 6"  (1.676 m)     Head Circumference --      Peak Flow --      Pain Score 08/28/22 0936 3     Pain Loc --      Pain Education --      Exclude from Growth Chart --     Most recent vital signs: Vitals:   08/28/22 0940  BP: 118/77  Pulse: 87  Resp: 17  Temp: 98.4 F (36.9 C)  SpO2: 98%    Physical Exam Vitals and nursing note reviewed.   Constitutional:      General: Awake and alert. No acute distress.    Appearance: Normal appearance. The patient is obese.  HENT:     Head: Normocephalic and atraumatic.     Mouth: Mucous membranes are moist.  Eyes:     General: PERRL. Normal EOMs        Right eye: No discharge.        Left eye: No discharge.     Conjunctiva/sclera: Conjunctivae normal.  Cardiovascular:     Rate and Rhythm: Normal rate and regular rhythm.     Pulses: Normal pulses.  Pulmonary:     Effort: Pulmonary effort is normal. No respiratory distress.     Breath sounds: Normal breath sounds.  Abdominal:     Abdomen is soft. There is no abdominal tenderness. No rebound or guarding. No distention. Musculoskeletal:        General: No swelling. Normal range of motion.     Cervical back: Normal range of motion and neck supple.  Skin:    General: Skin is warm and dry.     Capillary Refill:  Capillary refill takes less than 2 seconds.     Findings: No rash.  Neurological:     Mental Status: The patient is awake and alert.      ED Results / Procedures / Treatments   Labs (all labs ordered are listed, but only abnormal results are displayed) Labs Reviewed  HCG, QUANTITATIVE, PREGNANCY - Abnormal; Notable for the following components:      Result Value   hCG, Beta Chain, Quant, S 120 (*)    All other components within normal limits     EKG     RADIOLOGY     PROCEDURES:  Critical Care performed:   Procedures   MEDICATIONS ORDERED IN ED: Medications - No data to display   IMPRESSION / MDM / ASSESSMENT AND PLAN / ED COURSE  I reviewed the triage vital signs and the nursing notes.   Differential diagnosis includes, but is not limited to, threatened abortion, inevitable abortion, completed abortion, intrauterine pregnancy.  I reviewed the patient's chart.  Patient was seen in the emergency department on 08/26/2022 at which time she had a beta-hCG of 219.  Ultrasound revealed no evidence of  intrauterine pregnancy or ectopic pregnancy.  She was advised to return the emergency department in 2 days to have a redraw of her beta-hCG.  She is also Rh+.  hCG today appears to be downtrending to 120.  No indication for repeat ultrasound today given downtrending hCG and lack of abdominal pain.  Clinical findings and patient's history most consistent with spontaneous abortion.  Given that this is her fourth miscarriage, I recommended that she speak with her OB/GYN regarding genetic testing for possible chromosomal abnormalities.  We discussed return precautions and outpatient follow-up.  Patient understands and agrees with plan.  She was discharged in stable condition.   Patient's presentation is most consistent with acute complicated illness / injury requiring diagnostic workup.      FINAL CLINICAL IMPRESSION(S) / ED DIAGNOSES   Final diagnoses:  Miscarriage     Rx / DC Orders   ED Discharge Orders     None        Note:  This document was prepared using Dragon voice recognition software and may include unintentional dictation errors.   Keturah Shavers 08/28/22 1122    Shaune Pollack, MD 08/28/22 1944

## 2022-08-28 NOTE — Discharge Instructions (Signed)
Your serum hCG is downtrending today, consistent with a miscarriage especially given the heavy bleeding you described last night.  Please follow-up with your OB/GYN as needed.  Please return to the emergency department if develop any new, worsening, or change in symptoms or other concerns.  It was a pleasure caring for you today.

## 2022-08-28 NOTE — ED Triage Notes (Signed)
Pt states coming in for repeat blood work to check for a miscarage. Pt states she was here 2 days ago and was told to come back today.

## 2022-09-12 ENCOUNTER — Ambulatory Visit: Admission: RE | Admit: 2022-09-12 | Payer: Medicaid Other | Source: Ambulatory Visit

## 2022-09-13 ENCOUNTER — Emergency Department
Admission: EM | Admit: 2022-09-13 | Discharge: 2022-09-14 | Disposition: A | Discharge status: Home / Self Care | Payer: Medicaid Other | Source: Home / Self Care | Attending: Student in an Organized Health Care Education/Training Program | Admitting: Student in an Organized Health Care Education/Training Program

## 2022-09-13 ENCOUNTER — Encounter: Payer: Self-pay | Admitting: Emergency Medicine

## 2022-09-13 ENCOUNTER — Other Ambulatory Visit: Payer: Self-pay

## 2022-09-13 ENCOUNTER — Emergency Department: Payer: Medicaid Other

## 2022-09-13 DIAGNOSIS — Z9889 Other specified postprocedural states: Secondary | ICD-10-CM | POA: Diagnosis not present

## 2022-09-13 DIAGNOSIS — Z9049 Acquired absence of other specified parts of digestive tract: Secondary | ICD-10-CM | POA: Diagnosis not present

## 2022-09-13 DIAGNOSIS — O00102 Left tubal pregnancy without intrauterine pregnancy: Secondary | ICD-10-CM | POA: Diagnosis not present

## 2022-09-13 DIAGNOSIS — E669 Obesity, unspecified: Secondary | ICD-10-CM | POA: Diagnosis not present

## 2022-09-13 DIAGNOSIS — O209 Hemorrhage in early pregnancy, unspecified: Secondary | ICD-10-CM | POA: Insufficient documentation

## 2022-09-13 DIAGNOSIS — Z6835 Body mass index (BMI) 35.0-35.9, adult: Secondary | ICD-10-CM | POA: Diagnosis not present

## 2022-09-13 DIAGNOSIS — Z3A00 Weeks of gestation of pregnancy not specified: Secondary | ICD-10-CM | POA: Insufficient documentation

## 2022-09-13 DIAGNOSIS — O3680X0 Pregnancy with inconclusive fetal viability, not applicable or unspecified: Secondary | ICD-10-CM

## 2022-09-13 DIAGNOSIS — N939 Abnormal uterine and vaginal bleeding, unspecified: Secondary | ICD-10-CM

## 2022-09-13 LAB — COMPREHENSIVE METABOLIC PANEL
ALT: 13 U/L (ref 0–44)
AST: 15 U/L (ref 15–41)
Albumin: 3.8 g/dL (ref 3.5–5.0)
Alkaline Phosphatase: 56 U/L (ref 38–126)
Anion gap: 8 (ref 5–15)
BUN: 9 mg/dL (ref 6–20)
CO2: 25 mmol/L (ref 22–32)
Calcium: 8.8 mg/dL — ABNORMAL LOW (ref 8.9–10.3)
Chloride: 103 mmol/L (ref 98–111)
Creatinine, Ser: 0.76 mg/dL (ref 0.44–1.00)
GFR, Estimated: >60 mL/min (ref >60)
Glucose, Bld: 86 mg/dL (ref 70–99)
Potassium: 4.1 mmol/L (ref 3.5–5.1)
Sodium: 136 mmol/L (ref 135–145)
Total Bilirubin: 1.1 mg/dL (ref 0.3–1.2)
Total Protein: 7.5 g/dL (ref 6.5–8.1)

## 2022-09-13 LAB — CBC WITH DIFFERENTIAL/PLATELET
Abs Immature Granulocytes: 0.02 10*3/uL (ref 0.00–0.07)
Basophils Absolute: 0.1 10*3/uL (ref 0.0–0.1)
Basophils Relative: 1 %
Eosinophils Absolute: 0.1 10*3/uL (ref 0.0–0.5)
Eosinophils Relative: 1 %
HCT: 42.5 % (ref 36.0–46.0)
Hemoglobin: 13.7 g/dL (ref 12.0–15.0)
Immature Granulocytes: 0 %
Lymphocytes Relative: 23 %
Lymphs Abs: 2.3 10*3/uL (ref 0.7–4.0)
MCH: 28.7 pg (ref 26.0–34.0)
MCHC: 32.2 g/dL (ref 30.0–36.0)
MCV: 89.1 fL (ref 80.0–100.0)
Monocytes Absolute: 0.7 10*3/uL (ref 0.1–1.0)
Monocytes Relative: 7 %
Neutro Abs: 6.9 10*3/uL (ref 1.7–7.7)
Neutrophils Relative %: 68 %
Platelets: 271 10*3/uL (ref 150–400)
RBC: 4.77 MIL/uL (ref 3.87–5.11)
RDW: 12.2 % (ref 11.5–15.5)
WBC: 10.1 10*3/uL (ref 4.0–10.5)
nRBC: 0 % (ref 0.0–0.2)

## 2022-09-13 LAB — URINALYSIS, ROUTINE W REFLEX MICROSCOPIC
Bilirubin Urine: NEGATIVE
Glucose, UA: NEGATIVE mg/dL
Ketones, ur: NEGATIVE mg/dL
Leukocytes,Ua: NEGATIVE
Nitrite: NEGATIVE
Protein, ur: NEGATIVE mg/dL
Specific Gravity, Urine: 1.02 (ref 1.005–1.030)
pH: 7 (ref 5.0–8.0)

## 2022-09-13 LAB — HCG, QUANTITATIVE, PREGNANCY: hCG, Beta Chain, Quant, S: 52 m[IU]/mL — ABNORMAL HIGH (ref <5)

## 2022-09-13 LAB — POC URINE PREG, ED: Preg Test, Ur: POSITIVE — AB

## 2022-09-13 NOTE — ED Triage Notes (Signed)
Patient ambulatory to triage with steady gait, without difficulty or distress noted; pt presents with persistent vag bleeding and cramping; was here 7/21 and told due to her down trending HCG that she has miscarried; has not f/u with OB with instructed

## 2022-09-14 ENCOUNTER — Encounter
Admission: RE | Disposition: A | Discharge status: Home / Self Care | Payer: Self-pay | Source: Ambulatory Visit | Attending: Obstetrics and Gynecology

## 2022-09-14 ENCOUNTER — Other Ambulatory Visit: Payer: Self-pay

## 2022-09-14 ENCOUNTER — Ambulatory Visit
Admission: RE | Admit: 2022-09-14 | Discharge: 2022-09-14 | Disposition: A | Discharge status: Home / Self Care | Payer: Medicaid Other | Source: Ambulatory Visit | Attending: Obstetrics and Gynecology | Admitting: Obstetrics and Gynecology

## 2022-09-14 ENCOUNTER — Ambulatory Visit: Payer: Medicaid Other | Admitting: Anesthesiology

## 2022-09-14 ENCOUNTER — Encounter: Payer: Self-pay | Admitting: Obstetrics and Gynecology

## 2022-09-14 DIAGNOSIS — O00102 Left tubal pregnancy without intrauterine pregnancy: Secondary | ICD-10-CM | POA: Insufficient documentation

## 2022-09-14 DIAGNOSIS — Z9889 Other specified postprocedural states: Secondary | ICD-10-CM | POA: Insufficient documentation

## 2022-09-14 DIAGNOSIS — Z6835 Body mass index (BMI) 35.0-35.9, adult: Secondary | ICD-10-CM | POA: Insufficient documentation

## 2022-09-14 DIAGNOSIS — E669 Obesity, unspecified: Secondary | ICD-10-CM | POA: Insufficient documentation

## 2022-09-14 DIAGNOSIS — Z9049 Acquired absence of other specified parts of digestive tract: Secondary | ICD-10-CM | POA: Insufficient documentation

## 2022-09-14 HISTORY — PX: DILATION AND CURETTAGE OF UTERUS: SHX78

## 2022-09-14 HISTORY — PX: DIAGNOSTIC LAPAROSCOPY WITH REMOVAL OF ECTOPIC PREGNANCY: SHX6449

## 2022-09-14 SURGERY — LAPAROSCOPY, WITH ECTOPIC PREGNANCY SURGICAL TREATMENT
Anesthesia: General | Laterality: Left

## 2022-09-14 MED ORDER — PROPOFOL 1000 MG/100ML IV EMUL
INTRAVENOUS | Status: AC
  Filled 2022-09-14: qty 100

## 2022-09-14 MED ORDER — ORAL CARE MOUTH RINSE: 15.0000 mL | Freq: Once | OROMUCOSAL | Status: DC

## 2022-09-14 MED ORDER — ONDANSETRON HCL 4 MG/2ML IJ SOLN
INTRAMUSCULAR | Status: AC
  Filled 2022-09-14: qty 2

## 2022-09-14 MED ORDER — FENTANYL CITRATE (PF) 100 MCG/2ML IJ SOLN: 25.0000 ug | INTRAMUSCULAR | Status: DC | PRN

## 2022-09-14 MED ORDER — OXYCODONE HCL 5 MG PO TABS
ORAL_TABLET | ORAL | Status: AC
  Filled 2022-09-14: qty 1

## 2022-09-14 MED ORDER — ACETAMINOPHEN 10 MG/ML IV SOLN
INTRAVENOUS | Status: AC
  Filled 2022-09-14: qty 100

## 2022-09-14 MED ORDER — IBUPROFEN 800 MG PO TABS: 800.0000 mg | ORAL_TABLET | Freq: Three times a day (TID) | ORAL | 1 refills | Status: AC

## 2022-09-14 MED ORDER — PROPOFOL 10 MG/ML IV BOLUS
INTRAVENOUS | Status: DC | PRN
Start: 2022-09-14 — End: 2022-09-14
  Administered 2022-09-14: 200 mg via INTRAVENOUS

## 2022-09-14 MED ORDER — DEXAMETHASONE SODIUM PHOSPHATE 10 MG/ML IJ SOLN
INTRAMUSCULAR | Status: DC | PRN
  Administered 2022-09-14: 10 mg via INTRAVENOUS

## 2022-09-14 MED ORDER — ROCURONIUM BROMIDE 100 MG/10ML IV SOLN
INTRAVENOUS | Status: DC | PRN
  Administered 2022-09-14: 50 mg via INTRAVENOUS
  Administered 2022-09-14: 30 mg via INTRAVENOUS

## 2022-09-14 MED ORDER — OXYCODONE HCL 5 MG/5ML PO SOLN: 5.0000 mg | Freq: Once | ORAL | Status: AC | PRN

## 2022-09-14 MED ORDER — FENTANYL CITRATE (PF) 100 MCG/2ML IJ SOLN
INTRAMUSCULAR | Status: AC
  Filled 2022-09-14: qty 2

## 2022-09-14 MED ORDER — OXYCODONE-ACETAMINOPHEN 5-325 MG PO TABS
1.0000 | ORAL_TABLET | Freq: Once | ORAL | Status: AC
  Administered 2022-09-14: 1 via ORAL
  Filled 2022-09-14: qty 1

## 2022-09-14 MED ORDER — BUPIVACAINE HCL 0.5 % IJ SOLN
INTRAMUSCULAR | Status: DC | PRN
  Administered 2022-09-14: 15 mL

## 2022-09-14 MED ORDER — MIDAZOLAM HCL 2 MG/2ML IJ SOLN
INTRAMUSCULAR | Status: AC
  Filled 2022-09-14: qty 2

## 2022-09-14 MED ORDER — KETOROLAC TROMETHAMINE 30 MG/ML IJ SOLN
INTRAMUSCULAR | Status: DC | PRN
  Administered 2022-09-14: 30 mg via INTRAVENOUS

## 2022-09-14 MED ORDER — DOXYCYCLINE HYCLATE 100 MG PO TABS
200.0000 mg | ORAL_TABLET | Freq: Once | ORAL | Status: AC
  Administered 2022-09-14: 200 mg via ORAL
  Filled 2022-09-14: qty 2

## 2022-09-14 MED ORDER — BUPIVACAINE HCL (PF) 0.5 % IJ SOLN
INTRAMUSCULAR | Status: AC
  Filled 2022-09-14: qty 30

## 2022-09-14 MED ORDER — FENTANYL CITRATE (PF) 100 MCG/2ML IJ SOLN
INTRAMUSCULAR | Status: DC | PRN
  Administered 2022-09-14: 25 ug via INTRAVENOUS
  Administered 2022-09-14 (×3): 50 ug via INTRAVENOUS
  Administered 2022-09-14: 25 ug via INTRAVENOUS

## 2022-09-14 MED ORDER — OXYCODONE HCL 5 MG PO TABS
5.0000 mg | ORAL_TABLET | ORAL | 0 refills | Status: DC | PRN
Start: 2022-09-14 — End: 2023-02-09

## 2022-09-14 MED ORDER — LIDOCAINE HCL (CARDIAC) PF 100 MG/5ML IV SOSY
PREFILLED_SYRINGE | INTRAVENOUS | Status: DC | PRN
  Administered 2022-09-14: 100 mg via INTRAVENOUS

## 2022-09-14 MED ORDER — LACTATED RINGERS IV SOLN: INTRAVENOUS | Status: DC

## 2022-09-14 MED ORDER — ONDANSETRON HCL 4 MG/2ML IJ SOLN
4.0000 mg | Freq: Once | INTRAMUSCULAR | Status: AC | PRN
  Administered 2022-09-14: 4 mg via INTRAVENOUS

## 2022-09-14 MED ORDER — ACETAMINOPHEN EXTRA STRENGTH 500 MG PO TABS: 1000.0000 mg | ORAL_TABLET | Freq: Four times a day (QID) | ORAL | 0 refills | Status: AC

## 2022-09-14 MED ORDER — OXYCODONE HCL 5 MG PO TABS
5.0000 mg | ORAL_TABLET | Freq: Once | ORAL | Status: AC | PRN
  Administered 2022-09-14: 5 mg via ORAL

## 2022-09-14 MED ORDER — MIDAZOLAM HCL 2 MG/2ML IJ SOLN
INTRAMUSCULAR | Status: DC | PRN
  Administered 2022-09-14: 2 mg via INTRAVENOUS

## 2022-09-14 MED ORDER — OXYTOCIN 10 UNIT/ML IJ SOLN
INTRAMUSCULAR | Status: AC
  Filled 2022-09-14: qty 3

## 2022-09-14 MED ORDER — ACETAMINOPHEN 10 MG/ML IV SOLN
INTRAVENOUS | Status: DC | PRN
  Administered 2022-09-14: 1000 mg via INTRAVENOUS

## 2022-09-14 MED ORDER — SUGAMMADEX SODIUM 200 MG/2ML IV SOLN
INTRAVENOUS | Status: DC | PRN
  Administered 2022-09-14 (×2): 200 mg via INTRAVENOUS

## 2022-09-14 MED ORDER — ACETAMINOPHEN 10 MG/ML IV SOLN: 1000.0000 mg | Freq: Once | INTRAVENOUS | Status: DC | PRN

## 2022-09-14 MED ORDER — ONDANSETRON HCL 4 MG/2ML IJ SOLN
INTRAMUSCULAR | Status: DC | PRN
  Administered 2022-09-14: 4 mg via INTRAVENOUS

## 2022-09-14 MED ORDER — PROPOFOL 500 MG/50ML IV EMUL
INTRAVENOUS | Status: DC | PRN
Start: 2022-09-14 — End: 2022-09-14
  Administered 2022-09-14: 150 ug/kg/min via INTRAVENOUS

## 2022-09-14 MED ORDER — CHLORHEXIDINE GLUCONATE 0.12 % MT SOLN: 15.0000 mL | Freq: Once | OROMUCOSAL | Status: DC

## 2022-09-14 SURGICAL SUPPLY — 77 items
ADH SKN CLS APL DERMABOND .7 (GAUZE/BANDAGES/DRESSINGS) ×3
APL SRG 38 LTWT LNG FL B (MISCELLANEOUS) ×3
APPLICATOR ARISTA FLEXITIP XL (MISCELLANEOUS) ×3 IMPLANT
BAG DRN RND TRDRP ANRFLXCHMBR (UROLOGICAL SUPPLIES)
BAG URINE DRAIN 2000ML AR STRL (UROLOGICAL SUPPLIES) ×2 IMPLANT
BLADE SURG SZ11 CARB STEEL (BLADE) ×3 IMPLANT
CATH FOLEY 2WAY 5CC 16FR (CATHETERS) ×3
CATH URTH 16FR FL 2W BLN LF (CATHETERS) ×3 IMPLANT
CORD MONOPOLAR M/FML 12FT (MISCELLANEOUS) ×3 IMPLANT
COVER LIGHT HANDLE STERIS (MISCELLANEOUS) ×2 IMPLANT
DERMABOND ADVANCED .7 DNX12 (GAUZE/BANDAGES/DRESSINGS) ×3 IMPLANT
DRAPE STERI POUCH LG 24X46 STR (DRAPES) IMPLANT
DRSG TELFA 3X8 NADH STRL (GAUZE/BANDAGES/DRESSINGS) IMPLANT
FILTER UTR ASPR SPEC (MISCELLANEOUS) ×3 IMPLANT
FLTR UTR ASPR SPEC (MISCELLANEOUS) ×3
GAUZE 4X4 16PLY ~~LOC~~+RFID DBL (SPONGE) ×3 IMPLANT
GLOVE BIO SURGEON STRL SZ7 (GLOVE) ×6 IMPLANT
GLOVE INDICATOR 7.5 STRL GRN (GLOVE) ×3 IMPLANT
GLOVE SURG SYN 8.0 (GLOVE) ×18 IMPLANT
GLOVE SURG SYN 8.0 PF PI (GLOVE) ×2 IMPLANT
GOWN STRL REUS W/ TWL LRG LVL3 (GOWN DISPOSABLE) ×6 IMPLANT
GOWN STRL REUS W/ TWL XL LVL3 (GOWN DISPOSABLE) ×3 IMPLANT
GOWN STRL REUS W/TWL LRG LVL3 (GOWN DISPOSABLE) ×6
GOWN STRL REUS W/TWL XL LVL3 (GOWN DISPOSABLE) ×3
GRASPER SUT TROCAR 14GX15 (MISCELLANEOUS) ×1 IMPLANT
HANDLE YANKAUER SUCT BULB TIP (MISCELLANEOUS) IMPLANT
HEMOSTAT ARISTA ABSORB 3G PWDR (HEMOSTASIS) IMPLANT
IRRIGATION STRYKERFLOW (MISCELLANEOUS) IMPLANT
IRRIGATOR STRYKERFLOW (MISCELLANEOUS)
IV NS 1000ML (IV SOLUTION)
IV NS 1000ML BAXH (IV SOLUTION) ×2 IMPLANT
KIT BERKELEY 1ST TRIMESTER 3/8 (MISCELLANEOUS) ×3 IMPLANT
KIT PINK PAD W/HEAD ARE REST (MISCELLANEOUS) ×3 IMPLANT
KIT PINK PAD W/HEAD ARM REST (MISCELLANEOUS) ×3 IMPLANT
KIT TURNOVER CYSTO (KITS) ×3 IMPLANT
L-HOOK LAP DISP 36CM (ELECTROSURGICAL)
LABEL OR SOLS (LABEL) ×3 IMPLANT
LHOOK LAP DISP 36CM (ELECTROSURGICAL) IMPLANT
LIGASURE VESSEL 5MM BLUNT TIP (ELECTROSURGICAL) ×1 IMPLANT
MANIFOLD NEPTUNE II (INSTRUMENTS) ×3 IMPLANT
MANIPULATOR UTERINE 4.5 ZUMI (MISCELLANEOUS) IMPLANT
NDL HYPO 22X1.5 SAFETY MO (MISCELLANEOUS) IMPLANT
NEEDLE HYPO 22X1.5 SAFETY MO (MISCELLANEOUS) IMPLANT
NS IRRIG 500ML POUR BTL (IV SOLUTION) ×3 IMPLANT
PACK DNC HYST (MISCELLANEOUS) ×3 IMPLANT
PACK GYN LAPAROSCOPIC (MISCELLANEOUS) ×3 IMPLANT
PAD OB MATERNITY 4.3X12.25 (PERSONAL CARE ITEMS) ×3 IMPLANT
PAD PREP OB/GYN DISP 24X41 (PERSONAL CARE ITEMS) ×3 IMPLANT
SCISSORS METZENBAUM CVD 33 (INSTRUMENTS) IMPLANT
SCRUB CHG 4% DYNA-HEX 4OZ (MISCELLANEOUS) ×3 IMPLANT
SET BERKELEY SUCTION TUBING (SUCTIONS) ×3 IMPLANT
SET CYSTO W/LG BORE CLAMP LF (SET/KITS/TRAYS/PACK) IMPLANT
SET TUBE SMOKE EVAC HIGH FLOW (TUBING) ×3 IMPLANT
SLEEVE Z-THREAD 5X100MM (TROCAR) ×3 IMPLANT
SOL PREP PVP 2OZ (MISCELLANEOUS) ×3
SOLUTION PREP PVP 2OZ (MISCELLANEOUS) ×3 IMPLANT
STRIP CLOSURE SKIN 1/4X4 (GAUZE/BANDAGES/DRESSINGS) ×1 IMPLANT
SUT MNCRL 4-0 (SUTURE) ×3
SUT MNCRL 4-0 27XMFL (SUTURE) ×3
SUT MNCRL AB 4-0 PS2 18 (SUTURE) ×3 IMPLANT
SUTURE MNCRL 4-0 27XMF (SUTURE) ×1 IMPLANT
SYR 10ML LL (SYRINGE) IMPLANT
SYR 50ML LL SCALE MARK (SYRINGE) IMPLANT
SYR 5ML LL (SYRINGE) IMPLANT
SYS BAG RETRIEVAL 10MM (BASKET) ×3
SYSTEM BAG RETRIEVAL 10MM (BASKET) ×1 IMPLANT
TOWEL OR 17X26 4PK STRL BLUE (TOWEL DISPOSABLE) ×3 IMPLANT
TRAP FLUID SMOKE EVACUATOR (MISCELLANEOUS) ×3 IMPLANT
TROCAR Z-THRD FIOS HNDL 11X100 (TROCAR) ×1 IMPLANT
TROCAR Z-THREAD FIOS 5X100MM (TROCAR) ×3 IMPLANT
TUBING ART PRESS 48 MALE/FEM (TUBING) IMPLANT
VACURETTE 10 RIGID CVD (CANNULA) IMPLANT
VACURETTE 6 ASPIR F TIP BERK (CANNULA) IMPLANT
VACURETTE 7MM F TIP STRL (CANNULA) IMPLANT
VACURETTE 8 RIGID CVD (CANNULA) IMPLANT
VACURETTE 8MM F TIP (MISCELLANEOUS) ×2 IMPLANT
WATER STERILE IRR 500ML POUR (IV SOLUTION) ×3 IMPLANT

## 2022-09-14 NOTE — Discharge Instructions (Addendum)
Laparoscopic Tubal Removal for Ectopic Pregnancy Discharge Instructions  Laparoscopic tubal removal is a procedure that removes the fallopian tube containing the ectopic pregnancy.  For the next three days, take ibuprofen and acetaminophen on a schedule, every 8 hours. You can take them together or you can intersperse them, and take one every four hours. I also gave you gabapentin for nighttime, to help you sleep and also to control pain. Take gabapentin medicines at night for at least the next 3 nights. You also have a narcotic, oxycodone, to take as needed if the above medicines don't help.  Postop constipation is a major cause of pain. Stay well hydrated, walk as you tolerate, and take over the counter senna as well as stool softeners if you need them.  RISKS AND COMPLICATIONS  Infection. Bleeding. Injury to surrounding organs. Anesthetic side effects. Failure of the procedure. Risks of future ectopic pregnancy on the other side PROCEDURE  You may be given a medicine to help you relax (sedative) before the procedure. You will be given a medicine to make you sleep (general anesthetic) during the procedure. A tube will be put down your throat to help your breath while under general anesthesia. Two small cuts (incisions) are made in the lower abdominal area and one incision is made near the belly button. Your abdominal area will be inflated with a safe gas (carbon dioxide). This helps give the surgeon room to operate, visualize, and helps the surgeon avoid other organs. A thin, lighted tube (laparoscope) with a camera attached is inserted into your abdomen through the incision near the belly button. Other small instruments are also inserted through the other abdominal incisions. The fallopian tube is located and are removed. After the fallopian tube is removed, the gas is released from the abdomen. The incisions will be closed with stitches (sutures), and Dermabond. A bandage may be  placed over the incisions. AFTER THE PROCEDURE  You will also have some mild abdominal discomfort for 3-7 days. You will be given pain medicine to ease any discomfort. As long as there are no problems, you may be allowed to go home. Someone will need to drive you home and be with you for at least 24 hours once home. You may have some mild discomfort in the throat. This is from the tube placed in your throat while you were sleeping. You may experience discomfort in the shoulder area from some trapped air between the liver and diaphragm. This sensation is normal and will slowly go away on its own. HOME CARE INSTRUCTIONS  Take all medicines as directed. Only take over-the-counter or prescription medicines for pain, discomfort, or fever as directed by your caregiver. Resume daily activities as directed. Showers are preferred over baths. You may resume sexual activities in 1 week or as directed. Do not drive while taking narcotics. SEEK MEDICAL CARE IF: . There is increasing abdominal pain. You feel lightheaded or faint. You have the chills. You have an oral temperature above 102 F (38.9 C). There is pus-like (purulent) drainage from any of the wounds. You are unable to pass gas or have a bowel movement. You feel sick to your stomach (nauseous) or throw up (vomit). MAKE SURE YOU:  Understand these instructions. Will watch your condition. Will get help right away if you are not doing well or get worse.  Exit[employer] Patient Information 2013 [place], [place].       AMBULATORY SURGERY  DISCHARGE INSTRUCTIONS   The drugs that you were  given will stay in your system until tomorrow so for the next 24 hours you should not:  Drive an automobile Make any legal decisions Drink any alcoholic beverage   You may resume regular meals tomorrow.  Today it is better to start with liquids and gradually work up to solid foods.  You may eat anything you prefer, but it is better to start with  liquids, then soup and crackers, and gradually work up to solid foods.   Please notify your doctor immediately if you have any unusual bleeding, trouble breathing, redness and pain at the surgery site, drainage, fever, or pain not relieved by medication.     Your post-operative visit with Dr.                                       is: Date:                        Time:    Please call to schedule your post-operative visit.  Additional Instructions:
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
## 2022-09-14 NOTE — ED Provider Notes (Signed)
[facility] Provider Note  Patient Contact: 12:42 AM (approximate)   History   Vaginal Bleeding   HPI  Tiffany Andrews is a 27 y.o. female who presents urgency department complaining of ongoing vaginal bleeding.  Patient was seen here 2-1/2 weeks ago, had an hCG of 219, vaginal bleeding without intrauterine pregnancy identified on ultrasound.  Repeat hCG dropped 220 and will suspect that patient was having a miscarriage.  She was to follow-up with OB/GYN but did not.  Patient presents to the ED as she has had ongoing bleeding.  She states that she is changing her pad every 2 to 2-1/2 hours.  Patient does not have any significant abdominal pain.  There is no fevers, chills, urinary changes, GI symptoms.  Patient is concerned that she may need a D&C if she has had a previous miscarriage that ultimately resulted in a D&C.  Given the ongoing bleeding she is concerned that she may need this again.  No other complaints.     Physical Exam   Triage Vital Signs: ED Triage Vitals  Encounter Vitals Group     BP 09/13/22 2005 121/75     Systolic BP Percentile --      Diastolic BP Percentile --      Pulse Rate 09/13/22 2005 74     Resp 09/13/22 2005 16     Temp 09/13/22 2005 98.5 F (36.9 C)     Temp Source 09/13/22 2005 Oral     SpO2 09/13/22 2005 98 %     Weight 09/13/22 1957 220 lb 14.4 oz (100.2 kg)     Height 09/13/22 1957 5\' 6"  (1.676 m)     Head Circumference --      Peak Flow --      Pain Score 09/13/22 1957 4     Pain Loc --      Pain Education --      Exclude from Growth Chart --     Most recent vital signs: Vitals:   09/13/22 2005  BP: 121/75  Pulse: 74  Resp: 16  Temp: 98.5 F (36.9 C)  SpO2: 98%     General: Alert and in no acute distress.  Cardiovascular:  Good peripheral perfusion Respiratory: Normal respiratory effort without tachypnea or retractions. Lungs CTAB.  Gastrointestinal: Bowel sounds 4 quadrants. Soft and nontender to  palpation. No guarding or rigidity. No palpable masses. No distention. No CVA tenderness. Musculoskeletal: Full range of motion to all extremities.  Neurologic:  No gross focal neurologic deficits are appreciated.  Skin:   No rash noted Other:   ED Results / Procedures / Treatments   Labs (all labs ordered are listed, but only abnormal results are displayed) Labs Reviewed  URINALYSIS, ROUTINE W REFLEX MICROSCOPIC - Abnormal; Notable for the following components:      Result Value   Color, Urine YELLOW (*)    APPearance HAZY (*)    Hgb urine dipstick LARGE (*)    Bacteria, UA RARE (*)    All other components within normal limits  COMPREHENSIVE METABOLIC PANEL - Abnormal; Notable for the following components:   Calcium 8.8 (*)    All other components within normal limits  HCG, QUANTITATIVE, PREGNANCY - Abnormal; Notable for the following components:   hCG, Beta Chain, Quant, S 52 (*)    All other components within normal limits  POC URINE PREG, ED - Abnormal; Notable for the following components:   Preg Test, Ur Positive (*)  All other components within normal limits  CBC WITH DIFFERENTIAL/PLATELET     EKG     RADIOLOGY  I personally viewed, evaluated, and interpreted these images as part of my medical decision making, as well as reviewing the written report by the radiologist.  ED Provider Interpretation: I discussed the results with both radiologist as well as on-call OB/GYN, Dr. Dalbert Garnet.  Patient has no intrauterine pregnancy but does have a 2.2 x 1.6 x 1.6 cm lesion in the left adnexa with peripheral vascularity concerning for ectopic pregnancy.  US OB LESS THAN 14 WEEKS WITH OB TRANSVAGINAL  Result Date: 09/13/2022 CLINICAL DATA:  Pregnant, vaginal bleeding, declining beta HCG (219 on 08/26/2022, currently 52) EXAM: OBSTETRIC <14 WK [place] AND TRANSVAGINAL OB US TECHNIQUE: Both transabdominal and transvaginal ultrasound examinations were performed for complete evaluation  of the gestation as well as the maternal uterus, adnexal regions, and pelvic cul-de-sac. Transvaginal technique was performed to assess early pregnancy. COMPARISON:  08/26/2022 FINDINGS: Intrauterine gestational sac: None Yolk sac:  Not Visualized. Embryo:  Not Visualized. Cardiac Activity: Not Visualized. Maternal uterus/adnexae: Endometrial complex measures 7 mm. Right ovary is within normal limits, noting a 4.0 cm simple corpus luteum. Left ovary is not discretely visualized. However, there is a 2.2 x 1.6 x 1.6 cm soft tissue ring in the left adnexa with associated peripheral vascularity, raising concern for ectopic pregnancy in this clinical setting. Trace pelvic fluid. IMPRESSION: No IUP is visualized. 2.2 cm left adnexal mass, raising concern for ectopic pregnancy in this clinical setting. Critical Value/emergent results were called by telephone at the time of interpretation on 09/13/2022 at 11:02 pm to provider [facility] , who verbally acknowledged these results. Electronically Signed   By: Charline Bills M.D.   On: 09/13/2022 23:05    PROCEDURES:  Critical Care performed: No  Procedures   MEDICATIONS ORDERED IN ED: Medications  oxyCODONE-acetaminophen (PERCOCET/ROXICET) 5-325 MG per tablet 1 tablet (has no administration in time range)     IMPRESSION / MDM / ASSESSMENT AND PLAN / ED COURSE  I reviewed the triage vital signs and the nursing notes.                                 Differential diagnosis includes, but is not limited to, miscarriage, retained products of conception, ectopic pregnancy   Patient's presentation is most consistent with acute presentation with potential threat to life or bodily function.   Patient's diagnosis is consistent with pregnancy of unknown anatomic location, vaginal bleeding.  Patient presented to the emergency department with ongoing vaginal bleeding.  She been seen here 2 and half weeks ago, had a low hCG with vaginal bleeding.  She  followed up in 2 days with an hCG that it dropped by 50% leading to suspicions of miscarriage.  She was to follow-up with OB/GYN, did not follow-up with OB/GYN and has been having ongoing vaginal bleeding.  No significant abdominal pain, no other complaints.  Exam is reassuring.  Was concerned the patient may have retained products of conception given the ongoing bleeding and may require D&C.  Given the positive urine pregnancy test and hCG being positive repeat ultrasound was obtained.  Patient's hCG is only fallen to 52.  Ultrasound was concerning for a possible ectopic pregnancy as there is a lesion with peripheral vascularity concerning for an ectopic in the left adnexa.  Patient's symptoms, falling hCG with ongoing vaginal bleeding provided some confusion  and I discussed the case with on-call OB/GYN, Dr. Dalbert Garnet.  At this time patient is hemodynamically stable, patient's H&H are stable and OB/GYN feels that a [facility] tomorrow with laparoscopic evaluation of this lesion is best procedure.  At this time patient is stable for discharge tonight and will have an outpatient same-day procedure performed tomorrow with OB/GYN.  Patient is aware of her results, her recommendations and will follow-up tomorrow with Dr. Dalbert Garnet.  Return precautions discussed with the patient.  Patient is stable for discharge.Marland Kitchen     FINAL CLINICAL IMPRESSION(S) / ED DIAGNOSES   Final diagnoses:  Vaginal bleeding  Pregnancy of unknown anatomic location     Rx / DC Orders   ED Discharge Orders     None        Note:  This document was prepared using Dragon voice recognition software and may include unintentional dictation errors.   Lanette Hampshire 09/14/22 0051    Willy Eddy, MD 09/15/22 1059
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
## 2022-09-14 NOTE — Op Note (Signed)
Operative Report Suction Dilation and Curettage, diagnostic lap   Indications: Pregnancy of unknown location   Pre-operative Diagnosis: Pregnancy of unknown location, heavy bleeding  Post-operative Diagnosis: left ectopic pregnancy  Procedure: 1. Suction D&C 2. Diagnostic laparoscopy 3. Left salpingectomy with ectopic removal  Surgeon: Christeen Douglas, MD  Assistant(s):  CST  Anesthesia: General LMA anesthesia  Anesthesiologist: Foye Deer, MD Anesthesiologist: Foye Deer, MD CRNA: Jeanine Luz, CRNA  Estimated Blood Loss:  Minimal         Intraoperative medications: Toradol         Total IV Fluids:  Urine Output: 50ml         Specimens: Endometrial curettings, left fallopian tube with ectopic pregnancy         Complications:  None; patient tolerated the procedure well.         Disposition: PACU - hemodynamically stable.         Condition: stable  Findings: Uterus measuring 6 weeks; normal cervix, vagina, perineum. Dilated left fallopian tube with intact ectopic pregnancy. Normal left and right ovaries, with a right ovarian simple appearing cyst. Normal right fallopian tube. Normal upper abdomen. Normal appendix. No hemoperitoneum.  Significant abdominal scar tissue just below the umbilicus and to the right.  Right upper quadrant was visualized, with omental adhesions to the anterior abdominal wall several centimeters below the liver edge.  No pelvic adhesions, except in the deep posterior cul-de-sac, which was not manipulated today.   Indication for procedure/Consents: 27 y.o. [id number]  here for scheduled surgery for the aforementioned diagnoses.   Risks of surgery were discussed with the patient including but not limited to: bleeding which may require transfusion; infection which may require antibiotics; injury to uterus or surrounding organs; intrauterine scarring which may impair future fertility; need for additional procedures  including laparotomy or laparoscopy; and other postoperative/anesthesia complications. Written informed consent was obtained.     Procedure: She was taken to the operating room where general anesthesia was administered and was found to be adequate.  After a formal and adequate timeout was performed, she was placed in the dorsal lithotomy position and examined with the above findings. She was then prepped and draped in the sterile manner.   Her bladder was catheterized for an estimated amount of clear, yellow urine. A speculum was then placed in the patient's vagina and a single tooth tenaculum was applied to the anterior lip of the cervix.  A dilator was placed and secured to the tenaculum.  Attention was turned to the abdomen where a 5mm incision was made at palmers point after confirming OG tube with the scalpel.  The Optiview 5-mm trocar and sleeve were then advanced without difficulty with the laparoscope under direct visualization into the abdomen.  The abdomen was then insufflated with carbon dioxide gas and adequate pneumoperitoneum was obtained.   A detailed survey of the patient's pelvis and abdomen revealed the findings as mentioned above. One additional 5mm trocar was placed in the left lower quadrant under direct visualization.  A 10 mm trocar was placed under direct visualization in the right lower quadrant.  The pelvis was evaluated. Findings as above.  Left fallopian tube was removed using LigaSure cautery.  An Endo Catch bag was placed in the left lower quadrant and the specimen removed intact.  The 10 mm fascial incision was closed using a figure-of-eight using 2-0 Vicryl at the [employer] closure system.  The operative site was surveyed, and it was found to be hemostatic.  No intraoperative injury to surrounding organs was noted.   Pictures were taken of the quadrants and pelvis. The abdomen was desufflated and all instruments were then removed from the patient's abdomen.  All incisions  were closed with 4-0 Vicryl and Dermabond.   Attention was turned to the pelvis, where a gentle D&C was performed.  The uterine curette curettings were sent for specimen.  Foley cath and sponge stick removed.  The patient tolerated the procedure well and was taken to the recovery area awake, extubated and in stable condition.  The patient will be discharged to home as per PACU criteria.  She will receive a dose of oral antibiotics prior to discharge and for 24 hrs Routine postoperative instructions given.  She was prescribed ERAS acetominophen, Ibuprofen and Colace.  She will follow up in the clinic in two weeks for postoperative evaluation.

## 2022-09-14 NOTE — Anesthesia Procedure Notes (Signed)
Procedure Name: Intubation Date/Time: 09/14/2022 3:46 PM  Performed by: Joanette Gula, , CRNAPre-anesthesia Checklist: Patient identified, Emergency Drugs available, Suction available and Patient being monitored Patient Re-evaluated:Patient Re-evaluated prior to induction Oxygen Delivery Method: Circle system utilized Preoxygenation: Pre-oxygenation with 100% oxygen Induction Type: IV induction Ventilation: Mask ventilation without difficulty Laryngoscope Size: McGraph and 3 Grade View: Grade I Tube type: Oral Tube size: 6.5 mm Number of attempts: 1 Airway Equipment and Method: Stylet Placement Confirmation: ETT inserted through vocal cords under direct vision, positive ETCO2 and breath sounds checked- equal and bilateral Secured at: 19 cm Tube secured with: Tape Dental Injury: Teeth and Oropharynx as per pre-operative assessment

## 2022-09-14 NOTE — Transfer of Care (Signed)
Immediate Anesthesia Transfer of Care Note  Patient: Tiffany Andrews  Procedure(s) Performed: DIAGNOSTIC LAPAROSCOPY WITH REMOVAL OF ECTOPIC PREGNANCY, LEFT SALPINGECTOMY (Left) DILATATION AND CURETTAGE  Patient Location: PACU  Anesthesia Type:General  Level of Consciousness: awake, alert , and oriented  Airway & Oxygen Therapy: Patient Spontanous Breathing and Patient connected to face mask oxygen  Post-op Assessment: Report given to RN and Post -op Vital signs reviewed and stable  Post vital signs: Reviewed and stable  Last Vitals:  Vitals Value Taken Time  BP 113/63 09/14/22 1719  Temp    Pulse 63 09/14/22 1720  Resp 15 09/14/22 1720  SpO2 100 % 09/14/22 1720  Vitals shown include unfiled device data.  Last Pain:  Vitals:   09/14/22 1317  TempSrc: Temporal  PainSc: 5          Complications: No notable events documented.

## 2022-09-15 ENCOUNTER — Encounter: Payer: Self-pay | Admitting: Obstetrics and Gynecology

## 2022-09-15 NOTE — Anesthesia Postprocedure Evaluation (Signed)
Anesthesia Post Note  Patient: Tiffany Andrews  Procedure(s) Performed: DIAGNOSTIC LAPAROSCOPY WITH REMOVAL OF ECTOPIC PREGNANCY, LEFT SALPINGECTOMY (Left) DILATATION AND CURETTAGE  Patient location during evaluation: PACU Anesthesia Type: General Level of consciousness: awake and alert Pain management: pain level controlled Vital Signs Assessment: post-procedure vital signs reviewed and stable Respiratory status: spontaneous breathing, nonlabored ventilation and respiratory function stable Cardiovascular status: blood pressure returned to baseline and stable Postop Assessment: no apparent nausea or vomiting Anesthetic complications: no   No notable events documented.   Last Vitals:  Vitals:   09/14/22 1856 09/14/22 1859  BP: 110/66 110/66  Pulse:  (!) 52  Resp: 16   Temp: (!) 36.3 C   SpO2: 100% 100%    Last Pain:  Vitals:   09/14/22 1856  TempSrc:   PainSc: 4                  Foye Deer

## 2022-11-17 ENCOUNTER — Emergency Department
Admission: EM | Admit: 2022-11-17 | Discharge: 2022-11-17 | Disposition: A | Discharge status: Home / Self Care | Payer: Medicaid Other | Attending: Emergency Medicine | Admitting: Emergency Medicine

## 2022-11-17 ENCOUNTER — Other Ambulatory Visit: Payer: Self-pay

## 2022-11-17 DIAGNOSIS — X501XXA Overexertion from prolonged static or awkward postures, initial encounter: Secondary | ICD-10-CM | POA: Insufficient documentation

## 2022-11-17 DIAGNOSIS — R112 Nausea with vomiting, unspecified: Secondary | ICD-10-CM | POA: Diagnosis not present

## 2022-11-17 DIAGNOSIS — R0781 Pleurodynia: Secondary | ICD-10-CM | POA: Diagnosis not present

## 2022-11-17 DIAGNOSIS — Z3A08 8 weeks gestation of pregnancy: Secondary | ICD-10-CM | POA: Insufficient documentation

## 2022-11-17 DIAGNOSIS — O26891 Other specified pregnancy related conditions, first trimester: Secondary | ICD-10-CM | POA: Diagnosis present

## 2022-11-17 MED ORDER — LIDOCAINE 5 % EX PTCH: 1.0000 | MEDICATED_PATCH | Freq: Two times a day (BID) | CUTANEOUS | 0 refills | Status: DC

## 2022-11-17 MED ORDER — LIDOCAINE 5 % EX PTCH
1.0000 | MEDICATED_PATCH | CUTANEOUS | Status: DC
  Administered 2022-11-17: 1 via TRANSDERMAL
  Filled 2022-11-17: qty 1

## 2022-11-17 NOTE — Discharge Instructions (Addendum)
You can take 650 mg of Tylenol every 6 hours as needed.  I have also sent some lidocaine patches to the pharmacy, you can apply these every 12 hours.  Follow-up with your OB as needed.

## 2022-11-17 NOTE — ED Provider Notes (Signed)
[facility] Provider Note    Event Date/Time   First MD Initiated Contact with Patient 11/17/22 2012     (approximate)   History   Rib Injury   HPI   Tiffany Andrews is a 27 y.o. female who is 8 weeks and 5 days pregnant presents for evaluation of right-sided rib pain.  Patient states that she has had a lot of nausea and vomiting in her first trimester, this morning when she was vomiting she felt a pop.  Her pain is about a 5 out of 10 at baseline but when she takes a deep breath or tries to move around her pain becomes sharp and increases.  She denies any chest pain and difficulty breathing.  She has taken Tylenol at home to manage her pain.     Physical Exam   Triage Vital Signs: ED Triage Vitals  Encounter Vitals Group     BP 11/17/22 1911 123/78     Systolic BP Percentile --      Diastolic BP Percentile --      Pulse Rate 11/17/22 1911 91     Resp 11/17/22 1911 19     Temp 11/17/22 1911 98.4 F (36.9 C)     Temp src --      SpO2 11/17/22 1911 100 %     Weight 11/17/22 1912 223 lb (101.2 kg)     Height 11/17/22 1912 5\' 8"  (1.727 m)     Head Circumference --      Peak Flow --      Pain Score 11/17/22 1912 5     Pain Loc --      Pain Education --      Exclude from Growth Chart --     Most recent vital signs: Vitals:   11/17/22 1911  BP: 123/78  Pulse: 91  Resp: 19  Temp: 98.4 F (36.9 C)  SpO2: 100%    General: Awake, no distress.  CV:  Good peripheral perfusion.  RRR. Resp:  Normal effort.  CTAB. Abd:  No distention.  Tender to palpation along the right rib, no overlying skin changes or swelling.   ED Results / Procedures / Treatments   Labs (all labs ordered are listed, but only abnormal results are displayed) Labs Reviewed - No data to display   PROCEDURES:  Critical Care performed: No  Procedures   MEDICATIONS ORDERED IN ED: Medications  lidocaine (LIDODERM) 5 % 1 patch (has no administration in time range)      IMPRESSION / MDM / ASSESSMENT AND PLAN / ED COURSE  I reviewed the triage vital signs and the nursing notes.                             27 year old female presents for evaluation of right-sided rib pain after vomiting this morning.  Vital signs stable in triage patient NAD on exam.  Differential diagnosis includes, but is not limited to, rib fracture, rib dislocation, costochondritis, pulled muscle.  Patient's presentation is most consistent with acute, uncomplicated illness.  I explained to the patient that since she is pregnant I cannot get rib x-rays.  I further explained that even if she did have a rib fracture this is not something that would require any intervention.  I suspect that she most likely pulled a muscle and advised her to continue taking Tylenol for her pain.  I also will send her some lidocaine patches as  well.  She just wanted to make sure that her baby was okay.  She is not having any bleeding or pelvic pain.  I said that we could try to listen to fetal heart tones but it is likely a little early to hear them.  She did not want to attempt this and hear her baby's heartbeat for the first time without her partner present.  She has an appointment with her OB in 2 weeks which I advised her to keep.  She can return to the ED with any new or worsening symptoms.  She voiced understanding, all questions were answered and she was stable at discharge.    FINAL CLINICAL IMPRESSION(S) / ED DIAGNOSES   Final diagnoses:  Rib pain on right side     Rx / DC Orders   ED Discharge Orders          Ordered    lidocaine (LIDODERM) 5 %  Every 12 hours        11/17/22 2131             Note:  This document was prepared using Dragon voice recognition software and may include unintentional dictation errors.   Cameron Ali, PA-C 11/17/22 2132    Dionne Bucy, MD 11/17/22 2333

## 2022-11-17 NOTE — ED Triage Notes (Addendum)
Pt to ED via POV c/o right rib pain. Pt reports feeling a "pop" while she was throwing up today from morning sickness. Pain is dull but becomes sharp whenever she takes a deep breath or stretches. Pt reports she is around [redacted] weeks pregnant. Denies CP, SOB, fevers, dizziness

## 2022-12-05 ENCOUNTER — Other Ambulatory Visit: Payer: Self-pay

## 2022-12-05 DIAGNOSIS — O99411 Diseases of the circulatory system complicating pregnancy, first trimester: Secondary | ICD-10-CM | POA: Diagnosis not present

## 2022-12-05 DIAGNOSIS — O0991 Supervision of high risk pregnancy, unspecified, first trimester: Secondary | ICD-10-CM | POA: Insufficient documentation

## 2022-12-05 DIAGNOSIS — O210 Mild hyperemesis gravidarum: Secondary | ICD-10-CM | POA: Diagnosis not present

## 2022-12-05 DIAGNOSIS — O219 Vomiting of pregnancy, unspecified: Secondary | ICD-10-CM | POA: Diagnosis present

## 2022-12-05 DIAGNOSIS — O0993 Supervision of high risk pregnancy, unspecified, third trimester: Secondary | ICD-10-CM | POA: Insufficient documentation

## 2022-12-05 DIAGNOSIS — R0789 Other chest pain: Secondary | ICD-10-CM | POA: Insufficient documentation

## 2022-12-05 DIAGNOSIS — Z3A11 11 weeks gestation of pregnancy: Secondary | ICD-10-CM | POA: Diagnosis not present

## 2022-12-05 LAB — CBC
HCT: 42 % (ref 36.0–46.0)
Hemoglobin: 14.6 g/dL (ref 12.0–15.0)
MCH: 29.1 pg (ref 26.0–34.0)
MCHC: 34.8 g/dL (ref 30.0–36.0)
MCV: 83.7 fL (ref 80.0–100.0)
Platelets: 254 10*3/uL (ref 150–400)
RBC: 5.02 MIL/uL (ref 3.87–5.11)
RDW: 12.4 % (ref 11.5–15.5)
WBC: 11.2 10*3/uL — ABNORMAL HIGH (ref 4.0–10.5)
nRBC: 0 % (ref 0.0–0.2)

## 2022-12-05 LAB — HEPATIC FUNCTION PANEL
ALT: 12 U/L (ref 0–44)
AST: 15 U/L (ref 15–41)
Albumin: 4.3 g/dL (ref 3.5–5.0)
Alkaline Phosphatase: 49 U/L (ref 38–126)
Bilirubin, Direct: 0.2 mg/dL (ref 0.0–0.2)
Indirect Bilirubin: 1.1 mg/dL — ABNORMAL HIGH (ref 0.3–0.9)
Total Bilirubin: 1.3 mg/dL — ABNORMAL HIGH (ref 0.3–1.2)
Total Protein: 7.9 g/dL (ref 6.5–8.1)

## 2022-12-05 LAB — BASIC METABOLIC PANEL
Anion gap: 10 (ref 5–15)
BUN: <5 mg/dL — ABNORMAL LOW (ref 6–20)
CO2: 24 mmol/L (ref 22–32)
Calcium: 9.2 mg/dL (ref 8.9–10.3)
Chloride: 102 mmol/L (ref 98–111)
Creatinine, Ser: 0.6 mg/dL (ref 0.44–1.00)
GFR, Estimated: >60 mL/min (ref >60)
Glucose, Bld: 98 mg/dL (ref 70–99)
Potassium: 3.2 mmol/L — ABNORMAL LOW (ref 3.5–5.1)
Sodium: 136 mmol/L (ref 135–145)

## 2022-12-05 LAB — LIPASE, BLOOD: Lipase: 31 U/L (ref 11–51)

## 2022-12-05 LAB — TROPONIN I (HIGH SENSITIVITY): Troponin I (High Sensitivity): 4 ng/L (ref <18)

## 2022-12-05 MED ORDER — DIPHENHYDRAMINE HCL 50 MG/ML IJ SOLN
50.0000 mg | Freq: Once | INTRAMUSCULAR | Status: AC
  Administered 2022-12-06: 50 mg via INTRAVENOUS
  Filled 2022-12-05: qty 1

## 2022-12-05 MED ORDER — METHYLPREDNISOLONE SODIUM SUCC 40 MG IJ SOLR
40.0000 mg | Freq: Once | INTRAMUSCULAR | Status: AC
  Administered 2022-12-05: 40 mg via INTRAVENOUS
  Filled 2022-12-05: qty 1

## 2022-12-05 MED ORDER — DIPHENHYDRAMINE HCL 25 MG PO CAPS: 50.0000 mg | ORAL_CAPSULE | Freq: Once | ORAL | Status: AC

## 2022-12-05 NOTE — ED Triage Notes (Addendum)
Pt to ED via POV c/o chest pain and vomiting. Pt reports mid chest pressure started around 10pm tonight, pain radiates towards left chest to shoulder. Pt also has been vomiting x3weeks due to morning sickness, reports seeing blood in vomit. Pt is [redacted] weeks pregnant

## 2022-12-06 ENCOUNTER — Emergency Department: Payer: Medicaid Other

## 2022-12-06 ENCOUNTER — Emergency Department
Admission: EM | Admit: 2022-12-06 | Discharge: 2022-12-06 | Disposition: A | Discharge status: Home / Self Care | Payer: Medicaid Other | Attending: Emergency Medicine | Admitting: Emergency Medicine

## 2022-12-06 DIAGNOSIS — O200 Threatened abortion: Secondary | ICD-10-CM

## 2022-12-06 DIAGNOSIS — R0789 Other chest pain: Secondary | ICD-10-CM

## 2022-12-06 DIAGNOSIS — O210 Mild hyperemesis gravidarum: Secondary | ICD-10-CM

## 2022-12-06 LAB — URINALYSIS, ROUTINE W REFLEX MICROSCOPIC
Bacteria, UA: NONE SEEN
Bilirubin Urine: NEGATIVE
Glucose, UA: NEGATIVE mg/dL
Hgb urine dipstick: NEGATIVE
Ketones, ur: 80 mg/dL — AB
Nitrite: NEGATIVE
Protein, ur: 30 mg/dL — AB
Specific Gravity, Urine: 1.027 (ref 1.005–1.030)
pH: 6 (ref 5.0–8.0)

## 2022-12-06 LAB — TROPONIN I (HIGH SENSITIVITY): Troponin I (High Sensitivity): 3 ng/L (ref <18)

## 2022-12-06 LAB — HCG, QUANTITATIVE, PREGNANCY: hCG, Beta Chain, Quant, S: 44385 m[IU]/mL — ABNORMAL HIGH (ref <5)

## 2022-12-06 MED ORDER — IOHEXOL 350 MG/ML SOLN
75.0000 mL | Freq: Once | INTRAVENOUS | Status: AC | PRN
  Administered 2022-12-06: 75 mL via INTRAVENOUS

## 2022-12-06 MED ORDER — LIDOCAINE VISCOUS HCL 2 % MT SOLN
15.0000 mL | Freq: Once | OROMUCOSAL | Status: AC
  Administered 2022-12-06: 15 mL via OROMUCOSAL
  Filled 2022-12-06: qty 15

## 2022-12-06 MED ORDER — ONDANSETRON 4 MG PO TBDP: 4.0000 mg | ORAL_TABLET | Freq: Three times a day (TID) | ORAL | 0 refills | Status: DC | PRN

## 2022-12-06 MED ORDER — ALUM & MAG HYDROXIDE-SIMETH 200-200-20 MG/5ML PO SUSP
30.0000 mL | Freq: Once | ORAL | Status: AC
  Administered 2022-12-06: 30 mL via ORAL
  Filled 2022-12-06: qty 30

## 2022-12-06 MED ORDER — ONDANSETRON HCL 4 MG/2ML IJ SOLN
4.0000 mg | Freq: Once | INTRAMUSCULAR | Status: AC
  Administered 2022-12-06: 4 mg via INTRAVENOUS
  Filled 2022-12-06: qty 2

## 2022-12-06 MED ORDER — DEXTROSE IN LACTATED RINGERS 5 % IV SOLN
1000.0000 mL | Freq: Once | INTRAVENOUS | Status: AC
  Administered 2022-12-06: 1000 mL via INTRAVENOUS

## 2022-12-06 MED ORDER — POTASSIUM CHLORIDE CRYS ER 20 MEQ PO TBCR
40.0000 meq | EXTENDED_RELEASE_TABLET | Freq: Once | ORAL | Status: AC
  Administered 2022-12-06: 40 meq via ORAL
  Filled 2022-12-06: qty 2

## 2022-12-06 NOTE — Discharge Instructions (Addendum)
Currently your testing in the emergency department today showed a single live intrauterine pregnancy.  Early in pregnancy, vaginal bleeding may occur and resolve and you may continue to have a completely normal pregnancy.  It is important that you follow-up with your gynecology closely.  Take Zofran for nausea as needed.  Thank you for choosing [place] for your health care today!  Please see your primary doctor this week for a follow up appointment.   If you have any new, worsening, or unexpected symptoms call your doctor right away or come back to the emergency department for reevaluation.  It was my pleasure to care for you today.   Daneil Dan Modesto Charon, MD

## 2022-12-06 NOTE — ED Provider Notes (Signed)
Transvaginal ultrasound reviewed, normal, patient stable for discharge.  Will follow-up with gynecology.   Pilar Jarvis, MD 12/06/22 [phone]

## 2022-12-06 NOTE — ED Notes (Signed)
While this RN was discharging pt - she used the bathroom and informed this RN that she is now having vaginal bleeding, denies cramping. This RN informed provider. Provider to order ultrasound.

## 2022-12-06 NOTE — ED Provider Notes (Signed)
[facility] Provider Note    Event Date/Time   First MD Initiated Contact with Patient 12/06/22 0154     (approximate)   History   Chest Pain and Emesis   HPI  Tiffany Andrews is a 27 y.o. female who presents to the ED for evaluation of Chest Pain and Emesis   I reviewed operative note from August, ectopic pregnancy requiring laparoscopy, left salpingectomy and suction D&C.  Patient presents to the ED for evaluation of 3-week of throwing up throughout this gestation.  She reports being about [redacted] weeks pregnant and has had issues with severe morning sickness throughout this gestation.  She reports recurrent emesis and concerns for dehydration.  She reports some of the latter episodes of emesis had some blood/red streaking within it, no frank blood or clots.  Reports developing some burning chest discomfort alongside this  No fevers, abdominal pain or vaginal bleeding.  No urinary symptoms such as dysuria, frequency   Physical Exam   Triage Vital Signs: ED Triage Vitals  Encounter Vitals Group     BP 12/05/22 2313 136/82     Systolic BP Percentile --      Diastolic BP Percentile --      Pulse Rate 12/05/22 2313 85     Resp 12/05/22 2313 18     Temp 12/05/22 2313 98.5 F (36.9 C)     Temp Source 12/05/22 2313 Oral     SpO2 12/05/22 2313 97 %     Weight 12/05/22 2304 209 lb (94.8 kg)     Height 12/05/22 2304 5\' 6"  (1.676 m)     Head Circumference --      Peak Flow --      Pain Score 12/05/22 2303 7     Pain Loc --      Pain Education --      Exclude from Growth Chart --     Most recent vital signs: Vitals:   12/06/22 0430 12/06/22 0600  BP: 102/66 108/75  Pulse: (!) 55 67  Resp: 15 (!) 21  Temp:    SpO2: 100% 99%    General: Awake, no distress.  Seems dry but pleasant and conversational.  Looks well. CV:  Good peripheral perfusion.  Resp:  Normal effort.  Abd:  No distention.  Soft and benign MSK:  No deformity noted.  Neuro:  No  focal deficits appreciated. Other:     ED Results / Procedures / Treatments   Labs (all labs ordered are listed, but only abnormal results are displayed) Labs Reviewed  BASIC METABOLIC PANEL - Abnormal; Notable for the following components:      Result Value   Potassium 3.2 (*)    BUN <5 (*)    All other components within normal limits  CBC - Abnormal; Notable for the following components:   WBC 11.2 (*)    All other components within normal limits  HEPATIC FUNCTION PANEL - Abnormal; Notable for the following components:   Total Bilirubin 1.3 (*)    Indirect Bilirubin 1.1 (*)    All other components within normal limits  HCG, QUANTITATIVE, PREGNANCY - Abnormal; Notable for the following components:   hCG, Beta Chain, Quant, S [id number] (*)    All other components within normal limits  URINALYSIS, ROUTINE W REFLEX MICROSCOPIC - Abnormal; Notable for the following components:   Color, Urine AMBER (*)    APPearance CLOUDY (*)    Ketones, ur 80 (*)    Protein, ur  30 (*)    Leukocytes,Ua MODERATE (*)    All other components within normal limits  URINE CULTURE  LIPASE, BLOOD  POC URINE PREG, ED  TROPONIN I (HIGH SENSITIVITY)  TROPONIN I (HIGH SENSITIVITY)    EKG Sinus rhythm with a rate of 104 bpm.  Normal axis and intervals.  Slight ST elevations aVR and diffuse depressions of a similar morphology as previous but more pronounced depressions  RADIOLOGY CTA chest interpreted by me without evidence of PE  Official radiology report(s): CT Angio Chest PE W and/or Wo Contrast  Result Date: 12/06/2022 CLINICAL DATA:  First trimester gravid female, chest pain and tachycardia. Possible pulmonary embolism. EXAM: CT ANGIOGRAPHY CHEST WITH CONTRAST TECHNIQUE: Multidetector CT imaging of the chest was performed using the standard protocol during bolus administration of intravenous contrast. Multiplanar CT image reconstructions and MIPs were obtained to evaluate the vascular anatomy.  RADIATION DOSE REDUCTION: This exam was performed according to the departmental dose-optimization program which includes automated exposure control, adjustment of the mA and/or kV according to patient size and/or use of iterative reconstruction technique. CONTRAST:  75mL OMNIPAQUE IOHEXOL 350 MG/ML SOLN COMPARISON:  Prior CT scan of the chest 07/17/2021 FINDINGS: Cardiovascular: Satisfactory opacification of the pulmonary arteries to the segmental level. No evidence of pulmonary embolism. Normal heart size. No pericardial effusion. Mediastinum/Nodes: No enlarged mediastinal, hilar, or axillary lymph nodes. Thyroid gland, trachea, and esophagus demonstrate no significant findings. Lungs/Pleura: Lungs are clear. No pleural effusion or pneumothorax. Minimal dependent atelectasis. Upper Abdomen: No acute abnormality in the visualized upper abdomen. Surgical changes of prior cholecystectomy. Musculoskeletal: No chest wall abnormality. No acute or significant osseous findings. Review of the MIP images confirms the above findings. IMPRESSION: 1. Negative for acute pulmonary embolus, pneumonia or other acute cardiopulmonary process. 2. Surgical changes of prior cholecystectomy. Electronically Signed   By: Malachy Moan M.D.   On: 12/06/2022 05:22    PROCEDURES and INTERVENTIONS:  .1-3 Lead EKG Interpretation  Performed by: Delton Prairie, MD Authorized by: Delton Prairie, MD     Interpretation: normal     ECG rate:  80   ECG rate assessment: normal     Rhythm: sinus rhythm     Ectopy: none     Conduction: normal     Medications  methylPREDNISolone sodium succinate (SOLU-MEDROL) 40 mg/mL injection 40 mg (40 mg Intravenous Given 12/05/22 2319)  diphenhydrAMINE (BENADRYL) capsule 50 mg ( Oral See Alternative 12/06/22 0222)    Or  diphenhydrAMINE (BENADRYL) injection 50 mg (50 mg Intravenous Given 12/06/22 0222)  potassium chloride SA (KLOR-CON M) CR tablet 40 mEq (40 mEq Oral Given 12/06/22 0204)   ondansetron (ZOFRAN) injection 4 mg (4 mg Intravenous Given 12/06/22 0202)  iohexol (OMNIPAQUE) 350 MG/ML injection 75 mL (75 mLs Intravenous Contrast Given 12/06/22 0309)  dextrose 5 % in lactated ringers infusion (0 mLs Intravenous Stopped 12/06/22 0530)  alum & mag hydroxide-simeth (MAALOX/MYLANTA) 200-200-20 MG/5ML suspension 30 mL (30 mLs Oral Given 12/06/22 0427)  lidocaine (XYLOCAINE) 2 % viscous mouth solution 15 mL (15 mLs Mouth/Throat Given 12/06/22 0427)     IMPRESSION / MDM / ASSESSMENT AND PLAN / ED COURSE  I reviewed the triage vital signs and the nursing notes.  Differential diagnosis includes, but is not limited to, ACS, PTX, PNA, muscle strain/spasm, PE, dissection, anxiety, pleural effusion  {Patient presents with symptoms of an acute illness or injury that is potentially life-threatening.  For trimester pregnant patient presents with evidence of hyperemesis gravidarum.  Atypical chest pain  without evidence of cardiac ischemia or PE.  Negative troponins and CTA chest.  Hypokalemia is replaced orally.  Essentially normal CBC.  Normal lipase.  Urine with many ketones suggestive of dehydration and moderate leukocytes but no bacteria.  She has no urinary symptoms.  Will send this for culture but abstain from antibiotics at this point.  As below, around the time of discharge after the benign workup for her chest discomfort and subsequently tolerating p.o. intake, she does have vaginal bleeding and some mild lower abdominal cramping.  She is signed out to oncoming morning physician to follow-up on a transvaginal ultrasound  Clinical Course as of 12/06/22 0715  Tue Dec 06, 2022  0417 I reassess and perform obstetric Doppler, fetal heart rate is rapid, I estimate around 180 bpm [DS]  0609 Reassessed, feeling better and ready to go.  Has to get her other children ready for school.  We discussed return precautions. [DS]  [phone] At the time of discharge as patient was getting dressed  and stood up she had painless vaginal bleeding.  I reassessed.  She has some very mild cramping.  She is Rh+. [DS]    Clinical Course User Index [DS] Delton Prairie, MD     FINAL CLINICAL IMPRESSION(S) / ED DIAGNOSES   Final diagnoses:  Hyperemesis gravidarum  Other chest pain     Rx / DC Orders   ED Discharge Orders     None        Note:  This document was prepared using Dragon voice recognition software and may include unintentional dictation errors.   Delton Prairie, MD 12/06/22 [phone]

## 2022-12-07 LAB — OB RESULTS CONSOLE VARICELLA ZOSTER ANTIBODY, IGG: Varicella: IMMUNE

## 2022-12-07 LAB — URINE CULTURE

## 2022-12-07 LAB — HEPATITIS C ANTIBODY: HCV Ab: NEGATIVE

## 2022-12-07 LAB — OB RESULTS CONSOLE HEPATITIS B SURFACE ANTIGEN: Hepatitis B Surface Ag: NEGATIVE

## 2022-12-07 LAB — OB RESULTS CONSOLE RUBELLA ANTIBODY, IGM: Rubella: NON-IMMUNE/NOT IMMUNE

## 2022-12-07 LAB — OB RESULTS CONSOLE HIV ANTIBODY (ROUTINE TESTING): HIV: NONREACTIVE

## 2022-12-13 ENCOUNTER — Other Ambulatory Visit: Payer: Self-pay | Admitting: Certified Nurse Midwife

## 2022-12-13 DIAGNOSIS — O0991 Supervision of high risk pregnancy, unspecified, first trimester: Secondary | ICD-10-CM

## 2022-12-22 ENCOUNTER — Encounter: Payer: Self-pay | Admitting: Emergency Medicine

## 2022-12-22 ENCOUNTER — Emergency Department: Payer: Medicaid Other

## 2022-12-22 ENCOUNTER — Emergency Department
Admission: EM | Admit: 2022-12-22 | Discharge: 2022-12-22 | Disposition: A | Discharge status: Home / Self Care | Payer: Medicaid Other | Attending: Emergency Medicine | Admitting: Emergency Medicine

## 2022-12-22 ENCOUNTER — Other Ambulatory Visit: Payer: Self-pay

## 2022-12-22 DIAGNOSIS — O219 Vomiting of pregnancy, unspecified: Secondary | ICD-10-CM | POA: Diagnosis present

## 2022-12-22 DIAGNOSIS — R7309 Other abnormal glucose: Secondary | ICD-10-CM | POA: Insufficient documentation

## 2022-12-22 DIAGNOSIS — Z3A13 13 weeks gestation of pregnancy: Secondary | ICD-10-CM | POA: Insufficient documentation

## 2022-12-22 LAB — URINALYSIS, ROUTINE W REFLEX MICROSCOPIC
Bacteria, UA: NONE SEEN
Bilirubin Urine: NEGATIVE
Glucose, UA: NEGATIVE mg/dL
Hgb urine dipstick: NEGATIVE
Ketones, ur: 20 mg/dL — AB
Nitrite: NEGATIVE
Protein, ur: 30 mg/dL — AB
Specific Gravity, Urine: 1.03 (ref 1.005–1.030)
pH: 5 (ref 5.0–8.0)

## 2022-12-22 LAB — CBC WITH DIFFERENTIAL/PLATELET
Abs Immature Granulocytes: 0.04 10*3/uL (ref 0.00–0.07)
Basophils Absolute: 0 10*3/uL (ref 0.0–0.1)
Basophils Relative: 0 %
Eosinophils Absolute: 0.1 10*3/uL (ref 0.0–0.5)
Eosinophils Relative: 1 %
HCT: 40.1 % (ref 36.0–46.0)
Hemoglobin: 14.1 g/dL (ref 12.0–15.0)
Immature Granulocytes: 0 %
Lymphocytes Relative: 23 %
Lymphs Abs: 2.5 10*3/uL (ref 0.7–4.0)
MCH: 29.7 pg (ref 26.0–34.0)
MCHC: 35.2 g/dL (ref 30.0–36.0)
MCV: 84.4 fL (ref 80.0–100.0)
Monocytes Absolute: 0.5 10*3/uL (ref 0.1–1.0)
Monocytes Relative: 5 %
Neutro Abs: 7.5 10*3/uL (ref 1.7–7.7)
Neutrophils Relative %: 71 %
Platelets: 218 10*3/uL (ref 150–400)
RBC: 4.75 MIL/uL (ref 3.87–5.11)
RDW: 12.5 % (ref 11.5–15.5)
WBC: 10.7 10*3/uL — ABNORMAL HIGH (ref 4.0–10.5)
nRBC: 0 % (ref 0.0–0.2)

## 2022-12-22 LAB — COMPREHENSIVE METABOLIC PANEL
ALT: 118 U/L — ABNORMAL HIGH (ref 0–44)
AST: 95 U/L — ABNORMAL HIGH (ref 15–41)
Albumin: 3.7 g/dL (ref 3.5–5.0)
Alkaline Phosphatase: 54 U/L (ref 38–126)
Anion gap: 8 (ref 5–15)
BUN: <5 mg/dL — ABNORMAL LOW (ref 6–20)
CO2: 23 mmol/L (ref 22–32)
Calcium: 9 mg/dL (ref 8.9–10.3)
Chloride: 104 mmol/L (ref 98–111)
Creatinine, Ser: 0.49 mg/dL (ref 0.44–1.00)
GFR, Estimated: >60 mL/min (ref >60)
Glucose, Bld: 113 mg/dL — ABNORMAL HIGH (ref 70–99)
Potassium: 3.5 mmol/L (ref 3.5–5.1)
Sodium: 135 mmol/L (ref 135–145)
Total Bilirubin: 1.1 mg/dL (ref <1.2)
Total Protein: 7.4 g/dL (ref 6.5–8.1)

## 2022-12-22 LAB — CBG MONITORING, ED: Glucose-Capillary: 109 mg/dL — ABNORMAL HIGH (ref 70–99)

## 2022-12-22 LAB — HCG, QUANTITATIVE, PREGNANCY: hCG, Beta Chain, Quant, S: 38446 m[IU]/mL — ABNORMAL HIGH (ref <5)

## 2022-12-22 LAB — LIPASE, BLOOD: Lipase: 33 U/L (ref 11–51)

## 2022-12-22 MED ORDER — SODIUM CHLORIDE 0.9 % IV BOLUS (SEPSIS)
1000.0000 mL | Freq: Once | INTRAVENOUS | Status: AC
  Administered 2022-12-22: 1000 mL via INTRAVENOUS

## 2022-12-22 MED ORDER — MAALOX MAX 400-400-40 MG/5ML PO SUSP: 10.0000 mL | Freq: Four times a day (QID) | ORAL | 0 refills | Status: DC | PRN

## 2022-12-22 MED ORDER — METOCLOPRAMIDE HCL 5 MG/ML IJ SOLN
10.0000 mg | Freq: Once | INTRAMUSCULAR | Status: AC
  Administered 2022-12-22: 10 mg via INTRAVENOUS
  Filled 2022-12-22: qty 2

## 2022-12-22 MED ORDER — FAMOTIDINE 20 MG PO TABS: 20.0000 mg | ORAL_TABLET | Freq: Two times a day (BID) | ORAL | 1 refills | Status: DC

## 2022-12-22 MED ORDER — SUCRALFATE 1 GM/10ML PO SUSP
1.0000 g | Freq: Four times a day (QID) | ORAL | 1 refills | Status: AC
Start: 2022-12-22 — End: 2023-12-22

## 2022-12-22 NOTE — ED Provider Notes (Signed)
[facility] Provider Note    Event Date/Time   First MD Initiated Contact with Patient 12/22/22 [phone]     (approximate)   History   Emesis   HPI  Tiffany Andrews is a 27 y.o. female   Past medical history of hyperemesis gravidarum, presents to the emergency department with nausea and vomiting during pregnancy.  Approximately [redacted] weeks pregnant.  Has been taking antiemetic without much improvement of her nausea.  Denies abdominal pain.  No vaginal bleeding.  No urinary symptoms.  She is status postcholecystectomy.  She has occasional right lower quadrant discomfort associated with known ovarian cyst but none currently.   External Medical Documents Reviewed: Emergency department visit in October 2024 for hyperemesis gravidarum.      Physical Exam   Triage Vital Signs: ED Triage Vitals  Encounter Vitals Group     BP 12/22/22 0040 113/66     Systolic BP Percentile --      Diastolic BP Percentile --      Pulse Rate 12/22/22 0040 86     Resp 12/22/22 0040 18     Temp 12/22/22 0040 97.7 F (36.5 C)     Temp Source 12/22/22 0040 Oral     SpO2 12/22/22 0040 99 %     Weight 12/22/22 0042 209 lb (94.8 kg)     Height 12/22/22 0042 5\' 6"  (1.676 m)     Head Circumference --      Peak Flow --      Pain Score 12/22/22 0042 8     Pain Loc --      Pain Education --      Exclude from Growth Chart --     Most recent vital signs: Vitals:   12/22/22 0040  BP: 113/66  Pulse: 86  Resp: 18  Temp: 97.7 F (36.5 C)  SpO2: 99%    General: Awake, no distress.  CV:  Good peripheral perfusion.  Resp:  Normal effort.  Abd:  No distention.  Other:  Comfortable appearing normal vital signs appears euvolemic soft nontender abdomen to deep palpation in all quadrants.   ED Results / Procedures / Treatments   Labs (all labs ordered are listed, but only abnormal results are displayed) Labs Reviewed  CBC WITH DIFFERENTIAL/PLATELET - Abnormal; Notable for the  following components:      Result Value   WBC 10.7 (*)    All other components within normal limits  COMPREHENSIVE METABOLIC PANEL - Abnormal; Notable for the following components:   Glucose, Bld 113 (*)    BUN <5 (*)    AST 95 (*)    ALT 118 (*)    All other components within normal limits  HCG, QUANTITATIVE, PREGNANCY - Abnormal; Notable for the following components:   hCG, Beta Chain, Quant, S 38,446 (*)    All other components within normal limits  URINALYSIS, ROUTINE W REFLEX MICROSCOPIC - Abnormal; Notable for the following components:   Color, Urine YELLOW (*)    APPearance HAZY (*)    Ketones, ur 20 (*)    Protein, ur 30 (*)    Leukocytes,Ua TRACE (*)    All other components within normal limits  CBG MONITORING, ED - Abnormal; Notable for the following components:   Glucose-Capillary 109 (*)    All other components within normal limits  LIPASE, BLOOD     I ordered and reviewed the above labs they are notable for she has ketones in the urine   RADIOLOGY  I independently reviewed and interpreted pelvic ultrasound and see single IUP I also reviewed radiologist's formal read.   PROCEDURES:  Critical Care performed: No  Procedures   MEDICATIONS ORDERED IN ED: Medications  sodium chloride 0.9 % bolus 1,000 mL (0 mLs Intravenous Stopped 12/22/22 0423)  metoCLOPramide (REGLAN) injection 10 mg (10 mg Intravenous Given 12/22/22 0223)    IMPRESSION / MDM / ASSESSMENT AND PLAN / ED COURSE  I reviewed the triage vital signs and the nursing notes.                                Patient's presentation is most consistent with acute presentation with potential threat to life or bodily function.  Differential diagnosis includes, but is not limited to, hyperemesis gravidarum, electrolyte derangements, dehydration, intra-abdominal infection, choledocholithiasis, appendicitis   The patient is on the cardiac monitor to evaluate for evidence of arrhythmia and/or significant  heart rate changes.  MDM:    Nausea and vomiting in pregnancy, recurrent for this patient, not tolerating p.o. intake at home despite antiemetic therapy.  She got a fluid bolus and IV antiemetic after which she feels better, tolerating p.o. in the emergency department.  Soft benign abdominal exam rules against surgical abdominal pathologies like appendicitis.  History of cholecystectomy, with right upper quadrant ultrasound showing normal CBD doubt choledocholithiasis.   No vaginal bleeding, normal-appearing pelvic ultrasound with single IUP noted.  Given symptoms well-controlled, tolerating p.o., she will be discharged with close gynecology follow-up.      FINAL CLINICAL IMPRESSION(S) / ED DIAGNOSES   Final diagnoses:  Nausea/vomiting in pregnancy     Rx / DC Orders   ED Discharge Orders          Ordered    sucralfate (CARAFATE) 1 GM/10ML suspension  4 times daily        12/22/22 0416    famotidine (PEPCID) 20 MG tablet  2 times daily        12/22/22 0416    alum & mag hydroxide-simeth (MAALOX MAX) 400-400-40 MG/5ML suspension  Every 6 hours PRN        12/22/22 0416             Note:  This document was prepared using Dragon voice recognition software and may include unintentional dictation errors.    Pilar Jarvis, MD 12/22/22 [phone]

## 2022-12-22 NOTE — ED Triage Notes (Signed)
Pt to triage via w/c with no distress noted; st [redacted]wks pregnant, G5P1, pt at [facility], recently dx with hyperemesis and rx zofran, phenergan and diclegis without relief; c/o rt sided abd cramping with N/V; denies any bleeding

## 2022-12-22 NOTE — Discharge Instructions (Signed)
As a first-line therapy for your acid reflux, take sucralfate and Maalox max as prescribed.  If these medications are not effective, you may take Pepcid as prescribed.  If you continue to have heartburn after these medications please speak with your gynecologist to discuss a safe proton pump inhibitor to start.  Drink plenty of fluids to stay well-hydrated.  Find Pedialyte or similar electrolyte rehydration formulas at your local pharmacy. Take antinausea medications as prescribed.  Thank you for choosing [place] for your health care today!  Please see your primary doctor this week for a follow up appointment.   If you have any new, worsening, or unexpected symptoms call your doctor right away or come back to the emergency department for reevaluation.  It was my pleasure to care for you today.   Daneil Dan Modesto Charon, MD

## 2022-12-26 ENCOUNTER — Ambulatory Visit: Payer: Medicaid Other | Admitting: Obstetrics and Gynecology

## 2022-12-26 ENCOUNTER — Other Ambulatory Visit: Payer: Self-pay

## 2022-12-26 ENCOUNTER — Ambulatory Visit: Payer: Medicaid Other | Attending: Obstetrics and Gynecology

## 2022-12-26 ENCOUNTER — Other Ambulatory Visit: Payer: Self-pay | Admitting: Certified Nurse Midwife

## 2022-12-26 DIAGNOSIS — O99332 Smoking (tobacco) complicating pregnancy, second trimester: Secondary | ICD-10-CM | POA: Diagnosis not present

## 2022-12-26 DIAGNOSIS — Z3A14 14 weeks gestation of pregnancy: Secondary | ICD-10-CM | POA: Diagnosis not present

## 2022-12-26 DIAGNOSIS — E6689 Other obesity not elsewhere classified: Secondary | ICD-10-CM

## 2022-12-26 DIAGNOSIS — O99412 Diseases of the circulatory system complicating pregnancy, second trimester: Secondary | ICD-10-CM | POA: Diagnosis not present

## 2022-12-26 DIAGNOSIS — O219 Vomiting of pregnancy, unspecified: Secondary | ICD-10-CM | POA: Insufficient documentation

## 2022-12-26 DIAGNOSIS — O26892 Other specified pregnancy related conditions, second trimester: Secondary | ICD-10-CM | POA: Insufficient documentation

## 2022-12-26 DIAGNOSIS — O99212 Obesity complicating pregnancy, second trimester: Secondary | ICD-10-CM

## 2022-12-26 DIAGNOSIS — I480 Paroxysmal atrial fibrillation: Secondary | ICD-10-CM | POA: Insufficient documentation

## 2022-12-26 DIAGNOSIS — I471 Supraventricular tachycardia, unspecified: Secondary | ICD-10-CM | POA: Diagnosis not present

## 2022-12-26 DIAGNOSIS — F17210 Nicotine dependence, cigarettes, uncomplicated: Secondary | ICD-10-CM | POA: Insufficient documentation

## 2022-12-26 DIAGNOSIS — O0991 Supervision of high risk pregnancy, unspecified, first trimester: Secondary | ICD-10-CM

## 2022-12-26 DIAGNOSIS — E669 Obesity, unspecified: Secondary | ICD-10-CM | POA: Diagnosis not present

## 2022-12-26 DIAGNOSIS — R748 Abnormal levels of other serum enzymes: Secondary | ICD-10-CM | POA: Diagnosis not present

## 2022-12-26 DIAGNOSIS — Z363 Encounter for antenatal screening for malformations: Secondary | ICD-10-CM | POA: Insufficient documentation

## 2022-12-26 DIAGNOSIS — O34219 Maternal care for unspecified type scar from previous cesarean delivery: Secondary | ICD-10-CM | POA: Insufficient documentation

## 2022-12-26 DIAGNOSIS — I499 Cardiac arrhythmia, unspecified: Secondary | ICD-10-CM | POA: Diagnosis not present

## 2022-12-26 DIAGNOSIS — O09292 Supervision of pregnancy with other poor reproductive or obstetric history, second trimester: Secondary | ICD-10-CM | POA: Diagnosis not present

## 2022-12-26 NOTE — Progress Notes (Unsigned)
Maternal-Fetal Medicine   Name: Tiffany Andrews DOB: [birth year] MRN: [id number] Referring Provider: Donato Andrews, CNM  I had the [facility] of [facility]. She is G4 P1021 at 14w 2d gestation and is here for ultrasound evaluation of her pregnancy and consultation. Her past medical history is significant for supraventricular tachycardia (SVT) and paroxysmal atrial fibrillation. Patient reports that diagnosis of SVT was made in 2022 and she took metoprolol and flecainide (Sept 2022). In January 2023, sotalol was started,w chich was later discontinued. She had negative stress test and normal echocardiography Patient reports that cardioablation was discussed. A differential diagnosis of POTS was made and it seems ablation was not seriously considered. She does not have congenital heart disease. 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  No history of hyperthyroidism. In May 2024, she was seen by Tiffany Andrews, electrophysiologist at [facility] and the patient had 30-day monitoring. The results will be discussed at her visit next week.  Patient reports symptoms of shortness of breath with moderate activity, no chest pain and occasional palpitations.  She had severe nausea and vomiting that led to ED visit 4 days ago. Liver enzymes were increased. Patient received IV fluids and was discharged. Right upper quadrant ultrasound was normal (history of cholecystectomy).  On cell-free fetal DNA screening, the risks of fetal aneuploidies are not increased.  Patient reports that she had some increased blood glucose values on self monitoring. Recent hemoglobin A1C was 4.7%. She reports no other chronic medical conditions.  PSH: Cesarean section (2019), appendectomy (2015), cholecystectomy (2019), hemicolectomy (2023), left salpingectomy (ectopic pregnancy).  Medications: Prenatal vitamins, Pepcid,  Zofran. Allergies: Shellfish, iodinated contrasts, venom-honey bee, dilaudid, latex. Social history: Smokes 3 cigarettes daily. No alcohol or drug use. Her partner accompanied her today and he is in good health. He is not the father of her first child.  Obstetric history is significant for a term cesarean delivery (breech presentation) in 2019 of a female infant.  No uterine anomalies were seen. Her son is in good health.  Ultrasound Fetal biometry is consistent with her previously-established dates and good fetal heart activity is seen. Fetal anatomical survey is very limited because of early gestational age.   Our concerns include: Supraventricular tachycardia  -Cardiovascular physiological changes in pregnancy can be associated with increased risk of arrhythmias including SVTs. Normal pregnancy is associated with increased heart rate and cardiac output. During labor, heart rate increases. However, it is unclear whether pregnancy increases SVT episodes. -Our patient seems to have had episodes of SVT in the past and it seems that anti-arryhythmic drugs are not strongly recommended now. Complete evaluation may be possible after the results of 30-day monitoring and the patient will be seeing her electrophysiologist next week. No direct adverse fetal effects are to be expected from transient episodes of SVT. -Beta blockers are commonly used to control SVT and they can be safely given in pregnancy. They may cause low birth weight in some cases. Treatment should not be delayed because of her pregnancy and it should be no different from treating a nonpregnant woman. -Cardiac ablation procedures can be safely performed in pregnancy if indicated. Only minimal fluoroscopic exposure is expected. -Patient was counseled perform Valsalva maneuvers to reduce heart rate. However, excessive pressure on eyeballs should be avoided. -I encouraged follow-up with her cardiologist during pregnancy. -During labor and in  second stage, we expect further increases in maternal heart rate.  I encouraged her to have early epidural analgesia since pain can increase maternal heart rate.  -Vaginal delivery is not contraindicated and cesarean section should be performed only for obstetric indications. -During labor, IV metoprolol should be available if SVT occurs. Some recommend IV metoprolol 2.5 mg over 5 minutes (not more than 5 mg if no response). Cardiology consultation should be obtained for appropriate drug/dosage regimens.  Atrial fibrillation It is unclear if she had atrial fibrillation (AF). Pregnancy is a procoagulant state and pregnant patients with AF are at a higher risk for thromboembolism. I recommend heparin anticoagulation. Therapeutic dosage may be required based on the risk assessment by cardiologist. If ablation is advised, it should be performed.  Increased liver enzymes I reassured the patient that increased liver enzymes is due to vomiting in pregnancy (normal ultrasound). She was encouraged to increase oral hydration. If she does not respond to oral hydration, home IV fluids with electrolyte replacement is an option.  Previous cesarean delivery Patient expressed some interest in vaginal birth after cesarean section.  The risk of scar rupture is around 1%.  Repeat cesarean deliveries increase the risk of placenta previa and/or placenta accreta spectrum and subsequent pregnancies.  I counseled her that vaginal delivery can be safely attempted.  Patient is 5 feet 6 inches tall and has a higher chance of having a successful vaginal delivery.   Recommendations -An appointment was made for her to return in 6 weeks for detailed fetal anatomical survey. -Follow cardiology consultation and recommendations.  Discuss with MFM if atrial fibrillation is diagnosed. She may require heparin anticoagulation. -Repeat liver enzymes at her next prenatal visit.  Thank you for consultation.  If you have any questions or  concerns, please contact me the [facility].  Consultation including face-to-face counseling 45 minutes.

## 2022-12-28 ENCOUNTER — Other Ambulatory Visit: Payer: Self-pay | Admitting: Obstetrics and Gynecology

## 2022-12-28 DIAGNOSIS — I519 Heart disease, unspecified: Secondary | ICD-10-CM

## 2023-01-02 DIAGNOSIS — R748 Abnormal levels of other serum enzymes: Secondary | ICD-10-CM

## 2023-01-02 DIAGNOSIS — O210 Mild hyperemesis gravidarum: Secondary | ICD-10-CM | POA: Insufficient documentation

## 2023-01-02 DIAGNOSIS — Z98891 History of uterine scar from previous surgery: Secondary | ICD-10-CM | POA: Insufficient documentation

## 2023-01-02 DIAGNOSIS — F32A Depression, unspecified: Secondary | ICD-10-CM | POA: Insufficient documentation

## 2023-01-02 DIAGNOSIS — O99211 Obesity complicating pregnancy, first trimester: Secondary | ICD-10-CM | POA: Insufficient documentation

## 2023-01-02 HISTORY — DX: Abnormal levels of other serum enzymes: R74.8

## 2023-01-07 ENCOUNTER — Other Ambulatory Visit: Payer: Self-pay

## 2023-01-07 ENCOUNTER — Emergency Department: Payer: Medicaid Other

## 2023-01-07 ENCOUNTER — Emergency Department
Admission: EM | Admit: 2023-01-07 | Discharge: 2023-01-07 | Disposition: A | Discharge status: Home / Self Care | Payer: Medicaid Other | Attending: Emergency Medicine | Admitting: Emergency Medicine

## 2023-01-07 DIAGNOSIS — O4702 False labor before 37 completed weeks of gestation, second trimester: Secondary | ICD-10-CM | POA: Insufficient documentation

## 2023-01-07 DIAGNOSIS — O479 False labor, unspecified: Secondary | ICD-10-CM

## 2023-01-07 DIAGNOSIS — O26892 Other specified pregnancy related conditions, second trimester: Secondary | ICD-10-CM | POA: Diagnosis present

## 2023-01-07 DIAGNOSIS — Z3A16 16 weeks gestation of pregnancy: Secondary | ICD-10-CM | POA: Diagnosis not present

## 2023-01-07 LAB — URINALYSIS, W/ REFLEX TO CULTURE (INFECTION SUSPECTED)
Bilirubin Urine: NEGATIVE
Glucose, UA: NEGATIVE mg/dL
Hgb urine dipstick: NEGATIVE
Ketones, ur: 5 mg/dL — AB
Nitrite: NEGATIVE
Protein, ur: NEGATIVE mg/dL
Specific Gravity, Urine: 1.024 (ref 1.005–1.030)
pH: 6 (ref 5.0–8.0)

## 2023-01-07 LAB — COMPREHENSIVE METABOLIC PANEL
ALT: 20 U/L (ref 0–44)
AST: 21 U/L (ref 15–41)
Albumin: 3.3 g/dL — ABNORMAL LOW (ref 3.5–5.0)
Alkaline Phosphatase: 54 U/L (ref 38–126)
Anion gap: 11 (ref 5–15)
BUN: <5 mg/dL — ABNORMAL LOW (ref 6–20)
CO2: 18 mmol/L — ABNORMAL LOW (ref 22–32)
Calcium: 8.5 mg/dL — ABNORMAL LOW (ref 8.9–10.3)
Chloride: 106 mmol/L (ref 98–111)
Creatinine, Ser: 0.49 mg/dL (ref 0.44–1.00)
GFR, Estimated: >60 mL/min (ref >60)
Glucose, Bld: 107 mg/dL — ABNORMAL HIGH (ref 70–99)
Potassium: 3.3 mmol/L — ABNORMAL LOW (ref 3.5–5.1)
Sodium: 135 mmol/L (ref 135–145)
Total Bilirubin: 0.6 mg/dL (ref <1.2)
Total Protein: 6.5 g/dL (ref 6.5–8.1)

## 2023-01-07 LAB — CBC
HCT: 39 % (ref 36.0–46.0)
Hemoglobin: 13.1 g/dL (ref 12.0–15.0)
MCH: 29.2 pg (ref 26.0–34.0)
MCHC: 33.6 g/dL (ref 30.0–36.0)
MCV: 87.1 fL (ref 80.0–100.0)
Platelets: 237 10*3/uL (ref 150–400)
RBC: 4.48 MIL/uL (ref 3.87–5.11)
RDW: 13 % (ref 11.5–15.5)
WBC: 10.9 10*3/uL — ABNORMAL HIGH (ref 4.0–10.5)
nRBC: 0 % (ref 0.0–0.2)

## 2023-01-07 LAB — HCG, QUANTITATIVE, PREGNANCY: hCG, Beta Chain, Quant, S: 22940 m[IU]/mL — ABNORMAL HIGH (ref <5)

## 2023-01-07 NOTE — ED Provider Notes (Signed)
[facility] Provider Note    Event Date/Time   First MD Initiated Contact with Patient 01/07/23 2032     (approximate)   History   Chief Complaint: Contractions   HPI  Tiffany Andrews is a 27 y.o. female with a history of asthma, GERD, [redacted] weeks pregnant who comes to the ED due to crampy/contraction lower abdominal pain that started earlier today.  Most prominent in the right lower quadrant radiating to the groin as well as to the right lower back.  Denies any history of kidney stones.  Has had multiple prior abdominal surgeries including appendectomy, cholecystectomy, partial bowel resection, cesarean section.  No fever, no vomiting or diarrhea.  Denies contractions or leakage of fluid or vaginal bleeding.  No dysuria.          Physical Exam   Triage Vital Signs: ED Triage Vitals  Encounter Vitals Group     BP 01/07/23 1909 133/80     Systolic BP Percentile --      Diastolic BP Percentile --      Pulse Rate 01/07/23 1909 (!) 120     Resp 01/07/23 1909 16     Temp 01/07/23 1909 98 F (36.7 C)     Temp Source 01/07/23 1909 Oral     SpO2 01/07/23 1909 99 %     Weight 01/07/23 1910 207 lb 3.7 oz (94 kg)     Height 01/07/23 1910 5\' 6"  (1.676 m)     Head Circumference --      Peak Flow --      Pain Score 01/07/23 1910 0     Pain Loc --      Pain Education --      Exclude from Growth Chart --     Most recent vital signs: Vitals:   01/07/23 1909  BP: 133/80  Pulse: (!) 120  Resp: 16  Temp: 98 F (36.7 C)  SpO2: 99%    General: Awake, no distress.  CV:  Good peripheral perfusion.  Regular rate and rhythm, heart rate 90 Resp:  Normal effort.  Clear to auscultation bilaterally Abd:  No distention.  Soft, mild suprapubic and right lower quadrant tenderness Other:  Sterile bimanual exam reveals closed cervix, no effacement   ED Results / Procedures / Treatments   Labs (all labs ordered are listed, but only abnormal results are  displayed) Labs Reviewed  CBC - Abnormal; Notable for the following components:      Result Value   WBC 10.9 (*)    All other components within normal limits  COMPREHENSIVE METABOLIC PANEL - Abnormal; Notable for the following components:   Potassium 3.3 (*)    CO2 18 (*)    Glucose, Bld 107 (*)    BUN <5 (*)    Calcium 8.5 (*)    Albumin 3.3 (*)    All other components within normal limits  HCG, QUANTITATIVE, PREGNANCY - Abnormal; Notable for the following components:   hCG, Beta Chain, Quant, S 22,940 (*)    All other components within normal limits  URINALYSIS, W/ REFLEX TO CULTURE (INFECTION SUSPECTED) - Abnormal; Notable for the following components:   Color, Urine AMBER (*)    APPearance CLOUDY (*)    Ketones, ur 5 (*)    Leukocytes,Ua TRACE (*)    Bacteria, UA RARE (*)    All other components within normal limits     EKG    RADIOLOGY Ultrasound OB interpreted by me, shows viable  IUP with grossly normal-appearing amniotic fluid, fetal heart rate, and closed cervix.  Radiology report reviewed  Ultrasound renal negative for hydronephrosis or visible ureterolithiasis.   PROCEDURES:  Procedures   MEDICATIONS ORDERED IN ED: Medications - No data to display   IMPRESSION / MDM / ASSESSMENT AND PLAN / ED COURSE  I reviewed the triage vital signs and the nursing notes.  DDx: Deberah Pelton contractions, UTI, cervical incompetence, kidney stone  Patient's presentation is most consistent with acute presentation with potential threat to life or bodily function.  Patient presents with pelvic pain, [redacted] weeks pregnant.  No symptoms of pregnancy complication other than contractions, OB ultrasound appears normal.  Sterile bimanual exam normal without cervical dilation/effacement.  Labs unremarkable   Clinical Course as of 01/07/23 2222  Sat Jan 07, 2023  2128 US renal interpreted by me, no visible stone, no hydronephrosis [PS]    Clinical Course User Index [PS]  Sharman Cheek, MD    ----------------------------------------- 11:23 PM on 01/07/2023 ----------------------------------------- Reassuring workup, stable for discharge to follow-up with prenatal care.   FINAL CLINICAL IMPRESSION(S) / ED DIAGNOSES   Final diagnoses:  Braxton Hicks contractions     Rx / DC Orders   ED Discharge Orders     None        Note:  This document was prepared using Dragon voice recognition software and may include unintentional dictation errors.   Sharman Cheek, MD 01/07/23 [phone]

## 2023-01-07 NOTE — ED Notes (Signed)
Ultrasound at bedside

## 2023-01-07 NOTE — ED Triage Notes (Signed)
Pt to ed from home via POV for contractions. Not consistent.  16weeks. Pt is caox4, in no acute distress and ambulatory in triage. Pt denies any discharge or bleeding. She called her OB and they said to come in.

## 2023-01-07 NOTE — ED Notes (Signed)
Discharge instructions reviewed.   Opportunity for questions and concerns provided.   Alert, oriented and ambulatory on departure.   Encouraged to make notification with OB listed as soon as possible and seek medical care with worsening symptoms.

## 2023-01-27 ENCOUNTER — Encounter: Payer: Self-pay | Admitting: Dietician

## 2023-01-27 ENCOUNTER — Encounter: Payer: Medicaid Other | Attending: Certified Nurse Midwife | Admitting: Dietician

## 2023-01-27 DIAGNOSIS — Z3A00 Weeks of gestation of pregnancy not specified: Secondary | ICD-10-CM | POA: Insufficient documentation

## 2023-01-27 DIAGNOSIS — O24410 Gestational diabetes mellitus in pregnancy, diet controlled: Secondary | ICD-10-CM | POA: Diagnosis present

## 2023-01-27 DIAGNOSIS — O24419 Gestational diabetes mellitus in pregnancy, unspecified control: Secondary | ICD-10-CM

## 2023-01-27 DIAGNOSIS — Z713 Dietary counseling and surveillance: Secondary | ICD-10-CM | POA: Insufficient documentation

## 2023-01-27 NOTE — Progress Notes (Signed)
Patient was seen for Gestational Diabetes self-management on 01/27/2023  Start time 0910 and End time 1015   Estimated due date: 06/24/2023; [redacted]w[redacted]d  Clinical: Medications: Aspirin, Zofran, Promethazine (PRN), Prenatal MVI, EpiPen Medical History: Obesity, SVT, Afib, Bradycardia Labs: OGTT - Unable to test r/t hyperemsis gravidarum, A1c 4.7%   Dietary and Lifestyle History: Pt reports history of caring for grandmother who was a diabetic, states they have a good understanding of their dietary needs for glycemic control, states they choose whole grains, doesn't crave sweets, eats plenty of vegetables. Pt reports weight loss of ~18 lbs from 7 weeks to 17 weeks due to hyperemesis gravidas, weight is beginning to trend up now, N/V symptoms lessening. Pt reports meal planning currently with husband, states they feel confident in their ability to prep balanced meals.  Pt reports history of cholecystectomy, and bowel resection, reports diverticulitis and follows an appropriate diet to prevent flare-ups.   Physical Activity: Minimal, activity restrictions r/t SVT/Afib Stress: Mild Sleep: Insomnia, sleeps ~6 hours  24 hr Recall:  First Meal: No breakfast (nausea) Snack: Second meal: Cobb Salad (eggs, bacon bits, ranch) Snack: Nutrigrain bar Third meal: [place] taco on romaine, rice Snack:Nutrigrain bar Beverages: Flavored water (Propel), decaf coffee  NUTRITION INTERVENTION  Nutrition education (E-1) on the following topics:   Initial Follow-up  [x]  []  Definition of Gestational Diabetes [x]  []  Why dietary management is important in controlling blood glucose [x]  []  Effects each nutrient has on blood glucose levels [x]  []  Simple carbohydrates vs complex carbohydrates [x]  []  Fluid intake [x]  []  Creating a balanced meal plan [x]  []  Carbohydrate counting  [x]  []  When to check blood glucose levels [x]  []  Proper blood glucose monitoring techniques [x]  []  Effect of stress and stress reduction  techniques  [x]  []  Exercise effect on blood glucose levels, appropriate exercise during pregnancy [x]  []  Importance of limiting caffeine and abstaining from alcohol and smoking [x]  []  Medications used for blood sugar control during pregnancy [x]  []  Hypoglycemia and rule of 15 [x]  []  Postpartum self care   Patient has a meter prior to visit. Patient is testing pre breakfast and 2 hours after each meal. FBS: 80-90 Postprandial: 105  Patient instructed to monitor glucose levels: FBS: 60 - <= 95 mg/dL; 2 hour: <= 784 mg/dL  Patient received handouts: Nutrition Diabetes and Pregnancy Carbohydrate Counting List Blood glucose log Snack ideas for diabetes during pregnancy Food Assistance Resources  Patient will be seen for follow-up as needed.

## 2023-01-30 DIAGNOSIS — O24410 Gestational diabetes mellitus in pregnancy, diet controlled: Secondary | ICD-10-CM | POA: Insufficient documentation

## 2023-02-02 ENCOUNTER — Emergency Department: Payer: Medicaid Other

## 2023-02-02 ENCOUNTER — Other Ambulatory Visit: Payer: Self-pay

## 2023-02-02 ENCOUNTER — Emergency Department
Admission: EM | Admit: 2023-02-02 | Discharge: 2023-02-02 | Disposition: A | Discharge status: Home / Self Care | Payer: Medicaid Other | Attending: Emergency Medicine | Admitting: Emergency Medicine

## 2023-02-02 DIAGNOSIS — R109 Unspecified abdominal pain: Secondary | ICD-10-CM | POA: Insufficient documentation

## 2023-02-02 DIAGNOSIS — O26892 Other specified pregnancy related conditions, second trimester: Secondary | ICD-10-CM | POA: Diagnosis present

## 2023-02-02 DIAGNOSIS — Z3A19 19 weeks gestation of pregnancy: Secondary | ICD-10-CM | POA: Insufficient documentation

## 2023-02-02 LAB — CBC
HCT: 32.9 % — ABNORMAL LOW (ref 36.0–46.0)
Hemoglobin: 11.3 g/dL — ABNORMAL LOW (ref 12.0–15.0)
MCH: 30.5 pg (ref 26.0–34.0)
MCHC: 34.3 g/dL (ref 30.0–36.0)
MCV: 88.9 fL (ref 80.0–100.0)
Platelets: 232 10*3/uL (ref 150–400)
RBC: 3.7 MIL/uL — ABNORMAL LOW (ref 3.87–5.11)
RDW: 13.9 % (ref 11.5–15.5)
WBC: 10.8 10*3/uL — ABNORMAL HIGH (ref 4.0–10.5)
nRBC: 0 % (ref 0.0–0.2)

## 2023-02-02 LAB — BASIC METABOLIC PANEL
Anion gap: 9 (ref 5–15)
BUN: <5 mg/dL — ABNORMAL LOW (ref 6–20)
CO2: 19 mmol/L — ABNORMAL LOW (ref 22–32)
Calcium: 8.3 mg/dL — ABNORMAL LOW (ref 8.9–10.3)
Chloride: 106 mmol/L (ref 98–111)
Creatinine, Ser: 0.53 mg/dL (ref 0.44–1.00)
GFR, Estimated: >60 mL/min (ref >60)
Glucose, Bld: 81 mg/dL (ref 70–99)
Potassium: 3.6 mmol/L (ref 3.5–5.1)
Sodium: 134 mmol/L — ABNORMAL LOW (ref 135–145)

## 2023-02-02 LAB — HCG, QUANTITATIVE, PREGNANCY: hCG, Beta Chain, Quant, S: 21137 m[IU]/mL — ABNORMAL HIGH (ref <5)

## 2023-02-02 NOTE — Discharge Instructions (Signed)
Please call and schedule an appointment with your OB.  Return to the ER for symptoms of concern if unable to schedule an appointment.

## 2023-02-02 NOTE — ED Provider Notes (Signed)
[facility] Provider Note    Event Date/Time   First MD Initiated Contact with Patient 02/02/23 1952     (approximate)   History   No chief complaint on file.   HPI  Samina M Plumer is a 27 y.o. female with history of bradycardia, SVT, and preterm delivery of her last child and as listed in EMR presents to the emergency department for evaluation of sensation of contractions.  She is approximately 19 weeks.  She spoke with her OB/GYN who recommended that she come to the emergency department for evaluation.  She is having no vaginal bleeding or discharge.  She has had no gush of fluids.  She is having no urinary symptoms..      Physical Exam   Triage Vital Signs: ED Triage Vitals  Encounter Vitals Group     BP 02/02/23 1656 111/70     Systolic BP Percentile --      Diastolic BP Percentile --      Pulse Rate 02/02/23 1656 (!) 105     Resp 02/02/23 1656 16     Temp 02/02/23 1656 98 F (36.7 C)     Temp src --      SpO2 02/02/23 1656 100 %     Weight 02/02/23 1657 207 lb 3.7 oz (94 kg)     Height 02/02/23 1657 5\' 6"  (1.676 m)     Head Circumference --      Peak Flow --      Pain Score 02/02/23 1656 4     Pain Loc --      Pain Education --      Exclude from Growth Chart --     Most recent vital signs: Vitals:   02/02/23 1656 02/02/23 2229  BP: 111/70 114/70  Pulse: (!) 105 98  Resp: 16 18  Temp: 98 F (36.7 C) 98.1 F (36.7 C)  SpO2: 100% 99%    General: Awake, no distress.  CV:  Good peripheral perfusion.  Resp:  Normal effort.  Abd:  No distention.  Other:  Gravid abdomen   ED Results / Procedures / Treatments   Labs (all labs ordered are listed, but only abnormal results are displayed) Labs Reviewed  CBC - Abnormal; Notable for the following components:      Result Value   WBC 10.8 (*)    RBC 3.70 (*)    Hemoglobin 11.3 (*)    HCT 32.9 (*)    All other components within normal limits  BASIC METABOLIC PANEL - Abnormal;  Notable for the following components:   Sodium 134 (*)    CO2 19 (*)    BUN <5 (*)    Calcium 8.3 (*)    All other components within normal limits  HCG, QUANTITATIVE, PREGNANCY - Abnormal; Notable for the following components:   hCG, Beta Chain, Quant, S 21,137 (*)    All other components within normal limits     EKG     RADIOLOGY  Image and radiology report reviewed and interpreted by me. Radiology report consistent with the same.  Single IUP with fetal heart rate of 143 measuring 19 weeks and 6 days.  PROCEDURES:  Critical Care performed: No  Procedures   MEDICATIONS ORDERED IN ED:  Medications - No data to display   IMPRESSION / MDM / ASSESSMENT AND PLAN / ED COURSE   I have reviewed the triage note.  Differential diagnosis includes, but is not limited to, preterm labor, Deberah Pelton  contractions, round ligament pain  Patient's presentation is most consistent with acute presentation with potential threat to life or bodily function.  27 year old female presenting to the emergency department for treatment and evaluation of abdominal pain during pregnancy.  See HPI for further details.  Beta-hCG 21,137 which is down from 22,940 3 weeks ago. Otherwise labs are reassuring. Patient no longer having "contractions" but feels a "pulling feeling" on the right side.  [place] is without acute findings.    Results discussed with patient who feels reassured. Strict ER return precautions given.     FINAL CLINICAL IMPRESSION(S) / ED DIAGNOSES   Final diagnoses:  Abdominal pain during pregnancy in second trimester     Rx / DC Orders   ED Discharge Orders     None        Note:  This document was prepared using Dragon voice recognition software and may include unintentional dictation errors.   Chinita Pester, FNP 02/02/23 2250    Dionne Bucy, MD 02/02/23 2351

## 2023-02-02 NOTE — ED Triage Notes (Signed)
Pt to ED for contractions 5 mins apart. 19 weeks. L& D will not take upstairs. Denies vaginal bleeding or discharge. Hx preterm labor

## 2023-02-02 NOTE — ED Provider Triage Note (Signed)
Emergency Medicine Provider Triage Evaluation Note  Tiffany Andrews , a 27 y.o. female  was evaluated in triage.  Pt complains of contractions since 3:30pm that occur about every 5 minutes and last about 45 seconds. Similar occurred prior to Christmas and she had a negative workup at that time. No vaginal bleeding. No relief with water or lying on left side.   Physical Exam  LMP 07/06/2022 (Approximate)  Gen:   Awake, no distress   Resp:  Normal effort  MSK:   Moves extremities without difficulty  Other:    Medical Decision Making  Medically screening exam initiated at 4:56 PM.  Appropriate orders placed.  Tiffany Andrews was informed that the remainder of the evaluation will be completed by another provider, this initial triage assessment does not replace that evaluation, and the importance of remaining in the ED until their evaluation is complete.    Chinita Pester, FNP 02/02/23 1700

## 2023-02-06 ENCOUNTER — Other Ambulatory Visit: Payer: Self-pay | Admitting: Obstetrics and Gynecology

## 2023-02-06 ENCOUNTER — Other Ambulatory Visit: Payer: Self-pay

## 2023-02-06 ENCOUNTER — Ambulatory Visit: Payer: Medicaid Other | Attending: Obstetrics and Gynecology

## 2023-02-06 DIAGNOSIS — I471 Supraventricular tachycardia, unspecified: Secondary | ICD-10-CM

## 2023-02-06 DIAGNOSIS — O24410 Gestational diabetes mellitus in pregnancy, diet controlled: Secondary | ICD-10-CM | POA: Diagnosis not present

## 2023-02-06 DIAGNOSIS — Z3A20 20 weeks gestation of pregnancy: Secondary | ICD-10-CM | POA: Diagnosis not present

## 2023-02-06 DIAGNOSIS — O99212 Obesity complicating pregnancy, second trimester: Secondary | ICD-10-CM | POA: Diagnosis not present

## 2023-02-06 DIAGNOSIS — O99412 Diseases of the circulatory system complicating pregnancy, second trimester: Secondary | ICD-10-CM | POA: Insufficient documentation

## 2023-02-06 DIAGNOSIS — Z363 Encounter for antenatal screening for malformations: Secondary | ICD-10-CM | POA: Diagnosis not present

## 2023-02-06 DIAGNOSIS — I480 Paroxysmal atrial fibrillation: Secondary | ICD-10-CM

## 2023-02-06 DIAGNOSIS — I519 Heart disease, unspecified: Secondary | ICD-10-CM | POA: Insufficient documentation

## 2023-02-06 DIAGNOSIS — O321XX0 Maternal care for breech presentation, not applicable or unspecified: Secondary | ICD-10-CM | POA: Diagnosis not present

## 2023-02-06 DIAGNOSIS — Q249 Congenital malformation of heart, unspecified: Secondary | ICD-10-CM

## 2023-02-06 DIAGNOSIS — E669 Obesity, unspecified: Secondary | ICD-10-CM

## 2023-02-09 ENCOUNTER — Observation Stay
Admission: EM | Admit: 2023-02-09 | Discharge: 2023-02-09 | Disposition: A | Discharge status: Home / Self Care | Payer: Medicaid Other | Source: Home / Self Care | Admitting: Obstetrics and Gynecology

## 2023-02-09 ENCOUNTER — Inpatient Hospital Stay
Admission: EM | Admit: 2023-02-09 | Discharge: 2023-02-12 | DRG: 832 | Disposition: A | Discharge status: Home / Self Care | Payer: Medicaid Other

## 2023-02-09 ENCOUNTER — Encounter: Payer: Self-pay | Admitting: Obstetrics and Gynecology

## 2023-02-09 DIAGNOSIS — I4891 Unspecified atrial fibrillation: Secondary | ICD-10-CM | POA: Insufficient documentation

## 2023-02-09 DIAGNOSIS — R399 Unspecified symptoms and signs involving the genitourinary system: Principal | ICD-10-CM | POA: Diagnosis present

## 2023-02-09 DIAGNOSIS — Z3A20 20 weeks gestation of pregnancy: Secondary | ICD-10-CM | POA: Insufficient documentation

## 2023-02-09 DIAGNOSIS — D509 Iron deficiency anemia, unspecified: Secondary | ICD-10-CM | POA: Diagnosis present

## 2023-02-09 DIAGNOSIS — N390 Urinary tract infection, site not specified: Secondary | ICD-10-CM | POA: Diagnosis present

## 2023-02-09 DIAGNOSIS — O99212 Obesity complicating pregnancy, second trimester: Secondary | ICD-10-CM | POA: Diagnosis present

## 2023-02-09 DIAGNOSIS — O99012 Anemia complicating pregnancy, second trimester: Secondary | ICD-10-CM | POA: Diagnosis present

## 2023-02-09 DIAGNOSIS — O2302 Infections of kidney in pregnancy, second trimester: Principal | ICD-10-CM | POA: Diagnosis present

## 2023-02-09 DIAGNOSIS — N10 Acute tubulo-interstitial nephritis: Secondary | ICD-10-CM | POA: Diagnosis present

## 2023-02-09 DIAGNOSIS — I480 Paroxysmal atrial fibrillation: Secondary | ICD-10-CM | POA: Diagnosis present

## 2023-02-09 DIAGNOSIS — R1030 Lower abdominal pain, unspecified: Secondary | ICD-10-CM | POA: Insufficient documentation

## 2023-02-09 DIAGNOSIS — O99412 Diseases of the circulatory system complicating pregnancy, second trimester: Secondary | ICD-10-CM | POA: Insufficient documentation

## 2023-02-09 DIAGNOSIS — O26892 Other specified pregnancy related conditions, second trimester: Secondary | ICD-10-CM | POA: Insufficient documentation

## 2023-02-09 DIAGNOSIS — Z87891 Personal history of nicotine dependence: Secondary | ICD-10-CM | POA: Insufficient documentation

## 2023-02-09 DIAGNOSIS — Z8249 Family history of ischemic heart disease and other diseases of the circulatory system: Secondary | ICD-10-CM

## 2023-02-09 DIAGNOSIS — O26899 Other specified pregnancy related conditions, unspecified trimester: Principal | ICD-10-CM | POA: Diagnosis present

## 2023-02-09 DIAGNOSIS — O2300 Infections of kidney in pregnancy, unspecified trimester: Principal | ICD-10-CM | POA: Diagnosis present

## 2023-02-09 DIAGNOSIS — F17290 Nicotine dependence, other tobacco product, uncomplicated: Secondary | ICD-10-CM | POA: Diagnosis present

## 2023-02-09 LAB — CBC
HCT: 33.8 % — ABNORMAL LOW (ref 36.0–46.0)
Hemoglobin: 11.3 g/dL — ABNORMAL LOW (ref 12.0–15.0)
MCH: 30.1 pg (ref 26.0–34.0)
MCHC: 33.4 g/dL (ref 30.0–36.0)
MCV: 89.9 fL (ref 80.0–100.0)
Platelets: 229 10*3/uL (ref 150–400)
RBC: 3.76 MIL/uL — ABNORMAL LOW (ref 3.87–5.11)
RDW: 14 % (ref 11.5–15.5)
WBC: 12.7 10*3/uL — ABNORMAL HIGH (ref 4.0–10.5)
nRBC: 0 % (ref 0.0–0.2)

## 2023-02-09 LAB — URINALYSIS, COMPLETE (UACMP) WITH MICROSCOPIC
Bilirubin Urine: NEGATIVE
Glucose, UA: NEGATIVE mg/dL
Ketones, ur: NEGATIVE mg/dL
Nitrite: NEGATIVE
Protein, ur: NEGATIVE mg/dL
Specific Gravity, Urine: 1.012 (ref 1.005–1.030)
pH: 7 (ref 5.0–8.0)

## 2023-02-09 LAB — WET PREP, GENITAL
Clue Cells Wet Prep HPF POC: NONE SEEN
Sperm: NONE SEEN
Trich, Wet Prep: NONE SEEN
WBC, Wet Prep HPF POC: <10 (ref <10)
Yeast Wet Prep HPF POC: NONE SEEN

## 2023-02-09 MED ORDER — CYCLOBENZAPRINE HCL 5 MG PO TABS
10.0000 mg | ORAL_TABLET | Freq: Once | ORAL | Status: AC
  Administered 2023-02-09: 10 mg via ORAL
  Filled 2023-02-09: qty 2

## 2023-02-09 MED ORDER — AMOXICILLIN-POT CLAVULANATE 875-125 MG PO TABS: 1.0000 | ORAL_TABLET | Freq: Two times a day (BID) | ORAL | 0 refills | Status: DC

## 2023-02-09 MED ORDER — ACETAMINOPHEN 325 MG PO TABS
650.0000 mg | ORAL_TABLET | ORAL | Status: DC | PRN
  Administered 2023-02-09: 650 mg via ORAL
  Filled 2023-02-09: qty 2

## 2023-02-09 NOTE — OB Triage Note (Signed)
 [name] 27 y.o. @[place]  @[place]   presents to Labor & Delivery triage via wheelchair steered by ED staff reporting abdominal pain. . She denies signs and symptoms consistent with rupture of membranes or active vaginal bleeding. She denies contractions and states positive fetal movement. External FM and TOCO applied to non-tender abdomen. Initial FHR 150. Vital signs obtained and within normal limits. Patient oriented to care environment including call bell and bed control use. Delon Coe, CNM notified of patient's arrival.

## 2023-02-09 NOTE — Discharge Summary (Signed)
 Patient ID: Tiffany Andrews MRN: [id number] DOB/AGE: 03-Nov-1995 27 y.o.  Admit date: 02/09/2023 Discharge date: 02/09/2023  Admission Diagnoses: 27yo G4P1 at [redacted]w[redacted]d presents with dull lower abdominal pain until she moves then she reports a sharp shooting pain from her abdomin down to her vagina. Tylenol  1g was at 2145 with no relief.  She has a history of early 5 week SAB x2. She was seen in the ED on 02/02/23 and an u/s was completed as well as seeing MFM on 02/06/23.  Both u/s showed a closed, 3.1 cm cervix.  Discharge Diagnoses: Common discomfort of early pregnancy  Factors complicating pregnancy: Hx of Ectopic pregnancy 08/24 Previous cesarean section Cardiac history: Atrial fibrillation, SVT Depression -- Hx of severe depression and postpartum depression Obesity BMI: 34  Prenatal Procedures: none  Consults: None  Significant Diagnostic Studies:  Results for orders placed or performed during the hospital encounter of 02/09/23 (from the past week)  Wet prep, genital   Collection Time: 02/09/23 12:39 AM  Result Value Ref Range   Yeast Wet Prep HPF POC NONE SEEN NONE SEEN   Trich, Wet Prep NONE SEEN NONE SEEN   Clue Cells Wet Prep HPF POC NONE SEEN NONE SEEN   WBC, Wet Prep HPF POC <10 <10   Sperm NONE SEEN   Urinalysis, Complete w Microscopic -Urine, Clean Catch   Collection Time: 02/09/23 12:39 AM  Result Value Ref Range   Color, Urine YELLOW (A) YELLOW   APPearance HAZY (A) CLEAR   Specific Gravity, Urine 1.012 1.005 - 1.030   pH 7.0 5.0 - 8.0   Glucose, UA NEGATIVE NEGATIVE mg/dL   Hgb urine dipstick SMALL (A) NEGATIVE   Bilirubin Urine NEGATIVE NEGATIVE   Ketones, ur NEGATIVE NEGATIVE mg/dL   Protein, ur NEGATIVE NEGATIVE mg/dL   Nitrite NEGATIVE NEGATIVE   Leukocytes,Ua LARGE (A) NEGATIVE   RBC / HPF 0-5 0 - 5 RBC/hpf   WBC, UA 21-50 0 - 5 WBC/hpf   Bacteria, UA MANY (A) NONE SEEN   Squamous Epithelial / HPF 0-5 0 - 5 /HPF   Mucus PRESENT   CBC   Collection Time:  02/09/23  1:21 AM  Result Value Ref Range   WBC 12.7 (H) 4.0 - 10.5 K/uL   RBC 3.76 (L) 3.87 - 5.11 MIL/uL   Hemoglobin 11.3 (L) 12.0 - 15.0 g/dL   HCT 66.1 (L) 63.9 - 53.9 %   MCV 89.9 80.0 - 100.0 fL   MCH 30.1 26.0 - 34.0 pg   MCHC 33.4 30.0 - 36.0 g/dL   RDW 85.9 88.4 - 84.4 %   Platelets 229 150 - 400 K/uL   nRBC 0.0 0.0 - 0.2 %  Results for orders placed or performed during the hospital encounter of 02/02/23 (from the past week)  CBC   Collection Time: 02/02/23  4:58 PM  Result Value Ref Range   WBC 10.8 (H) 4.0 - 10.5 K/uL   RBC 3.70 (L) 3.87 - 5.11 MIL/uL   Hemoglobin 11.3 (L) 12.0 - 15.0 g/dL   HCT 67.0 (L) 63.9 - 53.9 %   MCV 88.9 80.0 - 100.0 fL   MCH 30.5 26.0 - 34.0 pg   MCHC 34.3 30.0 - 36.0 g/dL   RDW 86.0 88.4 - 84.4 %   Platelets 232 150 - 400 K/uL   nRBC 0.0 0.0 - 0.2 %  Basic metabolic panel   Collection Time: 02/02/23  4:58 PM  Result Value Ref Range   Sodium 134 (L)  135 - 145 mmol/L   Potassium 3.6 3.5 - 5.1 mmol/L   Chloride 106 98 - 111 mmol/L   CO2 19 (L) 22 - 32 mmol/L   Glucose, Bld 81 70 - 99 mg/dL   BUN <5 (L) 6 - 20 mg/dL   Creatinine, Ser 9.46 0.44 - 1.00 mg/dL   Calcium  8.3 (L) 8.9 - 10.3 mg/dL   GFR, Estimated >39 >39 mL/min   Anion gap 9 5 - 15  hCG, quantitative, pregnancy   Collection Time: 02/02/23  4:58 PM  Result Value Ref Range   hCG, Beta Chain, Quant, S 21,137 (H) <5 mIU/mL     Treatments: analgesia: acetaminophen  and Flexeril   Hospital Course:  This is a 28 y.o. [id number] with IUP at [redacted]w[redacted]d seen for  dull lower abdominal pain until she moves then she reports a sharp shooting pain from her abdomin down to her vagina.  Speculum exam showed closed/long cervix.  UA and wet prep were collected and results can be seen above.   She was observed, fetal heart rate monitoring remained reassuring, and she had no signs/symptoms of labor or other maternal-fetal concerns. Empirically treat for UTI.  She was deemed stable for discharge to home  with outpatient follow up.  Discharge Physical Exam:  BP 115/67 (BP Location: Left Arm)   Pulse 85   Temp 98.6 F (37 C) (Oral)   Resp 18   LMP 07/06/2022 (Approximate)   General: NAD CV: RRR Pulm: nl effort ABD: s/nd/nt, gravid DVT Evaluation: LE non-ttp, no evidence of DVT on exam.   Doppler:  TOCO: quiet SVE: deferred  Dilation: Closed   Discharge Condition: Stable  Disposition: Discharge disposition: 01-Home or Self Care        Allergies as of 02/09/2023       Reactions   Bee Venom Hives, Itching   Iodinated Contrast Media Shortness Of Breath, Nausea Only   Pt experienced nausea, severe facial tingling/swelling, feeling as though throat was tight and closing up, difficulty breathing, chest pressure and tightness    Other Anaphylaxis, Swelling, Other (See Comments)   Stinging insects   Shellfish Allergy Anaphylaxis, Swelling   Latex    Other reaction(s): Unknown   Hydromorphone  Anxiety, Itching        Medication List     STOP taking these medications    lidocaine  5 % Commonly known as: Lidoderm    oxyCODONE  5 MG immediate release tablet Commonly known as: Oxy IR/ROXICODONE        TAKE these medications    amoxicillin -clavulanate 875-125 MG tablet Commonly known as: AUGMENTIN  Take 1 tablet by mouth 2 (two) times daily for 5 days.   EPINEPHrine  0.3 mg/0.3 mL Soaj injection Commonly known as: EPI-PEN Inject 0.3 mg into the muscle as needed for anaphylaxis.   famotidine  20 MG tablet Commonly known as: PEPCID  Take 1 tablet (20 mg total) by mouth 2 (two) times daily.   Maalox Max 400-400-40 MG/5ML suspension Generic drug: alum & mag hydroxide-simeth Take 10 mLs by mouth every 6 (six) hours as needed for indigestion.   multivitamin-prenatal 27-0.8 MG Tabs tablet Take 1 tablet by mouth daily at 12 noon.   ondansetron  4 MG disintegrating tablet Commonly known as: ZOFRAN -ODT Take 1 tablet (4 mg total) by mouth every 8 (eight) hours as needed  for nausea or vomiting.   sucralfate  1 GM/10ML suspension Commonly known as: Carafate  Take 10 mLs (1 g total) by mouth 4 (four) times daily.  SignedBETHA DELON COE, CNM 02/09/2023 4:17 AM

## 2023-02-10 ENCOUNTER — Observation Stay: Payer: Medicaid Other

## 2023-02-10 ENCOUNTER — Encounter: Payer: Self-pay | Admitting: Obstetrics and Gynecology

## 2023-02-10 ENCOUNTER — Other Ambulatory Visit: Payer: Self-pay

## 2023-02-10 DIAGNOSIS — O2300 Infections of kidney in pregnancy, unspecified trimester: Secondary | ICD-10-CM

## 2023-02-10 DIAGNOSIS — N10 Acute tubulo-interstitial nephritis: Secondary | ICD-10-CM | POA: Diagnosis present

## 2023-02-10 DIAGNOSIS — O99212 Obesity complicating pregnancy, second trimester: Secondary | ICD-10-CM | POA: Diagnosis present

## 2023-02-10 DIAGNOSIS — Z8249 Family history of ischemic heart disease and other diseases of the circulatory system: Secondary | ICD-10-CM | POA: Diagnosis not present

## 2023-02-10 DIAGNOSIS — O99012 Anemia complicating pregnancy, second trimester: Secondary | ICD-10-CM | POA: Diagnosis present

## 2023-02-10 DIAGNOSIS — F17290 Nicotine dependence, other tobacco product, uncomplicated: Secondary | ICD-10-CM | POA: Diagnosis present

## 2023-02-10 DIAGNOSIS — I480 Paroxysmal atrial fibrillation: Secondary | ICD-10-CM | POA: Diagnosis present

## 2023-02-10 DIAGNOSIS — R399 Unspecified symptoms and signs involving the genitourinary system: Principal | ICD-10-CM | POA: Diagnosis present

## 2023-02-10 DIAGNOSIS — D509 Iron deficiency anemia, unspecified: Secondary | ICD-10-CM | POA: Diagnosis present

## 2023-02-10 DIAGNOSIS — N390 Urinary tract infection, site not specified: Secondary | ICD-10-CM | POA: Diagnosis present

## 2023-02-10 DIAGNOSIS — O2302 Infections of kidney in pregnancy, second trimester: Secondary | ICD-10-CM | POA: Diagnosis present

## 2023-02-10 HISTORY — DX: Infections of kidney in pregnancy, unspecified trimester: O23.00

## 2023-02-10 LAB — COMPREHENSIVE METABOLIC PANEL
ALT: 10 U/L (ref 0–44)
AST: 11 U/L — ABNORMAL LOW (ref 15–41)
Albumin: 2.9 g/dL — ABNORMAL LOW (ref 3.5–5.0)
Alkaline Phosphatase: 60 U/L (ref 38–126)
Anion gap: 10 (ref 5–15)
BUN: 6 mg/dL (ref 6–20)
CO2: 22 mmol/L (ref 22–32)
Calcium: 8.4 mg/dL — ABNORMAL LOW (ref 8.9–10.3)
Chloride: 103 mmol/L (ref 98–111)
Creatinine, Ser: 0.47 mg/dL (ref 0.44–1.00)
GFR, Estimated: >60 mL/min (ref >60)
Glucose, Bld: 99 mg/dL (ref 70–99)
Potassium: 3.8 mmol/L (ref 3.5–5.1)
Sodium: 135 mmol/L (ref 135–145)
Total Bilirubin: 0.6 mg/dL (ref 0.0–1.2)
Total Protein: 6.7 g/dL (ref 6.5–8.1)

## 2023-02-10 LAB — CBC
HCT: 33.7 % — ABNORMAL LOW (ref 36.0–46.0)
HCT: 28.8 % — ABNORMAL LOW (ref 36.0–46.0)
Hemoglobin: 11.3 g/dL — ABNORMAL LOW (ref 12.0–15.0)
Hemoglobin: 9.8 g/dL — ABNORMAL LOW (ref 12.0–15.0)
MCH: 29.9 pg (ref 26.0–34.0)
MCH: 30.7 pg (ref 26.0–34.0)
MCHC: 33.5 g/dL (ref 30.0–36.0)
MCHC: 34 g/dL (ref 30.0–36.0)
MCV: 89.2 fL (ref 80.0–100.0)
MCV: 90.3 fL (ref 80.0–100.0)
Platelets: 217 10*3/uL (ref 150–400)
Platelets: 212 10*3/uL (ref 150–400)
RBC: 3.78 MIL/uL — ABNORMAL LOW (ref 3.87–5.11)
RBC: 3.19 MIL/uL — ABNORMAL LOW (ref 3.87–5.11)
RDW: 13.9 % (ref 11.5–15.5)
RDW: 14.4 % (ref 11.5–15.5)
WBC: 13.6 10*3/uL — ABNORMAL HIGH (ref 4.0–10.5)
WBC: 8.8 10*3/uL (ref 4.0–10.5)
nRBC: 0 % (ref 0.0–0.2)
nRBC: 0 % (ref 0.0–0.2)

## 2023-02-10 LAB — COMPREHENSIVE METABOLIC PANEL WITH GFR
ALT: 8 U/L (ref 0–44)
AST: 10 U/L — ABNORMAL LOW (ref 15–41)
Albumin: 2.6 g/dL — ABNORMAL LOW (ref 3.5–5.0)
Alkaline Phosphatase: 52 U/L (ref 38–126)
Anion gap: 11 (ref 5–15)
BUN: <5 mg/dL — ABNORMAL LOW (ref 6–20)
CO2: 22 mmol/L (ref 22–32)
Calcium: 8.2 mg/dL — ABNORMAL LOW (ref 8.9–10.3)
Chloride: 100 mmol/L (ref 98–111)
Creatinine, Ser: 0.62 mg/dL (ref 0.44–1.00)
GFR, Estimated: >60 mL/min (ref >60)
Glucose, Bld: 94 mg/dL (ref 70–99)
Potassium: 3.3 mmol/L — ABNORMAL LOW (ref 3.5–5.1)
Sodium: 133 mmol/L — ABNORMAL LOW (ref 135–145)
Total Bilirubin: 0.6 mg/dL (ref 0.0–1.2)
Total Protein: 5.9 g/dL — ABNORMAL LOW (ref 6.5–8.1)

## 2023-02-10 LAB — URINALYSIS, ROUTINE W REFLEX MICROSCOPIC
Bilirubin Urine: NEGATIVE
Glucose, UA: NEGATIVE mg/dL
Ketones, ur: 5 mg/dL — AB
Nitrite: NEGATIVE
Protein, ur: 100 mg/dL — AB
Specific Gravity, Urine: 1.016 (ref 1.005–1.030)
WBC, UA: >50 WBC/hpf (ref 0–5)
pH: 8 (ref 5.0–8.0)

## 2023-02-10 LAB — BRAIN NATRIURETIC PEPTIDE: B Natriuretic Peptide: 43.2 pg/mL (ref 0.0–100.0)

## 2023-02-10 MED ORDER — PRENATAL MULTIVITAMIN CH
1.0000 | ORAL_TABLET | Freq: Every day | ORAL | Status: DC
  Administered 2023-02-10 – 2023-02-11 (×2): 1 via ORAL
  Filled 2023-02-10 (×2): qty 1

## 2023-02-10 MED ORDER — FENTANYL CITRATE (PF) 100 MCG/2ML IJ SOLN
50.0000 ug | INTRAMUSCULAR | Status: DC | PRN
  Administered 2023-02-10 (×2): 50 ug via INTRAVENOUS
  Administered 2023-02-10: 100 ug via INTRAVENOUS
  Administered 2023-02-10: 50 ug via INTRAVENOUS
  Administered 2023-02-11: 100 ug via INTRAVENOUS
  Filled 2023-02-10 (×5): qty 2

## 2023-02-10 MED ORDER — ACETAMINOPHEN 325 MG PO TABS
650.0000 mg | ORAL_TABLET | ORAL | Status: DC | PRN
  Administered 2023-02-10 – 2023-02-12 (×9): 650 mg via ORAL
  Filled 2023-02-10 (×9): qty 2

## 2023-02-10 MED ORDER — ONDANSETRON HCL 4 MG/2ML IJ SOLN
4.0000 mg | Freq: Four times a day (QID) | INTRAMUSCULAR | Status: DC | PRN
  Administered 2023-02-10 – 2023-02-11 (×3): 4 mg via INTRAVENOUS
  Filled 2023-02-10 (×4): qty 2

## 2023-02-10 MED ORDER — DOCUSATE SODIUM 100 MG PO CAPS
100.0000 mg | ORAL_CAPSULE | Freq: Every day | ORAL | Status: DC
  Administered 2023-02-10 – 2023-02-11 (×2): 100 mg via ORAL
  Filled 2023-02-10 (×2): qty 1

## 2023-02-10 MED ORDER — IRON SUCROSE 300 MG IVPB - SIMPLE MED
300.0000 mg | Freq: Once | Status: AC
  Administered 2023-02-11: 300 mg via INTRAVENOUS
  Filled 2023-02-10: qty 300

## 2023-02-10 MED ORDER — ZOLPIDEM TARTRATE 5 MG PO TABS: 5.0000 mg | ORAL_TABLET | Freq: Every evening | ORAL | Status: DC | PRN

## 2023-02-10 MED ORDER — CALCIUM CARBONATE ANTACID 500 MG PO CHEW
2.0000 | CHEWABLE_TABLET | ORAL | Status: DC | PRN
  Administered 2023-02-11 (×2): 400 mg via ORAL
  Filled 2023-02-10 (×2): qty 2

## 2023-02-10 MED ORDER — LACTATED RINGERS IV SOLN: INTRAVENOUS | Status: AC

## 2023-02-10 MED ORDER — SODIUM CHLORIDE 0.9 % IV SOLN
2.0000 g | INTRAVENOUS | Status: DC
  Administered 2023-02-10 – 2023-02-12 (×3): 2 g via INTRAVENOUS
  Filled 2023-02-10 (×4): qty 20

## 2023-02-10 MED ORDER — ONDANSETRON 4 MG PO TBDP
4.0000 mg | ORAL_TABLET | Freq: Four times a day (QID) | ORAL | Status: DC | PRN
  Administered 2023-02-11 – 2023-02-12 (×4): 4 mg via ORAL
  Filled 2023-02-10 (×4): qty 1

## 2023-02-10 MED ORDER — MORPHINE SULFATE (PF) 4 MG/ML IV SOLN
4.0000 mg | INTRAVENOUS | Status: AC | PRN
  Administered 2023-02-10: 4 mg via INTRAVENOUS
  Filled 2023-02-10: qty 1

## 2023-02-10 NOTE — Progress Notes (Signed)
 Patient to ultrasound at this time.

## 2023-02-10 NOTE — OB Triage Note (Signed)
 Patient is seen At [facility] is 21w on Saturday. Is G4P1. Patient was seen yesterday by Boyce HOWARD for a UTI. Today pt c/o worsening symptoms accompanied by vomiting early in the night, with severe right posterior kidney pain. Patient was sent home with ABX has not had time to finish it. Patient is afebrile, but short of breath due to pain. FHR via doppler 150. Provider to be notified. UA sent

## 2023-02-10 NOTE — Progress Notes (Signed)
 ANTEPARTUM PROGRESS NOTE  Tiffany Andrews is a 28 y.o. [phone] at 105w6d who is admitted for pyelonephritis. Estimated Date of Delivery: 06/24/23  Length of Stay:  0 Days. Admitted 02/09/2023  Subjective: No complaints. Reports she is doing well and her pain is well controlled with PO and IV medication. Reports active fetal movement. Denies leakage of fluid, vaginal bleeding, and contractions.    Review of Systems  Constitutional: Negative.  Negative for diaphoresis, fever and malaise/fatigue.  Respiratory:  Negative for shortness of breath.   Cardiovascular: Negative.   Gastrointestinal:  Negative for abdominal pain, nausea and vomiting.  Genitourinary:  Positive for flank pain.  Musculoskeletal:  Negative for back pain.  Psychiatric/Behavioral: Negative.       Vitals:  BP (!) 107/54 (BP Location: Right Arm)   Pulse 82   Temp 98.7 F (37.1 C) (Oral)   Resp 18   Ht 5' 6 (1.676 m)   Wt 95 kg   LMP 07/06/2022 (Approximate)   SpO2 98%   BMI 33.80 kg/m  Physical Examination: Physical Exam Constitutional:      Appearance: Normal appearance.  HENT:     Head: Normocephalic.  Cardiovascular:     Rate and Rhythm: Normal rate.  Pulmonary:     Effort: Pulmonary effort is normal.  Abdominal:     Palpations: Abdomen is soft.     Tenderness: There is abdominal tenderness.     Comments: Gravid, c/w [redacted]w[redacted]d  Musculoskeletal:        General: Normal range of motion.     Cervical back: Normal range of motion.  Neurological:     Mental Status: She is alert and oriented to person, place, and time.  Skin:    General: Skin is warm and dry.  Psychiatric:        Mood and Affect: Mood normal.        Behavior: Behavior normal.        Thought Content: Thought content normal.        Judgment: Judgment normal.     Results for orders placed or performed during the hospital encounter of 02/09/23 (from the past 48 hours)  Urinalysis, Routine w reflex microscopic -Urine, Clean Catch     Status:  Abnormal   Collection Time: 02/10/23 12:15 AM  Result Value Ref Range   Color, Urine YELLOW (A) YELLOW   APPearance CLOUDY (A) CLEAR   Specific Gravity, Urine 1.016 1.005 - 1.030   pH 8.0 5.0 - 8.0   Glucose, UA NEGATIVE NEGATIVE mg/dL   Hgb urine dipstick SMALL (A) NEGATIVE   Bilirubin Urine NEGATIVE NEGATIVE   Ketones, ur 5 (A) NEGATIVE mg/dL   Protein, ur 899 (A) NEGATIVE mg/dL   Nitrite NEGATIVE NEGATIVE   Leukocytes,Ua LARGE (A) NEGATIVE   RBC / HPF 11-20 0 - 5 RBC/hpf   WBC, UA >50 0 - 5 WBC/hpf   Bacteria, UA RARE (A) NONE SEEN   Squamous Epithelial / HPF 21-50 0 - 5 /HPF   WBC Clumps PRESENT    Mucus PRESENT     Comment: Performed at [facility], [address] [place], [place] [address]  CBC     Status: Abnormal   Collection Time: 02/10/23  1:53 AM  Result Value Ref Range   WBC 13.6 (H) 4.0 - 10.5 K/uL   RBC 3.78 (L) 3.87 - 5.11 MIL/uL   Hemoglobin 11.3 (L) 12.0 - 15.0 g/dL   HCT 66.2 (L) 63.9 - 53.9 %   MCV 89.2  80.0 - 100.0 fL   MCH 29.9 26.0 - 34.0 pg   MCHC 33.5 30.0 - 36.0 g/dL   RDW 86.0 88.4 - 84.4 %   Platelets 217 150 - 400 K/uL   nRBC 0.0 0.0 - 0.2 %    Comment: Performed at [facility], [address] [place], [place] [address]  Comprehensive metabolic panel     Status: Abnormal   Collection Time: 02/10/23  1:53 AM  Result Value Ref Range   Sodium 135 135 - 145 mmol/L   Potassium 3.8 3.5 - 5.1 mmol/L   Chloride 103 98 - 111 mmol/L   CO2 22 22 - 32 mmol/L   Glucose, Bld 99 70 - 99 mg/dL    Comment: Glucose reference range applies only to samples taken after fasting for at least 8 hours.   BUN 6 6 - 20 mg/dL   Creatinine, Ser 9.52 0.44 - 1.00 mg/dL   Calcium  8.4 (L) 8.9 - 10.3 mg/dL   Total Protein 6.7 6.5 - 8.1 g/dL   Albumin 2.9 (L) 3.5 - 5.0 g/dL   AST 11 (L) 15 - 41 U/L   ALT 10 0 - 44 U/L   Alkaline Phosphatase 60 38 - 126 U/L   Total Bilirubin 0.6 0.0 - 1.2 mg/dL   GFR, Estimated >39 >39 mL/min    Comment:  (NOTE) Calculated using the CKD-EPI Creatinine Equation (2021)    Anion gap 10 5 - 15    Comment: Performed at [facility] [address], [place], [place] [address]  Brain natriuretic peptide     Status: None   Collection Time: 02/10/23  1:53 AM  Result Value Ref Range   B Natriuretic Peptide 43.2 0.0 - 100.0 pg/mL    Comment: Performed at [facility], [address], [place], [place] [address]    DG CHEST PORT 1 VIEW Result Date: 02/10/2023 CLINICAL DATA:  Shortness of breath. Pregnant patient at [redacted] weeks gestation. EXAM: PORTABLE CHEST 1 VIEW COMPARISON:  Radiographs 07/15/2022, CT 12/06/2022 FINDINGS: The cardiomediastinal contours are normal. The lungs are clear. Pulmonary vasculature is normal. No consolidation, pleural effusion, or pneumothorax. No acute osseous abnormalities are seen. IMPRESSION: No acute findings. Electronically Signed   By: Andrea Gasman M.D.   On: 02/10/2023 02:03   US  RENAL Result Date: 02/10/2023 CLINICAL DATA:  Pyelonephritis affecting pregnancy in second trimester. Right flank pain. EXAM: RENAL / URINARY TRACT ULTRASOUND COMPLETE COMPARISON:  Renal ultrasound 01/07/2023 FINDINGS: Right Kidney: Renal measurements: 11 x 5.1 x 5.8 cm = volume: 172 mL. Mild hydronephrosis, new from prior exam. No renal calculi. No focal lesion or stone. No renal fluid collection. No abnormal vascularity. Left Kidney: Renal measurements: 10.9 x 5.7 x 4.8 cm = volume: 157 mL. No hydronephrosis. No renal calculi. No focal lesion or stone. No renal fluid collection. No abnormal vascularity Bladder: Only minimally distended. Appears normal for degree of bladder distention. Other: Gravid uterus noted. IMPRESSION: 1. Moderate hydronephrosis is new from prior exam. This may be hydronephrosis of pregnancy. 2. No renal fluid collection. Electronically Signed   By: Andrea Gasman M.D.   On: 02/10/2023 02:00    Current scheduled medications  docusate sodium   100 mg Oral  Daily   prenatal multivitamin  1 tablet Oral Q1200    I have reviewed the patient's current medications.  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   Vasovagal syncope 07/07/2021   Low back pain 06/02/2021   Anxiety 04/01/2021   Bradycardia 04/01/2021   SVT (supraventricular tachycardia) (HCC) 04/01/2021   S/P cesarean section 09/27/2017   Obesity (BMI 30.0-34.9) 04/26/2017   Encounter for procreative genetic counseling 03/15/2017    PLAN: Antepartum  - Regular diet  - Activity as tolerated  - FHR by Doppler q8hrs   - Bathroom privileges  - Routine antenatal care  Pyelonephritis  - Afebrile; Tmax 98.3 at 0758 today (02/10/2023) - Rocephin  IV q 24 hours for 24 hours  - PO Tylenol  and IV Fentanyl  prn for pain management - Reports flank pain is well managed with PO and IV pain medications.  - Labs as follows (02/10/2023):  - WBC: 13.6  - Hgb: 11.3  - RBC: 3.78  - Platelets: 217  - AST/ALT: 11/10  - UA: negative for nitrites, protein and ketones present  -Urine Culture collected; Results: Pending   - Renal US  complete (02/10/2023) --> moderate hydronephrosis which may indicate hydronephrosis of pregnancy  SOB - Continuous pulse oximetry - Chest x-ray complete (02/10/2023)--> WNL  - BNP: 43.2 (02/10/2023) Continue routine antenatal care.  Mccauley Diehl, CNM 02/10/2023 12:58 PM  Anatole Apollo Certified Nurse Midwife [facility]
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
## 2023-02-11 DIAGNOSIS — D509 Iron deficiency anemia, unspecified: Secondary | ICD-10-CM | POA: Diagnosis present

## 2023-02-11 DIAGNOSIS — Z2839 Other underimmunization status: Secondary | ICD-10-CM | POA: Insufficient documentation

## 2023-02-11 DIAGNOSIS — O09899 Supervision of other high risk pregnancies, unspecified trimester: Secondary | ICD-10-CM | POA: Insufficient documentation

## 2023-02-11 LAB — CBC
HCT: 28 % — ABNORMAL LOW (ref 36.0–46.0)
Hemoglobin: 9.3 g/dL — ABNORMAL LOW (ref 12.0–15.0)
MCH: 30.2 pg (ref 26.0–34.0)
MCHC: 33.2 g/dL (ref 30.0–36.0)
MCV: 90.9 fL (ref 80.0–100.0)
Platelets: 179 10*3/uL (ref 150–400)
RBC: 3.08 MIL/uL — ABNORMAL LOW (ref 3.87–5.11)
RDW: 14.5 % (ref 11.5–15.5)
WBC: 7.6 10*3/uL (ref 4.0–10.5)
nRBC: 0 % (ref 0.0–0.2)

## 2023-02-11 LAB — URINE CULTURE: Culture: <10000 — AB

## 2023-02-11 MED ORDER — FENTANYL CITRATE PF 50 MCG/ML IJ SOSY: 25.0000 ug | PREFILLED_SYRINGE | INTRAMUSCULAR | Status: DC | PRN

## 2023-02-11 MED ORDER — MORPHINE SULFATE (PF) 2 MG/ML IV SOLN
2.0000 mg | INTRAVENOUS | Status: DC | PRN
  Administered 2023-02-11: 2 mg via INTRAVENOUS
  Filled 2023-02-11: qty 1

## 2023-02-11 MED ORDER — OXYCODONE HCL 5 MG PO TABS
5.0000 mg | ORAL_TABLET | Freq: Four times a day (QID) | ORAL | Status: DC | PRN
  Administered 2023-02-11 – 2023-02-12 (×3): 5 mg via ORAL
  Filled 2023-02-11 (×3): qty 1

## 2023-02-11 NOTE — Progress Notes (Signed)
 ANTEPARTUM PROGRESS NOTE  Tiffany Andrews is a 28 y.o. [phone] at [redacted]w[redacted]d who is admitted for pyelonephritis. Estimated Date of Delivery: 06/24/23  Length of Stay:  1 Days. Admitted 02/09/2023  Subjective: No complaints. Reports she is doing well and her pain is well controlled with PO and IV medication. Reports active fetal movement. Denies leakage of fluid, vaginal bleeding, and contractions. Reports feeling overall better since admission.    Review of Systems  Constitutional: Negative.  Negative for diaphoresis, fever and malaise/fatigue.  Respiratory:  Negative for shortness of breath.   Cardiovascular: Negative.   Gastrointestinal:  Negative for abdominal pain, nausea and vomiting.  Genitourinary:  Positive for flank pain (pain improving since admission but mildly present today).  Musculoskeletal:  Negative for back pain.  Psychiatric/Behavioral: Negative.       Vitals:  BP 119/64 (BP Location: Right Arm)   Pulse 87   Temp 98.4 F (36.9 C) (Oral)   Resp 16   Ht 5' 6 (1.676 m)   Wt 95 kg   LMP 07/06/2022 (Approximate)   SpO2 100%   BMI 33.80 kg/m  Physical Examination: Physical Exam Constitutional:      Appearance: Normal appearance.  HENT:     Head: Normocephalic.  Cardiovascular:     Rate and Rhythm: Normal rate.  Pulmonary:     Effort: Pulmonary effort is normal.  Abdominal:     Palpations: Abdomen is soft.     Tenderness: There is no abdominal tenderness.     Comments: Gravid, c/w [redacted] week gestation  Musculoskeletal:        General: Normal range of motion.     Cervical back: Normal range of motion.  Neurological:     Mental Status: She is alert and oriented to person, place, and time.  Skin:    General: Skin is warm and dry.  Psychiatric:        Mood and Affect: Mood normal.        Behavior: Behavior normal.        Thought Content: Thought content normal.        Judgment: Judgment normal.     Results for orders placed or performed during the hospital  encounter of 02/09/23 (from the past 48 hours)  Urinalysis, Routine w reflex microscopic -Urine, Clean Catch     Status: Abnormal   Collection Time: 02/10/23 12:15 AM  Result Value Ref Range   Color, Urine YELLOW (A) YELLOW   APPearance CLOUDY (A) CLEAR   Specific Gravity, Urine 1.016 1.005 - 1.030   pH 8.0 5.0 - 8.0   Glucose, UA NEGATIVE NEGATIVE mg/dL   Hgb urine dipstick SMALL (A) NEGATIVE   Bilirubin Urine NEGATIVE NEGATIVE   Ketones, ur 5 (A) NEGATIVE mg/dL   Protein, ur 899 (A) NEGATIVE mg/dL   Nitrite NEGATIVE NEGATIVE   Leukocytes,Ua LARGE (A) NEGATIVE   RBC / HPF 11-20 0 - 5 RBC/hpf   WBC, UA >50 0 - 5 WBC/hpf   Bacteria, UA RARE (A) NONE SEEN   Squamous Epithelial / HPF 21-50 0 - 5 /HPF   WBC Clumps PRESENT    Mucus PRESENT     Comment: Performed at [facility], [address] [place], [place] [address]  CBC     Status: Abnormal   Collection Time: 02/10/23  1:53 AM  Result Value Ref Range   WBC 13.6 (H) 4.0 - 10.5 K/uL   RBC 3.78 (L) 3.87 - 5.11 MIL/uL   Hemoglobin 11.3 (L) 12.0 -  15.0 g/dL   HCT 66.2 (L) 63.9 - 53.9 %   MCV 89.2 80.0 - 100.0 fL   MCH 29.9 26.0 - 34.0 pg   MCHC 33.5 30.0 - 36.0 g/dL   RDW 86.0 88.4 - 84.4 %   Platelets 217 150 - 400 K/uL   nRBC 0.0 0.0 - 0.2 %    Comment: Performed at [facility], [address] [place], [place] [address]  Comprehensive metabolic panel     Status: Abnormal   Collection Time: 02/10/23  1:53 AM  Result Value Ref Range   Sodium 135 135 - 145 mmol/L   Potassium 3.8 3.5 - 5.1 mmol/L   Chloride 103 98 - 111 mmol/L   CO2 22 22 - 32 mmol/L   Glucose, Bld 99 70 - 99 mg/dL    Comment: Glucose reference range applies only to samples taken after fasting for at least 8 hours.   BUN 6 6 - 20 mg/dL   Creatinine, Ser 9.52 0.44 - 1.00 mg/dL   Calcium  8.4 (L) 8.9 - 10.3 mg/dL   Total Protein 6.7 6.5 - 8.1 g/dL   Albumin 2.9 (L) 3.5 - 5.0 g/dL   AST 11 (L) 15 - 41 U/L   ALT 10 0 - 44 U/L   Alkaline  Phosphatase 60 38 - 126 U/L   Total Bilirubin 0.6 0.0 - 1.2 mg/dL   GFR, Estimated >39 >39 mL/min    Comment: (NOTE) Calculated using the CKD-EPI Creatinine Equation (2021)    Anion gap 10 5 - 15    Comment: Performed at [facility], [address] [place], [place] [address]  Brain natriuretic peptide     Status: None   Collection Time: 02/10/23  1:53 AM  Result Value Ref Range   B Natriuretic Peptide 43.2 0.0 - 100.0 pg/mL    Comment: Performed at [facility], [address] [place], [place] [address]  CBC     Status: Abnormal   Collection Time: 02/10/23  2:44 PM  Result Value Ref Range   WBC 8.8 4.0 - 10.5 K/uL   RBC 3.19 (L) 3.87 - 5.11 MIL/uL   Hemoglobin 9.8 (L) 12.0 - 15.0 g/dL   HCT 71.1 (L) 63.9 - 53.9 %   MCV 90.3 80.0 - 100.0 fL   MCH 30.7 26.0 - 34.0 pg   MCHC 34.0 30.0 - 36.0 g/dL   RDW 85.5 88.4 - 84.4 %   Platelets 212 150 - 400 K/uL   nRBC 0.0 0.0 - 0.2 %    Comment: Performed at [facility], [address] [place], [place] [address]  Comprehensive metabolic panel     Status: Abnormal   Collection Time: 02/10/23  2:44 PM  Result Value Ref Range   Sodium 133 (L) 135 - 145 mmol/L   Potassium 3.3 (L) 3.5 - 5.1 mmol/L   Chloride 100 98 - 111 mmol/L   CO2 22 22 - 32 mmol/L   Glucose, Bld 94 70 - 99 mg/dL    Comment: Glucose reference range applies only to samples taken after fasting for at least 8 hours.   BUN <5 (L) 6 - 20 mg/dL   Creatinine, Ser 9.37 0.44 - 1.00 mg/dL   Calcium  8.2 (L) 8.9 - 10.3 mg/dL   Total Protein 5.9 (L) 6.5 - 8.1 g/dL   Albumin 2.6 (L) 3.5 - 5.0 g/dL   AST 10 (L) 15 - 41 U/L   ALT 8 0 - 44 U/L  Alkaline Phosphatase 52 38 - 126 U/L   Total Bilirubin 0.6 0.0 - 1.2 mg/dL   GFR, Estimated >39 >39 mL/min    Comment: (NOTE) Calculated using the CKD-EPI Creatinine Equation (2021)    Anion gap 11 5 - 15    Comment: Performed at [facility], [address] [place], [place] [address]  CBC      Status: Abnormal   Collection Time: 02/11/23  5:05 AM  Result Value Ref Range   WBC 7.6 4.0 - 10.5 K/uL   RBC 3.08 (L) 3.87 - 5.11 MIL/uL   Hemoglobin 9.3 (L) 12.0 - 15.0 g/dL   HCT 71.9 (L) 63.9 - 53.9 %   MCV 90.9 80.0 - 100.0 fL   MCH 30.2 26.0 - 34.0 pg   MCHC 33.2 30.0 - 36.0 g/dL   RDW 85.4 88.4 - 84.4 %   Platelets 179 150 - 400 K/uL   nRBC 0.0 0.0 - 0.2 %    Comment: Performed at [facility], [address] [place], [place] [address]    DG CHEST PORT 1 VIEW Result Date: 02/10/2023 CLINICAL DATA:  Shortness of breath. Pregnant patient at [redacted] weeks gestation. EXAM: PORTABLE CHEST 1 VIEW COMPARISON:  Radiographs 07/15/2022, CT 12/06/2022 FINDINGS: The cardiomediastinal contours are normal. The lungs are clear. Pulmonary vasculature is normal. No consolidation, pleural effusion, or pneumothorax. No acute osseous abnormalities are seen. IMPRESSION: No acute findings. Electronically Signed   By: Andrea Gasman M.D.   On: 02/10/2023 02:03   US  RENAL Result Date: 02/10/2023 CLINICAL DATA:  Pyelonephritis affecting pregnancy in second trimester. Right flank pain. EXAM: RENAL / URINARY TRACT ULTRASOUND COMPLETE COMPARISON:  Renal ultrasound 01/07/2023 FINDINGS: Right Kidney: Renal measurements: 11 x 5.1 x 5.8 cm = volume: 172 mL. Mild hydronephrosis, new from prior exam. No renal calculi. No focal lesion or stone. No renal fluid collection. No abnormal vascularity. Left Kidney: Renal measurements: 10.9 x 5.7 x 4.8 cm = volume: 157 mL. No hydronephrosis. No renal calculi. No focal lesion or stone. No renal fluid collection. No abnormal vascularity Bladder: Only minimally distended. Appears normal for degree of bladder distention. Other: Gravid uterus noted. IMPRESSION: 1. Moderate hydronephrosis is new from prior exam. This may be hydronephrosis of pregnancy. 2. No renal fluid collection. Electronically Signed   By: Andrea Gasman M.D.   On: 02/10/2023 02:00    Current scheduled  medications  docusate sodium   100 mg Oral Daily   prenatal multivitamin  1 tablet Oral Q1200    I have reviewed the patient's current medications.  ASSESSMENT: Patient Active Problem List   Diagnosis Date Noted   Maternal varicella, non-immune 02/11/2023   Rubella non-immune status, antepartum 02/11/2023   Pyelonephritis affecting pregnancy 02/10/2023   UTI (urinary tract infection) 02/10/2023   Elevated liver enzymes 01/02/2023   Hyperemesis gravidarum 01/02/2023   Obesity affecting pregnancy in first trimester 01/02/2023   Depression affecting pregnancy in second trimester, antepartum 01/02/2023   History of cesarean section 01/02/2023   Encounter for supervision of high risk pregnancy in first trimester, antepartum 12/05/2022   PAF (paroxysmal atrial fibrillation) (HCC) 01/13/2022   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  - Labs as follows:   - WBC: 13.6 > 8.8 > 7.6  - Platelets: 217  - AST/ALT: 11/10  - UA: negative for nitrites, protein and ketones present  - Urine Culture collected; Results: Pending   - Renal US  complete (02/10/2023) --> moderate hydronephrosis which may indicate hydronephrosis of pregnancy  SOB  - Symptoms improving  - Chest x-ray complete (02/10/2023)--> WNL  - BNP: 43.2 (02/10/2023)  4. Iron  deficiency anemia in pregnancy   - IV iron  transfusion given today   - Will start on PO iron  supplements   - Labs as follows:   - Hgb: 11.3 >  9.8 > IV iron  > 9.3  Disposition: continue inpatient antepartum care, possible discharge tomorrow.   Therisa CHRISTELLA Pillow, CNM 02/11/2023 9:54 AM  Therisa Pillow, CNM  Certified Nurse Midwife [facility]

## 2023-02-12 MED ORDER — ACETAMINOPHEN 500 MG PO TABS: 1000.0000 mg | ORAL_TABLET | Freq: Four times a day (QID) | ORAL | Status: DC | PRN

## 2023-02-12 MED ORDER — SODIUM CHLORIDE 0.9 % IV SOLN: INTRAVENOUS | Status: DC | PRN

## 2023-02-12 MED ORDER — AMOXICILLIN-POT CLAVULANATE 875-125 MG PO TABS: 1.0000 | ORAL_TABLET | Freq: Two times a day (BID) | ORAL | 0 refills | Status: AC

## 2023-02-12 MED ORDER — OXYCODONE HCL 5 MG PO TABS: 5.0000 mg | ORAL_TABLET | Freq: Four times a day (QID) | ORAL | 0 refills | Status: AC | PRN

## 2023-02-12 NOTE — Progress Notes (Signed)
 Patient discharged home with family.  Discharge instructions, when to follow up, and medications reviewed with patient.  Patient verbalized understanding. Patient will be escorted out by staff when ride comes later this morning.
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
## 2023-03-04 ENCOUNTER — Other Ambulatory Visit: Payer: Self-pay

## 2023-03-04 DIAGNOSIS — O26892 Other specified pregnancy related conditions, second trimester: Secondary | ICD-10-CM | POA: Insufficient documentation

## 2023-03-04 DIAGNOSIS — Z3A24 24 weeks gestation of pregnancy: Secondary | ICD-10-CM | POA: Insufficient documentation

## 2023-03-04 DIAGNOSIS — M79604 Pain in right leg: Secondary | ICD-10-CM | POA: Insufficient documentation

## 2023-03-04 NOTE — ED Triage Notes (Signed)
Pt to ed from home via POV for leg cramp in her right calf. She called her OBGYN and they wanted her checked for a possible blood clot of her leg. Pt is caox4, in no acute distress and ambulatory in triage.

## 2023-03-04 NOTE — ED Notes (Signed)
BLUE top sent down

## 2023-03-05 ENCOUNTER — Emergency Department
Admission: EM | Admit: 2023-03-05 | Discharge: 2023-03-05 | Disposition: A | Discharge status: Home / Self Care | Payer: Medicaid Other | Attending: Emergency Medicine | Admitting: Emergency Medicine

## 2023-03-05 ENCOUNTER — Emergency Department: Payer: Medicaid Other

## 2023-03-05 DIAGNOSIS — M79661 Pain in right lower leg: Secondary | ICD-10-CM

## 2023-03-05 LAB — CBC WITH DIFFERENTIAL/PLATELET
Abs Immature Granulocytes: 0.07 10*3/uL (ref 0.00–0.07)
Basophils Absolute: 0 10*3/uL (ref 0.0–0.1)
Basophils Relative: 0 %
Eosinophils Absolute: 0.1 10*3/uL (ref 0.0–0.5)
Eosinophils Relative: 1 %
HCT: 33.6 % — ABNORMAL LOW (ref 36.0–46.0)
Hemoglobin: 11.4 g/dL — ABNORMAL LOW (ref 12.0–15.0)
Immature Granulocytes: 1 %
Lymphocytes Relative: 21 %
Lymphs Abs: 2.5 10*3/uL (ref 0.7–4.0)
MCH: 30.9 pg (ref 26.0–34.0)
MCHC: 33.9 g/dL (ref 30.0–36.0)
MCV: 91.1 fL (ref 80.0–100.0)
Monocytes Absolute: 0.8 10*3/uL (ref 0.1–1.0)
Monocytes Relative: 7 %
Neutro Abs: 8.2 10*3/uL — ABNORMAL HIGH (ref 1.7–7.7)
Neutrophils Relative %: 70 %
Platelets: 263 10*3/uL (ref 150–400)
RBC: 3.69 MIL/uL — ABNORMAL LOW (ref 3.87–5.11)
RDW: 14.6 % (ref 11.5–15.5)
WBC: 11.7 10*3/uL — ABNORMAL HIGH (ref 4.0–10.5)
nRBC: 0 % (ref 0.0–0.2)

## 2023-03-05 LAB — BASIC METABOLIC PANEL
Anion gap: 10 (ref 5–15)
BUN: 5 mg/dL — ABNORMAL LOW (ref 6–20)
CO2: 18 mmol/L — ABNORMAL LOW (ref 22–32)
Calcium: 8.6 mg/dL — ABNORMAL LOW (ref 8.9–10.3)
Chloride: 106 mmol/L (ref 98–111)
Creatinine, Ser: 0.41 mg/dL — ABNORMAL LOW (ref 0.44–1.00)
GFR, Estimated: >60 mL/min (ref >60)
Glucose, Bld: 77 mg/dL (ref 70–99)
Potassium: 3.8 mmol/L (ref 3.5–5.1)
Sodium: 134 mmol/L — ABNORMAL LOW (ref 135–145)

## 2023-03-05 NOTE — Discharge Instructions (Signed)
Your workup in the Emergency Department today was reassuring.  We did not find any specific abnormalities.  We recommend you drink plenty of fluids, take your regular medications and/or any new ones prescribed today, and follow up with the doctor(s) listed in these documents as recommended.  Return to the Emergency Department if you develop new or worsening symptoms that concern you.

## 2023-03-05 NOTE — ED Provider Notes (Signed)
[facility] Provider Note    Event Date/Time   First MD Initiated Contact with Patient 03/05/23 0120     (approximate)   History   Leg Pain (Right - calf)   HPI Luca M Gowens is a 28 y.o. female who reports that she is about [redacted] weeks pregnant and presents for evaluation of pain in her right calf.  She said that she is having some pain in the calf and she called the OB nurse line who told her to come to the emergency department to make sure she does not have a blood clot.  Of note, the patient reports she is already on blood thinners because of a nonspecific cardiology issue " that might be POTS".  She has no pain other than some aching and occasionally sharp pain in her right calf.  No swelling.  No numbness or tingling.  No recent trauma.  No shortness of breath or chest pain.  No abdominal pain.  No vaginal bleeding.     Physical Exam   Triage Vital Signs: ED Triage Vitals  Encounter Vitals Group     BP 03/04/23 2355 (!) 140/82     Systolic BP Percentile --      Diastolic BP Percentile --      Pulse Rate 03/04/23 2355 100     Resp 03/04/23 2355 18     Temp 03/04/23 2355 98.7 F (37.1 C)     Temp src --      SpO2 03/04/23 2355 100 %     Weight 03/04/23 2355 95 kg (209 lb 7 oz)     Height 03/04/23 2355 1.676 m (5\' 6" )     Head Circumference --      Peak Flow --      Pain Score 03/04/23 2355 3     Pain Loc --      Pain Education --      Exclude from Growth Chart --     Most recent vital signs: Vitals:   03/04/23 2355 03/05/23 0215  BP: (!) 140/82 (!) 144/82  Pulse: 100 98  Resp: 18 18  Temp: 98.7 F (37.1 C) 98.6 F (37 C)  SpO2: 100% 100%    General: Awake, no distress.  CV:  Good peripheral perfusion including in her right lower leg and foot. Resp:  Normal effort. Speaking easily and comfortably, no accessory muscle usage nor intercostal retractions.   Other:  Some tenderness to palpation and manipulation of her right calf muscle but  it is soft, no cramping, compartments are soft and easily compressible.  Normal range of motion and strength.   ED Results / Procedures / Treatments   Labs (all labs ordered are listed, but only abnormal results are displayed) Labs Reviewed  CBC WITH DIFFERENTIAL/PLATELET - Abnormal; Notable for the following components:      Result Value   WBC 11.7 (*)    RBC 3.69 (*)    Hemoglobin 11.4 (*)    HCT 33.6 (*)    Neutro Abs 8.2 (*)    All other components within normal limits  BASIC METABOLIC PANEL - Abnormal; Notable for the following components:   Sodium 134 (*)    CO2 18 (*)    BUN 5 (*)    Creatinine, Ser 0.41 (*)    Calcium 8.6 (*)    All other components within normal limits      RADIOLOGY I viewed and interpreted the patient's ultrasound and there is no evidence  of DVT.   PROCEDURES:  Critical Care performed: No  Procedures    IMPRESSION / MDM / ASSESSMENT AND PLAN / ED COURSE  I reviewed the triage vital signs and the nursing notes.                              Differential diagnosis includes, but is not limited to, muscle cramp, nonspecific musculoskeletal strain, DVT.  Patient's presentation is most consistent with acute presentation with potential threat to life or bodily function.  Labs/studies ordered: Basic metabolic panel, CBC, venous ultrasound with Doppler  Interventions/Medications given:  Medications - No data to display  (Note:  hospital course my include additional interventions and/or labs/studies not listed above.)   Stable vitals, reassuring physical exam, negative ultrasound, essentially normal labs.  Patient is very comfortable with the plan to go home and in fact wants to go home as soon as possible.  I think that is very reasonable.  We talked about outpatient pain management and follow-up with her regular doctor.  I gave my usual and customary return precautions.         FINAL CLINICAL IMPRESSION(S) / ED DIAGNOSES   Final  diagnoses:  Right calf pain     Rx / DC Orders   ED Discharge Orders     None        Note:  This document was prepared using Dragon voice recognition software and may include unintentional dictation errors.   Loleta Rose, MD 03/05/23 [phone]

## 2023-03-06 ENCOUNTER — Other Ambulatory Visit: Payer: Self-pay | Admitting: Maternal & Fetal Medicine

## 2023-03-06 ENCOUNTER — Other Ambulatory Visit: Payer: Self-pay

## 2023-03-06 ENCOUNTER — Ambulatory Visit: Payer: Medicaid Other | Admitting: Maternal & Fetal Medicine

## 2023-03-06 ENCOUNTER — Ambulatory Visit: Payer: Medicaid Other | Attending: Obstetrics and Gynecology

## 2023-03-06 DIAGNOSIS — I480 Paroxysmal atrial fibrillation: Secondary | ICD-10-CM | POA: Diagnosis not present

## 2023-03-06 DIAGNOSIS — O24410 Gestational diabetes mellitus in pregnancy, diet controlled: Secondary | ICD-10-CM | POA: Insufficient documentation

## 2023-03-06 DIAGNOSIS — O99212 Obesity complicating pregnancy, second trimester: Secondary | ICD-10-CM | POA: Insufficient documentation

## 2023-03-06 DIAGNOSIS — I499 Cardiac arrhythmia, unspecified: Secondary | ICD-10-CM | POA: Diagnosis not present

## 2023-03-06 DIAGNOSIS — R030 Elevated blood-pressure reading, without diagnosis of hypertension: Secondary | ICD-10-CM | POA: Diagnosis not present

## 2023-03-06 DIAGNOSIS — O99891 Other specified diseases and conditions complicating pregnancy: Secondary | ICD-10-CM | POA: Diagnosis present

## 2023-03-06 DIAGNOSIS — Q249 Congenital malformation of heart, unspecified: Secondary | ICD-10-CM | POA: Diagnosis present

## 2023-03-06 DIAGNOSIS — E669 Obesity, unspecified: Secondary | ICD-10-CM

## 2023-03-06 DIAGNOSIS — Z3A24 24 weeks gestation of pregnancy: Secondary | ICD-10-CM

## 2023-03-06 DIAGNOSIS — I471 Supraventricular tachycardia, unspecified: Secondary | ICD-10-CM | POA: Diagnosis not present

## 2023-03-06 DIAGNOSIS — O99412 Diseases of the circulatory system complicating pregnancy, second trimester: Secondary | ICD-10-CM

## 2023-03-06 DIAGNOSIS — R519 Headache, unspecified: Secondary | ICD-10-CM | POA: Insufficient documentation

## 2023-03-06 DIAGNOSIS — O99413 Diseases of the circulatory system complicating pregnancy, third trimester: Secondary | ICD-10-CM

## 2023-03-06 DIAGNOSIS — Z362 Encounter for other antenatal screening follow-up: Secondary | ICD-10-CM | POA: Insufficient documentation

## 2023-03-06 DIAGNOSIS — N858 Other specified noninflammatory disorders of uterus: Secondary | ICD-10-CM | POA: Diagnosis not present

## 2023-03-06 DIAGNOSIS — O34211 Maternal care for low transverse scar from previous cesarean delivery: Secondary | ICD-10-CM | POA: Insufficient documentation

## 2023-03-06 NOTE — Progress Notes (Signed)
Patient information  Patient Name: Tiffany Andrews  Patient MRN:   [id number]  Referring practice: [facility] Referring Provider: [facility]  MFM CONSULT  Tiffany Andrews is a 28 y.o. [phone] at [redacted]w[redacted]d here for ultrasound and consultation. Patient Active Problem List   Diagnosis Date Noted   Maternal varicella, non-immune 02/11/2023   Rubella non-immune status, antepartum 02/11/2023   Maternal iron deficiency anemia affecting pregnancy in second trimester, antepartum 02/11/2023   Pyelonephritis affecting pregnancy 02/10/2023   UTI (urinary tract infection) 02/10/2023   Elevated liver enzymes 01/02/2023   Hyperemesis gravidarum 01/02/2023   Obesity affecting pregnancy in first trimester 01/02/2023   Depression affecting pregnancy in second trimester, antepartum 01/02/2023   History of cesarean section 01/02/2023   Encounter for supervision of high risk pregnancy in first trimester, antepartum 12/05/2022   PAF (paroxysmal atrial fibrillation) (HCC) 01/13/2022   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 She denies contractions, bleeding, or loss of fluid and reports good fetal movement.   RE headache/elevated BP: Patient reports a history of headaches starting around 15 weeks this pregnancy.  She reports her blood pressure was high at that time but has not been high since.  Today her blood pressure is normal despite having a headache.  I discussed the signs and symptoms and diagnostic criteria for preeclampsia.  She is going to buy a blood pressure cuff and have it calibrated at the clinic.  She also knows to go to the ER if her headache does not improve with Tylenol or her blood pressure is high.  I discussed the goal of 140/90 or less.   RE gestational diabetes: Due to hyperemesis gravidarum the patient was unable to  tolerate a glucose tolerance test but was checking blood sugars multiple times a day and they were enough blood sugars are abnormal to be considered diabetic.  She reports that over 50% having well-controlled with diet.  We discussed the importance of proper glycemic control in pregnancy.  RE maternal history of paroxysmal atrial fibrillation and SVT: Patient reports that during pregnancy she had a 7-day Holter monitor that was normal.  Currently she has no palpitations or chest pain but has shortness of breath that been persistent the entire pregnancy.  She recently was seen in the ER for leg pain and DVT was ruled out.  She has no other new concerns symptoms at this time.  Sonographic findings Single intrauterine pregnancy at 24w 2d.  Fetal cardiac activity:  Observed and appears normal. Presentation: Cephalic. Interval fetal anatomy appears normal. Fetal biometry shows the estimated fetal weight at the 60 percentile. Amniotic fluid volume: Within normal limits. MVP: 6.34 cm. Placenta: Anterior.  There are limitations of prenatal ultrasound such as the inability to detect certain abnormalities due to poor visualization. Various factors such as fetal position, gestational age and maternal body habitus may increase the difficulty in visualizing the fetal anatomy.    Recommendations -Continue serial growth ultrasounds -Recommend obtaining blood pressure cuff for at home blood pressure monitoring.  Preeclampsia signs and symptoms discussed. -Continue glucose monitoring.  Medication is reserved for when greater than than 50% of glucose values are abnormal. -Continue to follow-up with cardiology as needed.  Review of Systems: A review of systems was performed and was negative except per HPI   Vitals and Physical Exam  03/06/2023    3:39 PM 03/05/2023    2:15 AM 03/04/2023   11:55 PM  Vitals with BMI  Height 5\' 8"   5\' 6"   Weight 213 lbs 8 oz  209 lbs 7 oz  BMI 32.47  33.82  Systolic 126  144 140  Diastolic 83 82 82  Pulse 94 98 100   Sitting comfortably on the sonogram table Nonlabored breathing Normal rate and rhythm Abdomen is nontender  Past pregnancies OB History  Gravida Para Term Preterm AB Living  4 1 1  2 1   SAB IAB Ectopic Multiple Live Births  2   0 1    # Outcome Date GA Lbr Len/2nd Weight Sex Type Anes PTL Lv  4 Current           3 SAB 01/18/22 [redacted]w[redacted]d         2 SAB 04/2021 [redacted]w[redacted]d         1 Term 09/27/17 [redacted]w[redacted]d  3900 g M CS-LTranv Spinal  LIV    I spent 20 minutes reviewing the patients chart, including labs and images as well as counseling the patient about her medical conditions. Greater than 50% of the time was spent in direct face-to-face patient counseling.  [name]  MFM, [facility]   03/06/2023  4:51 PM

## 2023-03-14 ENCOUNTER — Observation Stay: Payer: Medicaid Other

## 2023-03-14 ENCOUNTER — Observation Stay
Admission: EM | Admit: 2023-03-14 | Discharge: 2023-03-14 | Disposition: A | Discharge status: Home / Self Care | Payer: Medicaid Other | Attending: Obstetrics and Gynecology | Admitting: Obstetrics and Gynecology

## 2023-03-14 DIAGNOSIS — O26892 Other specified pregnancy related conditions, second trimester: Secondary | ICD-10-CM | POA: Diagnosis present

## 2023-03-14 DIAGNOSIS — Z3A25 25 weeks gestation of pregnancy: Secondary | ICD-10-CM | POA: Diagnosis not present

## 2023-03-14 DIAGNOSIS — Z91040 Latex allergy status: Secondary | ICD-10-CM | POA: Insufficient documentation

## 2023-03-14 DIAGNOSIS — O26899 Other specified pregnancy related conditions, unspecified trimester: Principal | ICD-10-CM | POA: Diagnosis present

## 2023-03-14 DIAGNOSIS — O4702 False labor before 37 completed weeks of gestation, second trimester: Secondary | ICD-10-CM | POA: Diagnosis not present

## 2023-03-14 DIAGNOSIS — R102 Pelvic and perineal pain unspecified side: Secondary | ICD-10-CM | POA: Diagnosis present

## 2023-03-14 LAB — URINALYSIS, COMPLETE (UACMP) WITH MICROSCOPIC
Bilirubin Urine: NEGATIVE
Glucose, UA: NEGATIVE mg/dL
Hgb urine dipstick: NEGATIVE
Ketones, ur: NEGATIVE mg/dL
Nitrite: NEGATIVE
Protein, ur: NEGATIVE mg/dL
Specific Gravity, Urine: 1.017 (ref 1.005–1.030)
pH: 6 (ref 5.0–8.0)

## 2023-03-14 LAB — WET PREP, GENITAL
Clue Cells Wet Prep HPF POC: NONE SEEN
Sperm: NONE SEEN
Trich, Wet Prep: NONE SEEN
WBC, Wet Prep HPF POC: <10 (ref <10)
Yeast Wet Prep HPF POC: NONE SEEN

## 2023-03-14 NOTE — Discharge Summary (Signed)
 Patient ID: ARILYNN BLAKENEY MRN: [id number] DOB/AGE: [birth year] 27 y.o.  Admit date: 03/14/2023 Discharge date: 03/14/2023  Admission Diagnoses: 28 yo G4P1 at [redacted]w[redacted]d presents with pelvic pressure for the last 2 weeks and complaints of UCs since 1700.  History of c/s at 39 weeks for breech presentation.    Discharge Diagnoses: Braxton hicks with occasional UCs  Factors complicating pregnancy: Admission for Pyelo (02/09/23) Early GDM at 16w - per DVS based on BS log Hx of Ectopic pregnancy 08/24 Previous cesarean section Cardiac history: Atrial fibrillation, SVT Depression - Hx of severe depression and postpartum depression Obesity BMI: 34  Prenatal Procedures: NST and u/s  Consults: None   Significant Diagnostic Studies:  Results for orders placed or performed during the hospital encounter of 03/14/23 (from the past week)  Urinalysis, Complete w Microscopic -Urine, Clean Catch   Collection Time: 03/14/23  9:42 PM  Result Value Ref Range   Color, Urine YELLOW (A) YELLOW   APPearance CLOUDY (A) CLEAR   Specific Gravity, Urine 1.017 1.005 - 1.030   pH 6.0 5.0 - 8.0   Glucose, UA NEGATIVE NEGATIVE mg/dL   Hgb urine dipstick NEGATIVE NEGATIVE   Bilirubin Urine NEGATIVE NEGATIVE   Ketones, ur NEGATIVE NEGATIVE mg/dL   Protein, ur NEGATIVE NEGATIVE mg/dL   Nitrite NEGATIVE NEGATIVE   Leukocytes,Ua SMALL (A) NEGATIVE   RBC / HPF 0-5 0 - 5 RBC/hpf   WBC, UA 6-10 0 - 5 WBC/hpf   Bacteria, UA RARE (A) NONE SEEN   Squamous Epithelial / HPF 21-50 0 - 5 /HPF   Mucus PRESENT   Wet prep, genital   Collection Time: 03/14/23 10:20 PM   Specimen: Urine, Clean Catch  Result Value Ref Range   Yeast Wet Prep HPF POC NONE SEEN NONE SEEN   Trich, Wet Prep NONE SEEN NONE SEEN   Clue Cells Wet Prep HPF POC NONE SEEN NONE SEEN   WBC, Wet Prep HPF POC <10 <10   Sperm NONE SEEN     Treatments: none  Hospital Course:  This is a 28 y.o. [id number] with IUP at [redacted]w[redacted]d seen for pelvic pressure and possible  UCs. Speculum exam showed a closed cervix.  Wet prep and UA collected - results above.  She was sent for an u/s -  FINDINGS: Number of Fetuses: 1 Heart Rate:  141 bpm Movement: Yes Presentation: Cephalic Placental Location: Anterior Previa: No Amniotic Fluid (Subjective):  Within normal limits. BPD: 6.75 cm 27 w  1 d MATERNAL FINDINGS: Cervix:  Appears closed.  3.1 cm. Uterus/Adnexae: No abnormality visualized. IMPRESSION: Single live intrauterine pregnancy with gestational age by ultrasound of 27 weeks and 1 day.  She was observed, fetal heart rate monitoring remained reassuring, and she had no signs/symptoms of preterm labor or other maternal-fetal concerns.  She was deemed stable for discharge to home with outpatient follow up.  Discharge Physical Exam:  BP 119/72   Pulse 97   Temp 98.3 F (36.8 C) (Oral)   Resp 16   LMP 07/06/2022 (Approximate)   General: NAD CV: RRR Pulm: CTABL, nl effort ABD: s/nd/nt, gravid DVT Evaluation: LE non-ttp, no evidence of DVT on exam.  NST: FHR baseline: 140 bpm Variability: moderate Accelerations: yes 10x10s Decelerations: none Category/reactivity: reactive  TOCO: Occasional  SVE: deferred      Discharge Condition: Stable  Disposition:  Discharge disposition: 01-Home or Self Care        Allergies as of 03/14/2023       Reactions  Bee Venom Hives, Itching   Iodinated Contrast Media Shortness Of Breath, Nausea Only   Pt experienced nausea, severe facial tingling/swelling, feeling as though throat was tight and closing up, difficulty breathing, chest pressure and tightness    Other Anaphylaxis, Swelling, Other (See Comments)   Stinging insects   Shellfish Allergy Anaphylaxis, Swelling   Latex    Other reaction(s): Unknown   Hydromorphone  Anxiety, Itching        Medication List     TAKE these medications    acetaminophen  500 MG tablet Commonly known as: TYLENOL  Take 2 tablets (1,000 mg total) by mouth every 6  (six) hours as needed for mild pain (pain score 1-3), moderate pain (pain score 4-6) or fever.   EPINEPHrine  0.3 mg/0.3 mL Soaj injection Commonly known as: EPI-PEN Inject 0.3 mg into the muscle as needed for anaphylaxis.   famotidine  20 MG tablet Commonly known as: PEPCID  Take 1 tablet (20 mg total) by mouth 2 (two) times daily.   Maalox Max 400-400-40 MG/5ML suspension Generic drug: alum & mag hydroxide-simeth Take 10 mLs by mouth every 6 (six) hours as needed for indigestion.   multivitamin-prenatal 27-0.8 MG Tabs tablet Take 1 tablet by mouth daily at 12 noon.   ondansetron  4 MG disintegrating tablet Commonly known as: ZOFRAN -ODT Take 1 tablet (4 mg total) by mouth every 8 (eight) hours as needed for nausea or vomiting.   sucralfate  1 GM/10ML suspension Commonly known as: Carafate  Take 10 mLs (1 g total) by mouth 4 (four) times daily.        Follow-up Information     [facility] Follow up.   Why: Keep all scheduled appointments Contact information: [address] [place]  [address] [phone]                Signed:  DELON COE, CNM 03/14/2023 11:38 PM

## 2023-03-14 NOTE — OB Triage Note (Signed)
Pt  is a G4P1 at 25.3w with c/o UC iincreasing since 5pm and pelvic pressure x 2 weeks. Has a hx of PTL with severe hyperemesis. Pt stats she has hyperemesis this time also. Pt states she feels she has had good fluid intake.

## 2023-04-03 ENCOUNTER — Other Ambulatory Visit: Payer: Self-pay

## 2023-04-03 ENCOUNTER — Ambulatory Visit: Payer: Medicaid Other | Attending: Maternal & Fetal Medicine

## 2023-04-03 DIAGNOSIS — O99413 Diseases of the circulatory system complicating pregnancy, third trimester: Secondary | ICD-10-CM

## 2023-04-03 DIAGNOSIS — O99213 Obesity complicating pregnancy, third trimester: Secondary | ICD-10-CM | POA: Diagnosis not present

## 2023-04-03 DIAGNOSIS — I480 Paroxysmal atrial fibrillation: Secondary | ICD-10-CM | POA: Insufficient documentation

## 2023-04-03 DIAGNOSIS — O24410 Gestational diabetes mellitus in pregnancy, diet controlled: Secondary | ICD-10-CM | POA: Insufficient documentation

## 2023-04-03 DIAGNOSIS — E669 Obesity, unspecified: Secondary | ICD-10-CM

## 2023-04-03 DIAGNOSIS — Z3A28 28 weeks gestation of pregnancy: Secondary | ICD-10-CM

## 2023-04-03 DIAGNOSIS — I499 Cardiac arrhythmia, unspecified: Secondary | ICD-10-CM

## 2023-04-03 LAB — OB RESULTS CONSOLE RPR: RPR: NONREACTIVE

## 2023-04-03 LAB — OB RESULTS CONSOLE HIV ANTIBODY (ROUTINE TESTING): HIV: NONREACTIVE

## 2023-04-24 ENCOUNTER — Encounter: Payer: Self-pay | Admitting: Obstetrics and Gynecology

## 2023-04-24 ENCOUNTER — Observation Stay

## 2023-04-24 ENCOUNTER — Other Ambulatory Visit: Payer: Self-pay

## 2023-04-24 ENCOUNTER — Observation Stay
Admission: EM | Admit: 2023-04-24 | Discharge: 2023-04-26 | Disposition: A | Discharge status: Home / Self Care | Attending: Obstetrics and Gynecology | Admitting: Obstetrics and Gynecology

## 2023-04-24 DIAGNOSIS — O2300 Infections of kidney in pregnancy, unspecified trimester: Secondary | ICD-10-CM | POA: Diagnosis present

## 2023-04-24 DIAGNOSIS — Z3A31 31 weeks gestation of pregnancy: Secondary | ICD-10-CM | POA: Diagnosis not present

## 2023-04-24 DIAGNOSIS — F419 Anxiety disorder, unspecified: Secondary | ICD-10-CM | POA: Diagnosis present

## 2023-04-24 DIAGNOSIS — O479 False labor, unspecified: Principal | ICD-10-CM | POA: Diagnosis present

## 2023-04-24 DIAGNOSIS — Z91040 Latex allergy status: Secondary | ICD-10-CM | POA: Insufficient documentation

## 2023-04-24 DIAGNOSIS — R7309 Other abnormal glucose: Secondary | ICD-10-CM | POA: Insufficient documentation

## 2023-04-24 DIAGNOSIS — O09899 Supervision of other high risk pregnancies, unspecified trimester: Secondary | ICD-10-CM

## 2023-04-24 DIAGNOSIS — O4703 False labor before 37 completed weeks of gestation, third trimester: Secondary | ICD-10-CM | POA: Diagnosis present

## 2023-04-24 DIAGNOSIS — F32A Depression, unspecified: Secondary | ICD-10-CM | POA: Diagnosis present

## 2023-04-24 DIAGNOSIS — I471 Supraventricular tachycardia, unspecified: Secondary | ICD-10-CM | POA: Diagnosis present

## 2023-04-24 DIAGNOSIS — Z98891 History of uterine scar from previous surgery: Secondary | ICD-10-CM

## 2023-04-24 DIAGNOSIS — O99211 Obesity complicating pregnancy, first trimester: Secondary | ICD-10-CM | POA: Diagnosis present

## 2023-04-24 DIAGNOSIS — O99342 Other mental disorders complicating pregnancy, second trimester: Secondary | ICD-10-CM | POA: Diagnosis present

## 2023-04-24 DIAGNOSIS — Z2839 Supervision of other high risk pregnancies, unspecified trimester: Secondary | ICD-10-CM

## 2023-04-24 DIAGNOSIS — O4700 False labor before 37 completed weeks of gestation, unspecified trimester: Principal | ICD-10-CM | POA: Diagnosis present

## 2023-04-24 LAB — URINALYSIS, ROUTINE W REFLEX MICROSCOPIC
Bilirubin Urine: NEGATIVE
Glucose, UA: NEGATIVE mg/dL
Hgb urine dipstick: NEGATIVE
Ketones, ur: NEGATIVE mg/dL
Leukocytes,Ua: NEGATIVE
Nitrite: NEGATIVE
Protein, ur: NEGATIVE mg/dL
Specific Gravity, Urine: 1.02 (ref 1.005–1.030)
pH: 6 (ref 5.0–8.0)

## 2023-04-24 LAB — PROTEIN / CREATININE RATIO, URINE
Creatinine, Urine: 30 mg/dL
Total Protein, Urine: <6 mg/dL

## 2023-04-24 LAB — WET PREP, GENITAL
Clue Cells Wet Prep HPF POC: NONE SEEN
Trich, Wet Prep: NONE SEEN
WBC, Wet Prep HPF POC: <10 — AB (ref <10)
Yeast Wet Prep HPF POC: NONE SEEN

## 2023-04-24 LAB — GROUP B STREP BY PCR: Group B strep by PCR: NEGATIVE

## 2023-04-24 LAB — CBC
HCT: 32.6 % — ABNORMAL LOW (ref 36.0–46.0)
Hemoglobin: 11.4 g/dL — ABNORMAL LOW (ref 12.0–15.0)
MCH: 31.6 pg (ref 26.0–34.0)
MCHC: 35 g/dL (ref 30.0–36.0)
MCV: 90.3 fL (ref 80.0–100.0)
Platelets: 246 10*3/uL (ref 150–400)
RBC: 3.61 MIL/uL — ABNORMAL LOW (ref 3.87–5.11)
RDW: 13.2 % (ref 11.5–15.5)
WBC: 11.8 10*3/uL — ABNORMAL HIGH (ref 4.0–10.5)
nRBC: 0 % (ref 0.0–0.2)

## 2023-04-24 LAB — COMPREHENSIVE METABOLIC PANEL
ALT: 10 U/L (ref 0–44)
AST: 12 U/L — ABNORMAL LOW (ref 15–41)
Albumin: 2.7 g/dL — ABNORMAL LOW (ref 3.5–5.0)
Alkaline Phosphatase: 58 U/L (ref 38–126)
Anion gap: 5 (ref 5–15)
BUN: <5 mg/dL — ABNORMAL LOW (ref 6–20)
CO2: 24 mmol/L (ref 22–32)
Calcium: 8.8 mg/dL — ABNORMAL LOW (ref 8.9–10.3)
Chloride: 108 mmol/L (ref 98–111)
Creatinine, Ser: 0.48 mg/dL (ref 0.44–1.00)
GFR, Estimated: >60 mL/min (ref >60)
Glucose, Bld: 102 mg/dL — ABNORMAL HIGH (ref 70–99)
Potassium: 3.1 mmol/L — ABNORMAL LOW (ref 3.5–5.1)
Sodium: 137 mmol/L (ref 135–145)
Total Bilirubin: 0.5 mg/dL (ref 0.0–1.2)
Total Protein: 6 g/dL — ABNORMAL LOW (ref 6.5–8.1)

## 2023-04-24 LAB — TYPE AND SCREEN
ABO/RH(D): A POS
Antibody Screen: NEGATIVE

## 2023-04-24 MED ORDER — BETAMETHASONE SOD PHOS & ACET 6 (3-3) MG/ML IJ SUSP
12.0000 mg | INTRAMUSCULAR | Status: AC
  Administered 2023-04-24 – 2023-04-25 (×2): 12 mg via INTRAMUSCULAR
  Filled 2023-04-24 (×2): qty 5

## 2023-04-24 MED ORDER — TERBUTALINE SULFATE 1 MG/ML IJ SOLN
0.2500 mg | INTRAMUSCULAR | Status: AC
  Administered 2023-04-24: 0.25 mg via SUBCUTANEOUS
  Filled 2023-04-24: qty 1

## 2023-04-24 MED ORDER — ONDANSETRON HCL 4 MG/2ML IJ SOLN
4.0000 mg | Freq: Four times a day (QID) | INTRAMUSCULAR | Status: DC | PRN
  Administered 2023-04-24 – 2023-04-25 (×2): 4 mg via INTRAVENOUS
  Filled 2023-04-24 (×2): qty 2

## 2023-04-24 MED ORDER — LACTATED RINGERS IV SOLN: INTRAVENOUS | Status: AC

## 2023-04-24 MED ORDER — MAGNESIUM SULFATE 40 GM/1000ML IV SOLN
1.0000 g/h | INTRAVENOUS | Status: DC
  Administered 2023-04-24: 1 g/h via INTRAVENOUS
  Filled 2023-04-24: qty 1000

## 2023-04-24 MED ORDER — LACTATED RINGERS IV BOLUS
1000.0000 mL | Freq: Once | INTRAVENOUS | Status: AC
  Administered 2023-04-24: 1000 mL via INTRAVENOUS

## 2023-04-24 MED ORDER — ACETAMINOPHEN 500 MG PO TABS
1000.0000 mg | ORAL_TABLET | Freq: Four times a day (QID) | ORAL | Status: DC | PRN
  Administered 2023-04-24 (×2): 1000 mg via ORAL
  Filled 2023-04-24 (×2): qty 2

## 2023-04-24 MED ORDER — ONDANSETRON HCL 4 MG/2ML IJ SOLN: 4.0000 mg | Freq: Four times a day (QID) | INTRAMUSCULAR | Status: DC

## 2023-04-24 MED ORDER — TERBUTALINE SULFATE 1 MG/ML IJ SOLN
INTRAMUSCULAR | Status: AC
  Filled 2023-04-24: qty 1

## 2023-04-24 MED ORDER — ONDANSETRON HCL 4 MG/2ML IJ SOLN: 4.0000 mg | Freq: Once | INTRAMUSCULAR | Status: DC

## 2023-04-24 MED ORDER — TERBUTALINE SULFATE 1 MG/ML IJ SOLN
0.2500 mg | INTRAMUSCULAR | Status: AC
  Administered 2023-04-24: 0.25 mg via SUBCUTANEOUS

## 2023-04-24 MED ORDER — FAMOTIDINE IN NACL 20-0.9 MG/50ML-% IV SOLN
20.0000 mg | Freq: Two times a day (BID) | INTRAVENOUS | Status: DC
  Administered 2023-04-24 – 2023-04-25 (×3): 20 mg via INTRAVENOUS
  Filled 2023-04-24 (×4): qty 50

## 2023-04-24 MED ORDER — PRENATAL MULTIVITAMIN CH
1.0000 | ORAL_TABLET | Freq: Every day | ORAL | Status: DC
  Administered 2023-04-24 – 2023-04-26 (×3): 1 via ORAL
  Filled 2023-04-24 (×3): qty 1

## 2023-04-24 MED ORDER — CALCIUM CARBONATE ANTACID 500 MG PO CHEW
2.0000 | CHEWABLE_TABLET | ORAL | Status: DC | PRN
  Administered 2023-04-24 – 2023-04-26 (×2): 400 mg via ORAL
  Filled 2023-04-24 (×2): qty 2

## 2023-04-24 MED ORDER — ZOLPIDEM TARTRATE 5 MG PO TABS
5.0000 mg | ORAL_TABLET | Freq: Every evening | ORAL | Status: DC | PRN
  Administered 2023-04-24: 5 mg via ORAL
  Filled 2023-04-24: qty 1

## 2023-04-24 MED ORDER — FAMOTIDINE IN NACL 20-0.9 MG/50ML-% IV SOLN: 20.0000 mg | Freq: Two times a day (BID) | INTRAVENOUS | Status: DC

## 2023-04-24 MED ORDER — ONDANSETRON HCL 4 MG/2ML IJ SOLN
INTRAMUSCULAR | Status: AC
  Administered 2023-04-24: 4 mg
  Filled 2023-04-24: qty 2

## 2023-04-24 MED ORDER — MAGNESIUM SULFATE BOLUS VIA INFUSION
4.0000 g | Freq: Once | INTRAVENOUS | Status: AC
  Administered 2023-04-24: 4 g via INTRAVENOUS
  Filled 2023-04-24: qty 1000
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
## 2023-04-24 NOTE — Progress Notes (Signed)
 ANTEPARTUM PROGRESS NOTE  Tiffany Andrews is a 28 y.o. [phone] at [redacted]w[redacted]d who is admitted for Preterm labor.   Estimated Date of Delivery: 06/24/23  Length of Stay:  0 Days. Admitted 04/24/2023  Subjective: Pt is reporting increase in pressure, and she continues to feel contractions. She also reports a small clot when she voided.  She reports:  -active fetal movement -no leakage of fluid -no contractions  Vitals:  BP 116/72   Pulse (!) 107   Temp 98 F (36.7 C) (Oral)   Resp 18   Ht 5\' 8"  (1.727 m)   Wt 100 kg   LMP 07/06/2022 (Approximate)   SpO2 93%   BMI 33.51 kg/m  Physical Examination: General:   alert and cooperative  Skin:  normal  Neurologic:    Alert & oriented x 3  Lungs:    Nl effort  Heart:   regular rate and rhythm  Abdomen:  soft, non-tender; bowel sounds normal; no masses,  no organomegaly  Extremities: : non-tender, symmetric, mild edema bilaterally.      Fetal monitoring: FHR: 12 bpm, Variability: moderate, Accelerations: Present, Decelerations: Absent, Presentation: Vtx Uterine activity: some uterine irritability  Results for orders placed or performed during the hospital encounter of 04/24/23 (from the past 48 hours)  Urinalysis, Routine w reflex microscopic -Urine, Clean Catch     Status: Abnormal   Collection Time: 04/24/23  2:20 AM  Result Value Ref Range   Color, Urine YELLOW (A) YELLOW   APPearance HAZY (A) CLEAR   Specific Gravity, Urine 1.020 1.005 - 1.030   pH 6.0 5.0 - 8.0   Glucose, UA NEGATIVE NEGATIVE mg/dL   Hgb urine dipstick NEGATIVE NEGATIVE   Bilirubin Urine NEGATIVE NEGATIVE   Ketones, ur NEGATIVE NEGATIVE mg/dL   Protein, ur NEGATIVE NEGATIVE mg/dL   Nitrite NEGATIVE NEGATIVE   Leukocytes,Ua NEGATIVE NEGATIVE    Comment: Performed at [facility], [address] [place], [place] [address]  Wet prep, genital     Status: Abnormal   Collection Time: 04/24/23  2:20 AM   Specimen: Urine, Clean Catch  Result Value Ref Range    Yeast Wet Prep HPF POC NONE SEEN NONE SEEN   Trich, Wet Prep NONE SEEN NONE SEEN   Clue Cells Wet Prep HPF POC NONE SEEN NONE SEEN   WBC, Wet Prep HPF POC <10 (A) <10   Sperm PRESENT     Comment: Performed at [facility], [address] [place], [place] [address]  Protein / creatinine ratio, urine     Status: None   Collection Time: 04/24/23  4:55 AM  Result Value Ref Range   Creatinine, Urine 30 mg/dL   Total Protein, Urine <6 mg/dL    Comment: NO NORMAL RANGE ESTABLISHED FOR THIS TEST   Protein Creatinine Ratio        0.00 - 0.15 mg/mg[Cre]    Comment: RESULT BELOW REPORTABLE RANGE, UNABLE TO CALCULATE. Performed at [facility], [address] [place], [place] [address]   CBC     Status: Abnormal   Collection Time: 04/24/23  5:35 AM  Result Value Ref Range   WBC 11.8 (H) 4.0 - 10.5 K/uL   RBC 3.61 (L) 3.87 - 5.11 MIL/uL   Hemoglobin 11.4 (L) 12.0 - 15.0 g/dL   HCT 56.2 (L) 13.0 - 86.5 %   MCV 90.3 80.0 - 100.0 fL   MCH 31.6 26.0 - 34.0 pg   MCHC 35.0 30.0 - 36.0 g/dL  RDW 13.2 11.5 - 15.5 %   Platelets 246 150 - 400 K/uL   nRBC 0.0 0.0 - 0.2 %    Comment: Performed at [facility], [address] [place], [place] [address]  Type and screen     Status: None   Collection Time: 04/24/23  5:35 AM  Result Value Ref Range   ABO/RH(D) A POS    Antibody Screen NEG    Sample Expiration      04/27/2023,2359 Performed at [facility], [address] [place], [place] [address]   Comprehensive metabolic panel     Status: Abnormal   Collection Time: 04/24/23  5:35 AM  Result Value Ref Range   Sodium 137 135 - 145 mmol/L   Potassium 3.1 (L) 3.5 - 5.1 mmol/L   Chloride 108 98 - 111 mmol/L   CO2 24 22 - 32 mmol/L   Glucose, Bld 102 (H) 70 - 99 mg/dL    Comment: Glucose reference range applies only to samples taken after fasting for at least 8 hours.   BUN <5 (L) 6 - 20 mg/dL   Creatinine, Ser 4.01 0.44 - 1.00 mg/dL   Calcium 8.8  (L) 8.9 - 10.3 mg/dL   Total Protein 6.0 (L) 6.5 - 8.1 g/dL   Albumin 2.7 (L) 3.5 - 5.0 g/dL   AST 12 (L) 15 - 41 U/L   ALT 10 0 - 44 U/L   Alkaline Phosphatase 58 38 - 126 U/L   Total Bilirubin 0.5 0.0 - 1.2 mg/dL   GFR, Estimated >02 >72 mL/min    Comment: (NOTE) Calculated using the CKD-EPI Creatinine Equation (2021)    Anion gap 5 5 - 15    Comment: Performed at [facility], [address] [place], [place] [address]  Group B strep by PCR     Status: None   Collection Time: 04/24/23 11:37 AM   Specimen: Vaginal/Rectal; Genital  Result Value Ref Range   Group B strep by PCR PRESUMPTIVE NEGATIVE PRESUMPTIVE NEGATIVE    Comment: (NOTE) Intrapartum testing with Xpert Xpress GBS assay should be used as an adjunct to other methods available and not used to replace antepartum testing (at 35-[redacted] weeks gestation).  A GBS PRESUMPTIVE NEGATIVE should be interpreted as: Patient may or may not be colonized with GBS. A GBS presumptive negative result does not exclude the possibility of GBS colonization. Providers should consider new risk factors, if applicable, and clinical guidance regarding a role for intrapartum prophylaxis.  Recommend culture for confirmation of GBS colonization is clinically indicated.  Performed at [facility], [address] [place], [place] [address]     No results found.  Current scheduled medications  betamethasone acetate-betamethasone sodium phosphate  12 mg Intramuscular Q24 Hr x 2   prenatal multivitamin  1 tablet Oral Q1200    I have reviewed the patient's current medications.  ASSESSMENT: Patient Active Problem List   Diagnosis Date Noted   Uterine contractions 04/24/2023   Pelvic pressure in pregnancy 03/14/2023   Gestational diabetes, diet controlled 03/06/2023   Maternal varicella, non-immune 02/11/2023   Rubella non-immune status, antepartum 02/11/2023   Maternal iron deficiency anemia affecting pregnancy in second  trimester, antepartum 02/11/2023   Pyelonephritis affecting pregnancy 02/10/2023   UTI (urinary tract infection) 02/10/2023   Hyperemesis gravidarum 01/02/2023   Obesity affecting pregnancy in first trimester 01/02/2023   Depression affecting pregnancy in second trimester, antepartum 01/02/2023   History of cesarean section 01/02/2023   Encounter for  supervision of high risk pregnancy in first trimester, antepartum 12/05/2022   PAF (paroxysmal atrial fibrillation) (HCC) 01/13/2022   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   Haroldine Laws Certified Nurse Midwife [facility]

## 2023-04-24 NOTE — OB Triage Note (Signed)
 Patient presents to OBS 3 with complaints of contractions every four minutes. The patient states that the contractions started shortly after recent intercourse around 0030 tonight. She rates the contraction pain a 5/10 and states that they are about 4-5 mins apart. Patient denies LOF, no bloody show, no burning when urinating and no vaginal discharge.

## 2023-04-25 LAB — GLUCOSE, CAPILLARY
Glucose-Capillary: 140 mg/dL — ABNORMAL HIGH (ref 70–99)
Glucose-Capillary: 149 mg/dL — ABNORMAL HIGH (ref 70–99)
Glucose-Capillary: 124 mg/dL — ABNORMAL HIGH (ref 70–99)

## 2023-04-25 MED ORDER — CYCLOBENZAPRINE HCL 5 MG PO TABS
10.0000 mg | ORAL_TABLET | Freq: Once | ORAL | Status: AC
  Administered 2023-04-25: 10 mg via ORAL
  Filled 2023-04-25: qty 2

## 2023-04-25 MED ORDER — DOCUSATE SODIUM 100 MG PO CAPS
100.0000 mg | ORAL_CAPSULE | Freq: Every day | ORAL | Status: DC
  Administered 2023-04-25 – 2023-04-26 (×2): 100 mg via ORAL
  Filled 2023-04-25 (×3): qty 1

## 2023-04-25 MED ORDER — NIFEDIPINE 10 MG PO CAPS
10.0000 mg | ORAL_CAPSULE | Freq: Four times a day (QID) | ORAL | Status: DC
  Administered 2023-04-25 – 2023-04-26 (×5): 10 mg via ORAL
  Filled 2023-04-25 (×6): qty 1

## 2023-04-25 NOTE — Progress Notes (Signed)
 ANTEPARTUM PROGRESS NOTE  Tiffany Andrews is a 28 y.o. [phone] at [redacted]w[redacted]d who is admitted for Preterm labor.  Estimated Date of Delivery: 06/24/23  Length of Stay:  1 Days. Admitted 04/24/2023  Subjective: She reports concerns with constant back pain that periodically gets worse. The constant back pain is rated a 4/10 pain and when it gets worse, it feels like a 7/10 pain. She believes it is contractions. She also reports passing a small clot, but it is not present to be visualized. Patient reports good fetal movement.  She reports occasional uterine contractions.  Vitals:  Blood pressure (!) 103/57, pulse 89, temperature 98 F (36.7 C), temperature source Oral, resp. rate 16, height 5\' 8"  (1.727 m), weight 100 kg, last menstrual period 07/06/2022, SpO2 96%. Physical Examination: CONSTITUTIONAL: Well-developed, well-nourished female in no acute distress.  HENT:  Normocephalic, atraumatic, External right and left ear normal. Oropharynx is clear and moist SKIN: Skin is warm and dry. No rash noted. Not diaphoretic. No erythema. No pallor. NEUROLGIC: Alert and oriented to person, place, and time. Normal reflexes, muscle tone coordination. No cranial nerve deficit noted. PSYCHIATRIC: Normal mood and affect. Normal behavior. Normal judgment and thought content. CARDIOVASCULAR: Normal heart rate noted, regular rhythm RESPIRATORY: Effort and breath sounds normal, no problems with respiration noted MUSCULOSKELETAL: Normal range of motion. No edema and no tenderness. 2+ distal pulses. ABDOMEN: Soft, nontender, nondistended, gravid. CERVIX: Dilation: 2.5 Effacement (%): 80 Station: -1 Exam by:: Oxley CNM  Fetal monitoring: FHR: 125 bpm, Variability: moderate, Accelerations: Present, Decelerations: Absent  Uterine activity: rare contractions per hour, occasional uterine irritability  Results for orders placed or performed during the hospital encounter of 04/24/23 (from the past 48 hours)  Urinalysis,  Routine w reflex microscopic -Urine, Clean Catch     Status: Abnormal   Collection Time: 04/24/23  2:20 AM  Result Value Ref Range   Color, Urine YELLOW (A) YELLOW   APPearance HAZY (A) CLEAR   Specific Gravity, Urine 1.020 1.005 - 1.030   pH 6.0 5.0 - 8.0   Glucose, UA NEGATIVE NEGATIVE mg/dL   Hgb urine dipstick NEGATIVE NEGATIVE   Bilirubin Urine NEGATIVE NEGATIVE   Ketones, ur NEGATIVE NEGATIVE mg/dL   Protein, ur NEGATIVE NEGATIVE mg/dL   Nitrite NEGATIVE NEGATIVE   Leukocytes,Ua NEGATIVE NEGATIVE    Comment: Performed at [facility], [address] [place], [place] [address]  Wet prep, genital     Status: Abnormal   Collection Time: 04/24/23  2:20 AM   Specimen: Urine, Clean Catch  Result Value Ref Range   Yeast Wet Prep HPF POC NONE SEEN NONE SEEN   Trich, Wet Prep NONE SEEN NONE SEEN   Clue Cells Wet Prep HPF POC NONE SEEN NONE SEEN   WBC, Wet Prep HPF POC <10 (A) <10   Sperm PRESENT     Comment: Performed at [facility], [address] [place], [place] [address]  Protein / creatinine ratio, urine     Status: None   Collection Time: 04/24/23  4:55 AM  Result Value Ref Range   Creatinine, Urine 30 mg/dL   Total Protein, Urine <6 mg/dL    Comment: NO NORMAL RANGE ESTABLISHED FOR THIS TEST   Protein Creatinine Ratio        0.00 - 0.15 mg/mg[Cre]    Comment: RESULT BELOW REPORTABLE RANGE, UNABLE TO CALCULATE. Performed at [facility], [address], [place], [place] [address]   CBC     Status: Abnormal  Collection Time: 04/24/23  5:35 AM  Result Value Ref Range   WBC 11.8 (H) 4.0 - 10.5 K/uL   RBC 3.61 (L) 3.87 - 5.11 MIL/uL   Hemoglobin 11.4 (L) 12.0 - 15.0 g/dL   HCT 95.6 (L) 21.3 - 08.6 %   MCV 90.3 80.0 - 100.0 fL   MCH 31.6 26.0 - 34.0 pg   MCHC 35.0 30.0 - 36.0 g/dL   RDW 57.8 46.9 - 62.9 %   Platelets 246 150 - 400 K/uL   nRBC 0.0 0.0 - 0.2 %    Comment: Performed at [facility], [address]  [place], [place] [address]  Type and screen     Status: None   Collection Time: 04/24/23  5:35 AM  Result Value Ref Range   ABO/RH(D) A POS    Antibody Screen NEG    Sample Expiration      04/27/2023,2359 Performed at [facility], [address] [place], [place] [address]   Comprehensive metabolic panel     Status: Abnormal   Collection Time: 04/24/23  5:35 AM  Result Value Ref Range   Sodium 137 135 - 145 mmol/L   Potassium 3.1 (L) 3.5 - 5.1 mmol/L   Chloride 108 98 - 111 mmol/L   CO2 24 22 - 32 mmol/L   Glucose, Bld 102 (H) 70 - 99 mg/dL    Comment: Glucose reference range applies only to samples taken after fasting for at least 8 hours.   BUN <5 (L) 6 - 20 mg/dL   Creatinine, Ser 1.02 0.44 - 1.00 mg/dL   Calcium 8.8 (L) 8.9 - 10.3 mg/dL   Total Protein 6.0 (L) 6.5 - 8.1 g/dL   Albumin 2.7 (L) 3.5 - 5.0 g/dL   AST 12 (L) 15 - 41 U/L   ALT 10 0 - 44 U/L   Alkaline Phosphatase 58 38 - 126 U/L   Total Bilirubin 0.5 0.0 - 1.2 mg/dL   GFR, Estimated >72 >53 mL/min    Comment: (NOTE) Calculated using the CKD-EPI Creatinine Equation (2021)    Anion gap 5 5 - 15    Comment: Performed at [facility], [address] [place], [place] [address]  Group B strep by PCR     Status: None   Collection Time: 04/24/23 11:37 AM   Specimen: Vaginal/Rectal; Genital  Result Value Ref Range   Group B strep by PCR PRESUMPTIVE NEGATIVE PRESUMPTIVE NEGATIVE    Comment: (NOTE) Intrapartum testing with Xpert Xpress GBS assay should be used as an adjunct to other methods available and not used to replace antepartum testing (at 35-[redacted] weeks gestation).  A GBS PRESUMPTIVE NEGATIVE should be interpreted as: Patient may or may not be colonized with GBS. A GBS presumptive negative result does not exclude the possibility of GBS colonization. Providers should consider new risk factors, if applicable, and clinical guidance regarding a role for intrapartum prophylaxis.  Recommend culture  for confirmation of GBS colonization is clinically indicated.  Performed at [facility], [address] [place], [place] [address]     No results found.  Current scheduled medications  NIFEdipine  10 mg Oral Q6H   prenatal multivitamin  1 tablet Oral Q1200    I have reviewed the patient's current medications.  ASSESSMENT: Patient Active Problem List   Diagnosis Date Noted   Uterine contractions 04/24/2023   Pelvic pressure in pregnancy 03/14/2023   Gestational diabetes, diet controlled 03/06/2023   Maternal varicella, non-immune 02/11/2023  Rubella non-immune status, antepartum 02/11/2023   Maternal iron deficiency anemia affecting pregnancy in second trimester, antepartum 02/11/2023   Pyelonephritis affecting pregnancy 02/10/2023   UTI (urinary tract infection) 02/10/2023   Hyperemesis gravidarum 01/02/2023   Obesity affecting pregnancy in first trimester 01/02/2023   Depression affecting pregnancy in second trimester, antepartum 01/02/2023   History of cesarean section 01/02/2023   Encounter for supervision of high risk pregnancy in first trimester, antepartum 12/05/2022   PAF (paroxysmal atrial fibrillation) (HCC) 01/13/2022   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 Jean Rosenthal - Continue routine antenatal care. - BMZ complete (2nd dose received 04/25/23 @ 0414) - Magnesium for CP prophylaxis complete (24hrs completed 04/25/23 @ 0433) - Tocolysis transitioned to Procardia 10mg  IR q6hrs - Flexeril 10mg  x1 for muscle pain  - Will recheck cervix if contractions present or bleeding increases or otherwise indicated - Continuous tocometry - NST q shift - BTL consent signed today  - Desires repeat cesarean section for delivery - Requested NICU consult with patient  A1GDM: - blood sugars fasting and  2hrs postprandial - diabetic diet

## 2023-04-25 NOTE — Consult Note (Signed)
 ..Consultation Service: Neonatology   Dr. Jean Rosenthal has asked for consultation on Ms. Towe with MRN# [id number] regarding the care of a premature infant at 31+[redacted] weeks ega. Thank you for inviting [place] to see this patient.    My key findings of this patient's HPI are:  I have reviewed the patient's chart and have met with her. The salient information is as follows. This is a 28 yo G4P1 who presented w/ preterm labor on 04/24/23.    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    Recent MFM u/s (followed due to GDM) on 04/03/23 was reassuring, with normal interval growth, efw at 53%ile, no other concerns.  On admission to L/D on 3/17, NST was normal and reassuring, membranes intact w/ bulging bag, dilated 2.5cm, 80% effacement, cephalic fetus.  She was treated with 24 hr magnesium infusion, terbutaline, IV fluids, and procardia.  Also, course of BMZ initiated.  Rare contractions per hour now, but uterine irritability persists.  Dilation has not progressed, and FHR monitoring remains reassuring. Plan is for continued inpatient monitoring for now, oral procardia, NST q shift, otherwise routine antenatal care.   Of note, patient was referred to [facility] - and perinatal psych at [facility] left  a message for patient to call back [phone], though it does not appear she followed up.    Prenatal labs: reviewed, GBS pending, HIV neg 10/24, HepB s ag neg 11/24, RPR NR 11/24, NIPT reassuring / nl.     Prenatal care:   good Maternal antibiotics: none currently Maternal Steroids: BMZ, second dose 04/25/2023 @ 0414   My  recommendations for this patient and my actions included:   1. In the presence of the Summerlyn Duey and her mother, I spent 30 minutes discussing the possible complications and outcomes of prematurity at this gestational age. I discussed the potential need for resuscitation at birth, mechanical ventilation and surfactant administration for respiratory distress, IV fluids pending establishment of enteral feeds (encouraged breast milk feeding to which she planned to do), antibiotics for possible sepsis, temperature support, and monitoring. I also discussed the potential risk of complications such as intracranial hemorrhage, and chronic lung disease- though fairly low risk at this EGA. I also discussed the potential length of stay in the neonatal intensive care unit for about 4-8 weeks. I discussed this with Oran Rein in detail and they expressed an understanding of the risks and complications of prematurity.   2. I also discussed the expected survival of an infant born at Gestational Age: 82-32 weeks, which is near 95%. We further discussed that few of neonates born at this age have severe neurological complications and that few have school difficulties. She expressed an understanding of this information.   3. I informed her that the NICU team would be present at the delivery. She was counseled on the potential immediate measures which may be required to resuscitate her infant at the delivery. I stressed that we will make every effort to let her see her baby before transfer to NICU, but that, depending on the clinical condition of her child, that may not be possible.  I recommended that a  support person/ parent accompany [place] to the NICU during the transfer, and reviewed that a member of the care team would return to update her as soon as possible after stabilization of her baby.    4. A visit to the NICU by the infant's mother and/or a significant other was encouraged. Visitation policy was discussed.  The  parents are expecting a baby girl.     Final Impression:  28 yo  female with a viable IUP who is has onset of spontaneous PTL, and who now understands the possible complications and prognosis of her infant.   Thank you for this consultation request and please alert the neonatology team if further questions or concerns arise, or the plan for delivery abruptly changes where additional prenatal counseling would benefit this patient.    Face to face consultation time: 30 min Chart Review time: 20 min Total Consultation time: 50 min   Julieta Bellini, MD NICU Attending

## 2023-04-26 ENCOUNTER — Encounter: Payer: Self-pay | Admitting: Obstetrics and Gynecology

## 2023-04-26 LAB — GLUCOSE, CAPILLARY
Glucose-Capillary: 75 mg/dL (ref 70–99)
Glucose-Capillary: 83 mg/dL (ref 70–99)

## 2023-04-26 MED ORDER — NIFEDIPINE 10 MG PO CAPS: 10.0000 mg | ORAL_CAPSULE | Freq: Four times a day (QID) | ORAL | 3 refills | Status: DC | PRN

## 2023-04-26 MED ORDER — FAMOTIDINE 20 MG PO TABS
20.0000 mg | ORAL_TABLET | Freq: Two times a day (BID) | ORAL | Status: DC
  Administered 2023-04-26: 20 mg via ORAL
  Filled 2023-04-26: qty 1

## 2023-04-26 NOTE — Progress Notes (Signed)
 Patient discharged. Discharge instructions given. Patient verbalizes understanding. Transported by axillary.

## 2023-04-26 NOTE — Discharge Instructions (Signed)
 PRETERM LABOR: Includes any of the following symptoms that occur between 20-[redacted] weeks gestation. If these symptoms are not stopped, preterm labor can result in preterm delivery, placing your baby at risk.  Notify your doctor if any of the following occur: 1. Menstrual-like cramps   5. Pelvic pressure  2. Uterine contractions. These may be painless and feel like the uterus is tightening or the baby is "balling up" 6. Increase or change in vaginal discharge  3. Low, dull backache, unrelieved by heat or Tylenol  7. Vaginal bleeding  4. Intestinal cramps, with our without diarrhea, sometimes 8. A general feeling that "something is not right"   9. Leaking of fluid described as "gas pain"

## 2023-04-26 NOTE — Progress Notes (Signed)
   04/26/23 1430  Fetal Heart Rate A  Mode External  Baseline Rate (A) 140 bpm  Variability 6-25 BPM  Accelerations 15 x 15  Decelerations None  Uterine Activity  Mode Toco  Contraction Frequency (min) none   Reactive NST. CNM reviewed

## 2023-04-26 NOTE — Discharge Summary (Signed)
 Patient ID: Tiffany Andrews MRN: [id number] DOB/AGE: [birth year] 27 y.o.  Admit date: 04/24/2023 Discharge date: 04/26/2023  Admission Diagnoses: Uterine contractions [O47.9]   Discharge Diagnoses: Threatened preterm labor   Prenatal Care Site: [facility]  Prenatal Procedures: NST and ultrasound  Consults: Neonatology, Maternal Fetal Medicine  Significant Diagnostic Studies:  Results for orders placed or performed during the hospital encounter of 04/24/23 (from the past week)  Wet prep, genital   Collection Time: 04/24/23  2:20 AM   Specimen: Urine, Clean Catch  Result Value Ref Range   Yeast Wet Prep HPF POC NONE SEEN NONE SEEN   Trich, Wet Prep NONE SEEN NONE SEEN   Clue Cells Wet Prep HPF POC NONE SEEN NONE SEEN   WBC, Wet Prep HPF POC <10 (A) <10   Sperm PRESENT   Urinalysis, Routine w reflex microscopic -Urine, Clean Catch   Collection Time: 04/24/23  2:20 AM  Result Value Ref Range   Color, Urine YELLOW (A) YELLOW   APPearance HAZY (A) CLEAR   Specific Gravity, Urine 1.020 1.005 - 1.030   pH 6.0 5.0 - 8.0   Glucose, UA NEGATIVE NEGATIVE mg/dL   Hgb urine dipstick NEGATIVE NEGATIVE   Bilirubin Urine NEGATIVE NEGATIVE   Ketones, ur NEGATIVE NEGATIVE mg/dL   Protein, ur NEGATIVE NEGATIVE mg/dL   Nitrite NEGATIVE NEGATIVE   Leukocytes,Ua NEGATIVE NEGATIVE  Protein / creatinine ratio, urine   Collection Time: 04/24/23  4:55 AM  Result Value Ref Range   Creatinine, Urine 30 mg/dL   Total Protein, Urine <6 mg/dL   Protein Creatinine Ratio        0.00 - 0.15 mg/mg[Cre]  CBC   Collection Time: 04/24/23  5:35 AM  Result Value Ref Range   WBC 11.8 (H) 4.0 - 10.5 K/uL   RBC 3.61 (L) 3.87 - 5.11 MIL/uL   Hemoglobin 11.4 (L) 12.0 - 15.0 g/dL   HCT 40.9 (L) 81.1 - 91.4 %   MCV 90.3 80.0 - 100.0 fL   MCH 31.6 26.0 - 34.0 pg   MCHC 35.0 30.0 - 36.0 g/dL   RDW 78.2 95.6 - 21.3 %   Platelets 246 150 - 400 K/uL   nRBC 0.0 0.0 - 0.2 %  Comprehensive metabolic panel    Collection Time: 04/24/23  5:35 AM  Result Value Ref Range   Sodium 137 135 - 145 mmol/L   Potassium 3.1 (L) 3.5 - 5.1 mmol/L   Chloride 108 98 - 111 mmol/L   CO2 24 22 - 32 mmol/L   Glucose, Bld 102 (H) 70 - 99 mg/dL   BUN <5 (L) 6 - 20 mg/dL   Creatinine, Ser 0.86 0.44 - 1.00 mg/dL   Calcium 8.8 (L) 8.9 - 10.3 mg/dL   Total Protein 6.0 (L) 6.5 - 8.1 g/dL   Albumin 2.7 (L) 3.5 - 5.0 g/dL   AST 12 (L) 15 - 41 U/L   ALT 10 0 - 44 U/L   Alkaline Phosphatase 58 38 - 126 U/L   Total Bilirubin 0.5 0.0 - 1.2 mg/dL   GFR, Estimated >57 >84 mL/min   Anion gap 5 5 - 15  Type and screen   Collection Time: 04/24/23  5:35 AM  Result Value Ref Range   ABO/RH(D) A POS    Antibody Screen NEG    Sample Expiration      04/27/2023,2359 Performed at [facility], [address] [place], [place] [address]   Group B strep by PCR  Collection Time: 04/24/23 11:37 AM   Specimen: Vaginal/Rectal; Genital  Result Value Ref Range   Group B strep by PCR PRESUMPTIVE NEGATIVE PRESUMPTIVE NEGATIVE  Glucose, capillary   Collection Time: 04/25/23  1:05 PM  Result Value Ref Range   Glucose-Capillary 140 (H) 70 - 99 mg/dL  Glucose, capillary   Collection Time: 04/25/23  6:06 PM  Result Value Ref Range   Glucose-Capillary 149 (H) 70 - 99 mg/dL  Glucose, capillary   Collection Time: 04/25/23 10:01 PM  Result Value Ref Range   Glucose-Capillary 124 (H) 70 - 99 mg/dL  Glucose, capillary   Collection Time: 04/26/23  8:13 AM  Result Value Ref Range   Glucose-Capillary 75 70 - 99 mg/dL  Glucose, capillary   Collection Time: 04/26/23 12:15 PM  Result Value Ref Range   Glucose-Capillary 83 70 - 99 mg/dL    Treatments: IV hydration, magnesium sulfate for neuroprotection, betamethasone for fetal lung maturity, nifedipine for preterm contractions   Hospital Course:  This is a 28 y.o. [id number] with IUP at [redacted]w[redacted]d admitted for threatened preterm labor, noted to have a cervical exam of 2-3/80/-1  with bulging membranes.  No leaking of fluid and no bleeding.  She was initially started on magnesium sulfate for tocolysis and neuroprotection and also received betamethasone x 2 doses.  Her tocolysis was transitioned to Nifedipine 10 mg every 6 hours as needed for contractions. She was seen by Neonatology during her stay.  She was observed, fetal heart rate monitoring remained reassuring, and she had no signs/symptoms of progressing preterm labor or other maternal-fetal concerns.  Her cervical exam was unchanged from admission after > 48 hours.  She was deemed stable for discharge to home with outpatient follow up.  Discharge Physical Exam:  BP (!) 99/59 (BP Location: Left Arm) Comment: nurse Monik Lins notified  Pulse 80   Temp 98.5 F (36.9 C) (Oral)   Resp 18   Ht 5\' 8"  (1.727 m)   Wt 100 kg   LMP 07/06/2022 (Approximate)   SpO2 98%   BMI 33.51 kg/m   General: NAD CV: RRR Pulm: CTABL, nl effort ABD: s/nd/nt, gravid DVT Evaluation: LE non-ttp, no evidence of DVT on exam.  SVE: Dilation: 2.5 Effacement (%): 70 Cervical Position: Posterior Station: -2 Presentation: Vertex Exam by:: Donato Schultz, CNM  NST: Baseline FHR: 130 beats/min Variability: moderate Accelerations: present Decelerations: absent Tocometry: Occasional   Interpretation:  INDICATIONS: threatened preterm labor  RESULTS:  A NST procedure was performed with FHR monitoring and a normal baseline established, appropriate time of 20-40 minutes of evaluation, and accels >2 seen w 15x15 characteristics.  Results show a REACTIVE NST.     Discharge Condition: Stable  Disposition: Discharge disposition: 01-Home or Self Care        Allergies as of 04/26/2023       Reactions   Bee Venom Hives, Itching   Iodinated Contrast Media Shortness Of Breath, Nausea Only   Pt experienced nausea, severe facial tingling/swelling, feeling as though throat was tight and closing up, difficulty breathing, chest pressure and  tightness    Other Anaphylaxis, Swelling, Other (See Comments)   Stinging insects   Shellfish Allergy Anaphylaxis, Swelling   Latex    Other reaction(s): Unknown   Hydromorphone Anxiety, Itching        Medication List     TAKE these medications    acetaminophen 500 MG tablet Commonly known as: TYLENOL Take 2 tablets (1,000 mg total) by mouth every 6 (six) hours as  needed for mild pain (pain score 1-3), moderate pain (pain score 4-6) or fever.   EPINEPHrine 0.3 mg/0.3 mL Soaj injection Commonly known as: EPI-PEN Inject 0.3 mg into the muscle as needed for anaphylaxis.   famotidine 20 MG tablet Commonly known as: PEPCID Take 1 tablet (20 mg total) by mouth 2 (two) times daily.   Maalox Max 400-400-40 MG/5ML suspension Generic drug: alum & mag hydroxide-simeth Take 10 mLs by mouth every 6 (six) hours as needed for indigestion.   multivitamin-prenatal 27-0.8 MG Tabs tablet Take 1 tablet by mouth daily at 12 noon.   NIFEdipine 10 MG capsule Commonly known as: PROCARDIA Take 1 capsule (10 mg total) by mouth every 6 (six) hours as needed (for 4-6 or more contractions in 1 hour).   ondansetron 4 MG disintegrating tablet Commonly known as: ZOFRAN-ODT Take 1 tablet (4 mg total) by mouth every 8 (eight) hours as needed for nausea or vomiting.   sucralfate 1 GM/10ML suspension Commonly known as: Carafate Take 10 mLs (1 g total) by mouth 4 (four) times daily.        Follow-up Information     [facility]. Schedule an appointment as soon as possible for a visit.   Why: routine prenatal appointment and non-stress test Contact information: [address] [place] [address] [phone]                Signed:  Gustavo Lah 04/26/2023 2:45 PM ----- Margaretmary Eddy, CNM Certified Nurse Midwife [facility]

## 2023-04-26 NOTE — Progress Notes (Addendum)
 S: Notified by RN that patient reported her contractions returned and were rated 3/10 pain and were coming every 3-4 minutes, accompanied with more loss of her mucus plug.  O: cervix unchanged, 2.5cm/70%/-2, soft, ballottable, BOW intact but no longer bulging.  FHR: 140bpm, moderate variability, accelerations present, no decelerations, Category I. Contractions: q2-59min initially then irregular and spaced out and rare.  A: 28 year old [id number] at [redacted]w[redacted]d admitted with preterm labor symptoms  P:  Administer Procardia 10mg  IR q6hrs for contractions Continuous monitoring x 2hrs, contractions have stopped Reactive NST If contractions continue, administer terbutaline Cervix unchanged, BOW is no longer bulging Contractions are spacing out and becoming less intense  Janyce Llanos, CNM 04/26/2023 1:26 AM

## 2023-04-26 NOTE — Progress Notes (Signed)
 ANTEPARTUM PROGRESS NOTE  Tiffany Andrews is a 28 y.o. [phone] at [redacted]w[redacted]d who is admitted for threatened preterm labor.   Estimated Date of Delivery: 06/24/23  Length of Stay:  2 Days. Admitted 04/24/2023  Subjective: Feeling better this morning. States contractions have not returned since nifedipine dose last night. Back pain has improved. Requesting to discharge home.   She reports:  -active fetal movement -no leakage of fluid -no contractions -Occasional spotting when she wipes  Vitals:  BP (!) 99/59 (BP Location: Left Arm) Comment: nurse Cove Haydon notified  Pulse 80   Temp 98.5 F (36.9 C) (Oral)   Resp 18   Ht 5\' 8"  (1.727 m)   Wt 100 kg   LMP 07/06/2022 (Approximate)   SpO2 98%   BMI 33.51 kg/m  Physical Examination: General:   alert, cooperative, and no distress  Skin:  normal  Neurologic:    Alert & oriented x 3  Lungs:   Normal respiratory effort   Abdomen:   Gravid, soft, non-tender   Pelvis:  Exam deferred. Recent pelvic exam done.   Extremities: : non-tender, symmetric, no edema bilaterally.  DTRs: 2+/2+     Results for orders placed or performed during the hospital encounter of 04/24/23 (from the past 48 hours)  Glucose, capillary     Status: Abnormal   Collection Time: 04/25/23  1:05 PM  Result Value Ref Range   Glucose-Capillary 140 (H) 70 - 99 mg/dL    Comment: Glucose reference range applies only to samples taken after fasting for at least 8 hours.  Glucose, capillary     Status: Abnormal   Collection Time: 04/25/23  6:06 PM  Result Value Ref Range   Glucose-Capillary 149 (H) 70 - 99 mg/dL    Comment: Glucose reference range applies only to samples taken after fasting for at least 8 hours.  Glucose, capillary     Status: Abnormal   Collection Time: 04/25/23 10:01 PM  Result Value Ref Range   Glucose-Capillary 124 (H) 70 - 99 mg/dL    Comment: Glucose reference range applies only to samples taken after fasting for at least 8 hours.  Glucose, capillary      Status: None   Collection Time: 04/26/23  8:13 AM  Result Value Ref Range   Glucose-Capillary 75 70 - 99 mg/dL    Comment: Glucose reference range applies only to samples taken after fasting for at least 8 hours.  Glucose, capillary     Status: None   Collection Time: 04/26/23 12:15 PM  Result Value Ref Range   Glucose-Capillary 83 70 - 99 mg/dL    Comment: Glucose reference range applies only to samples taken after fasting for at least 8 hours.    No results found.  Current scheduled medications  docusate sodium  100 mg Oral Daily   famotidine  20 mg Oral BID   NIFEdipine  10 mg Oral Q6H   prenatal multivitamin  1 tablet Oral Q1200    I have reviewed the patient's current medications.  ASSESSMENT: Principal Problem:   Threatened preterm labor, antepartum Active Problems:   Anxiety   Obesity affecting pregnancy in first trimester   History of cesarean section    PLAN: High risk antepartum:  -Inpatient status for threatened preterm labor:  -Continue routine antepartum care  -NST Reactive  -Activity: No restrictions  Threatened preterm labor  -Betamethasone given x 2 doses. Last dose 04/25/2023 at 0400 -Mag sulfate infusion completed for neuroprotection  -GBS negative  -NICU consult  completed  -Improvement in symptoms with nifedipine. Reports resolution of contractions. Cervical exam unchanged since admission  -discussed possible discharge later this afternoon as long as symptoms do no return   A1GDM: -Continue Gestational carb modified diet  -Fasting and 2 hour PP CBG  -Insulin not indicated at this time. Mild elevation in CBG d/t betamethasone administration. Will consider insulin to treat blood sugars > 150.   Previous c/section  -desires repeat c/section when indicated     Margaretmary Eddy, CNM Certified Nurse Midwife [facility]

## 2023-04-26 NOTE — Progress Notes (Signed)
 Patient back to her room on MB after being monitored in birthplace.

## 2023-05-01 ENCOUNTER — Observation Stay: Admitting: Maternal & Fetal Medicine

## 2023-05-01 ENCOUNTER — Ambulatory Visit (HOSPITAL_BASED_OUTPATIENT_CLINIC_OR_DEPARTMENT_OTHER): Payer: Medicaid Other

## 2023-05-01 ENCOUNTER — Observation Stay
Admission: RE | Admit: 2023-05-01 | Discharge: 2023-05-01 | Disposition: A | Discharge status: Home / Self Care | Source: Ambulatory Visit | Attending: Certified Nurse Midwife | Admitting: Certified Nurse Midwife

## 2023-05-01 ENCOUNTER — Other Ambulatory Visit: Payer: Self-pay

## 2023-05-01 ENCOUNTER — Encounter: Payer: Self-pay | Admitting: Obstetrics and Gynecology

## 2023-05-01 VITALS — BP 126/81 | HR 110 | Wt 222.2 lb

## 2023-05-01 DIAGNOSIS — O99413 Diseases of the circulatory system complicating pregnancy, third trimester: Secondary | ICD-10-CM | POA: Insufficient documentation

## 2023-05-01 DIAGNOSIS — O09899 Supervision of other high risk pregnancies, unspecified trimester: Principal | ICD-10-CM

## 2023-05-01 DIAGNOSIS — F17210 Nicotine dependence, cigarettes, uncomplicated: Secondary | ICD-10-CM | POA: Diagnosis not present

## 2023-05-01 DIAGNOSIS — D509 Iron deficiency anemia, unspecified: Secondary | ICD-10-CM

## 2023-05-01 DIAGNOSIS — O24410 Gestational diabetes mellitus in pregnancy, diet controlled: Secondary | ICD-10-CM | POA: Insufficient documentation

## 2023-05-01 DIAGNOSIS — O99333 Smoking (tobacco) complicating pregnancy, third trimester: Secondary | ICD-10-CM | POA: Insufficient documentation

## 2023-05-01 DIAGNOSIS — O26893 Other specified pregnancy related conditions, third trimester: Secondary | ICD-10-CM | POA: Diagnosis not present

## 2023-05-01 DIAGNOSIS — Z3A32 32 weeks gestation of pregnancy: Secondary | ICD-10-CM

## 2023-05-01 DIAGNOSIS — O99213 Obesity complicating pregnancy, third trimester: Secondary | ICD-10-CM | POA: Insufficient documentation

## 2023-05-01 DIAGNOSIS — O4703 False labor before 37 completed weeks of gestation, third trimester: Principal | ICD-10-CM | POA: Insufficient documentation

## 2023-05-01 DIAGNOSIS — E669 Obesity, unspecified: Secondary | ICD-10-CM

## 2023-05-01 DIAGNOSIS — O471 False labor at or after 37 completed weeks of gestation: Principal | ICD-10-CM | POA: Insufficient documentation

## 2023-05-01 DIAGNOSIS — O4700 False labor before 37 completed weeks of gestation, unspecified trimester: Secondary | ICD-10-CM | POA: Diagnosis present

## 2023-05-01 DIAGNOSIS — O99513 Diseases of the respiratory system complicating pregnancy, third trimester: Secondary | ICD-10-CM | POA: Diagnosis not present

## 2023-05-01 DIAGNOSIS — Z2839 Other underimmunization status: Secondary | ICD-10-CM

## 2023-05-01 DIAGNOSIS — J45909 Unspecified asthma, uncomplicated: Secondary | ICD-10-CM | POA: Diagnosis not present

## 2023-05-01 DIAGNOSIS — Z79899 Other long term (current) drug therapy: Secondary | ICD-10-CM | POA: Insufficient documentation

## 2023-05-01 DIAGNOSIS — I499 Cardiac arrhythmia, unspecified: Secondary | ICD-10-CM

## 2023-05-01 DIAGNOSIS — I480 Paroxysmal atrial fibrillation: Secondary | ICD-10-CM | POA: Insufficient documentation

## 2023-05-01 DIAGNOSIS — Z91040 Latex allergy status: Secondary | ICD-10-CM | POA: Insufficient documentation

## 2023-05-01 DIAGNOSIS — R1084 Generalized abdominal pain: Secondary | ICD-10-CM | POA: Diagnosis not present

## 2023-05-01 DIAGNOSIS — Z362 Encounter for other antenatal screening follow-up: Secondary | ICD-10-CM | POA: Insufficient documentation

## 2023-05-01 DIAGNOSIS — O99211 Obesity complicating pregnancy, first trimester: Secondary | ICD-10-CM

## 2023-05-01 LAB — URINALYSIS, ROUTINE W REFLEX MICROSCOPIC
Bacteria, UA: NONE SEEN
Bilirubin Urine: NEGATIVE
Glucose, UA: NEGATIVE mg/dL
Ketones, ur: NEGATIVE mg/dL
Nitrite: NEGATIVE
Protein, ur: NEGATIVE mg/dL
RBC / HPF: >50 RBC/hpf (ref 0–5)
Specific Gravity, Urine: 1.021 (ref 1.005–1.030)
pH: 6 (ref 5.0–8.0)

## 2023-05-01 LAB — FETAL FIBRONECTIN: Fetal Fibronectin: POSITIVE — AB

## 2023-05-01 MED ORDER — DOCUSATE SODIUM 100 MG PO CAPS: 100.0000 mg | ORAL_CAPSULE | Freq: Every day | ORAL | Status: DC

## 2023-05-01 MED ORDER — ACETAMINOPHEN 325 MG PO TABS: 650.0000 mg | ORAL_TABLET | ORAL | Status: DC | PRN

## 2023-05-01 MED ORDER — CALCIUM CARBONATE ANTACID 500 MG PO CHEW
2.0000 | CHEWABLE_TABLET | ORAL | Status: DC | PRN
Start: 2023-05-01 — End: 2023-05-01

## 2023-05-01 MED ORDER — TERBUTALINE SULFATE 1 MG/ML IJ SOLN
0.2500 mg | INTRAMUSCULAR | Status: AC
Start: 2023-05-01 — End: 2023-05-01
  Administered 2023-05-01: 0.25 mg via SUBCUTANEOUS
  Filled 2023-05-01: qty 1
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
## 2023-05-01 NOTE — Progress Notes (Signed)
 Patient information  Patient Name: Tiffany Andrews  Patient MRN:   [id number]  Referring practice: [facility] Referring Provider: [facility]  MFM CONSULT  Tiffany Andrews is a 28 y.o. [phone] at [redacted]w[redacted]d here for ultrasound and consultation. Patient Active Problem List   Diagnosis Date Noted   Preterm uterine contractions 05/01/2023   Threatened preterm labor, antepartum 04/24/2023   Pelvic pressure in pregnancy 03/14/2023   Gestational diabetes, diet controlled 03/06/2023   Maternal varicella, non-immune 02/11/2023   Rubella non-immune status, antepartum 02/11/2023   Maternal iron deficiency anemia affecting pregnancy in second trimester, antepartum 02/11/2023   UTI (urinary tract infection) 02/10/2023   Hyperemesis gravidarum 01/02/2023   Obesity affecting pregnancy in first trimester 01/02/2023   Depression affecting pregnancy in second trimester, antepartum 01/02/2023   History of cesarean section 01/02/2023   Supervision of high risk pregnancy in third trimester 12/05/2022   PAF (paroxysmal atrial fibrillation) (HCC) 01/13/2022   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   Vasovagal syncope 07/07/2021   Anxiety 04/01/2021   SVT (supraventricular tachycardia) (HCC) 04/01/2021    Tiffany Andrews has a pregnancy with the complications mentioned in the problem list. During today's visit we focused on the following concerns:   RE preterm contractions: The patient reports that she seen in the hospital recently for contractions.  She was given a tocolytic as well as betamethasone.  She reports that she is very uncomfortable today still experiencing painful contractions.  I encouraged her to go back to labor and delivery to be monitored and assessed for possible admission.  Sonographic findings Single intrauterine pregnancy. Fetal cardiac activity: Observed. Presentation: Cephalic. Interval fetal anatomy appears normal. Fetal biometry shows the estimated fetal weight at the 65  percentile. Amniotic fluid: Within normal limits.  MVP: 6.79 cm. Placenta: Anterior. BPP: 8/8.   There are limitations of prenatal ultrasound such as the inability to detect certain abnormalities due to poor visualization. Various factors such as fetal position, gestational age and maternal body habitus may increase the difficulty in visualizing the fetal anatomy.    Recommendations -Sent to L&D to be assessed for possible preterm labor  -If discharged, continue weekly antenatal testing -Continue routine prenatal care with referring OB provider  Review of Systems: A review of systems was performed and was negative except per HPI   Vitals and Physical Exam    05/01/2023   10:41 AM 05/01/2023   10:39 AM 05/01/2023    9:05 AM  Vitals with BMI  Height  5\' 8"    Weight  222 lbs 8 oz 222 lbs 3 oz  BMI  33.84   Systolic 119  126  Diastolic 72  81  Pulse 89  110    Sitting comfortably on the sonogram table Nonlabored breathing Normal rate and rhythm Abdomen is nontender  Past pregnancies OB History  Gravida Para Term Preterm AB Living  4 1 1  2 1   SAB IAB Ectopic Multiple Live Births  2   0 1    # Outcome Date GA Lbr Len/2nd Weight Sex Type Anes PTL Lv  4 Current           3 SAB 01/18/22 [redacted]w[redacted]d         2 SAB 04/2021 [redacted]w[redacted]d         1 Term 09/27/17 [redacted]w[redacted]d  3900 g M CS-LTranv Spinal  LIV     I spent 10 minutes reviewing the patients chart, including labs and images as well as counseling the patient  about her medical conditions. Greater than 50% of the time was spent in direct face-to-face patient counseling.  [name], DO Maternal fetal medicine, [place]   05/01/2023  1:14 PM

## 2023-05-01 NOTE — Progress Notes (Signed)
 Pt discharged home per order. Pt reports relief from contraction pain . SVE remains unchanged,    Pt stable and ambulatory and an After Visit Summary was printed and given to the patient. Discharge education completed with patient including follow up instructions, appointments, and medication list. Pt received labor and bleeding precautions. Patient able to verbalize understanding, all questions fully answered upon discharge. Patient instructed to return to ED, call 911, or call MD for any changes in condition.   Pt discharged home via personal vehicle with support person.

## 2023-05-01 NOTE — Progress Notes (Signed)
 Pt presents to L/D triage with reported increase in pelvic pressure and cramping this morning. Pt rates pain 5/10, and says that it occasionally goes down her right leg and is worse with ambulation. Pt took AM procardia and Tylenol. No reported vaginal bleeding or LOF. She reports positive fetal movement. Monitors applied and assessing. Initial FHT 125. VSS.  [place] and FFN collected and CNM notified of patient's arrival.

## 2023-05-08 IMAGING — CR DG FOOT COMPLETE 3+V*L*
3 series · 3 of 3 positions shown · non-contrast
Comparison: None.

CLINICAL DATA: Left foot pain for 2 weeks, no known injury

EXAM:
LEFT FOOT - COMPLETE 3+ VIEW

[foot ap]
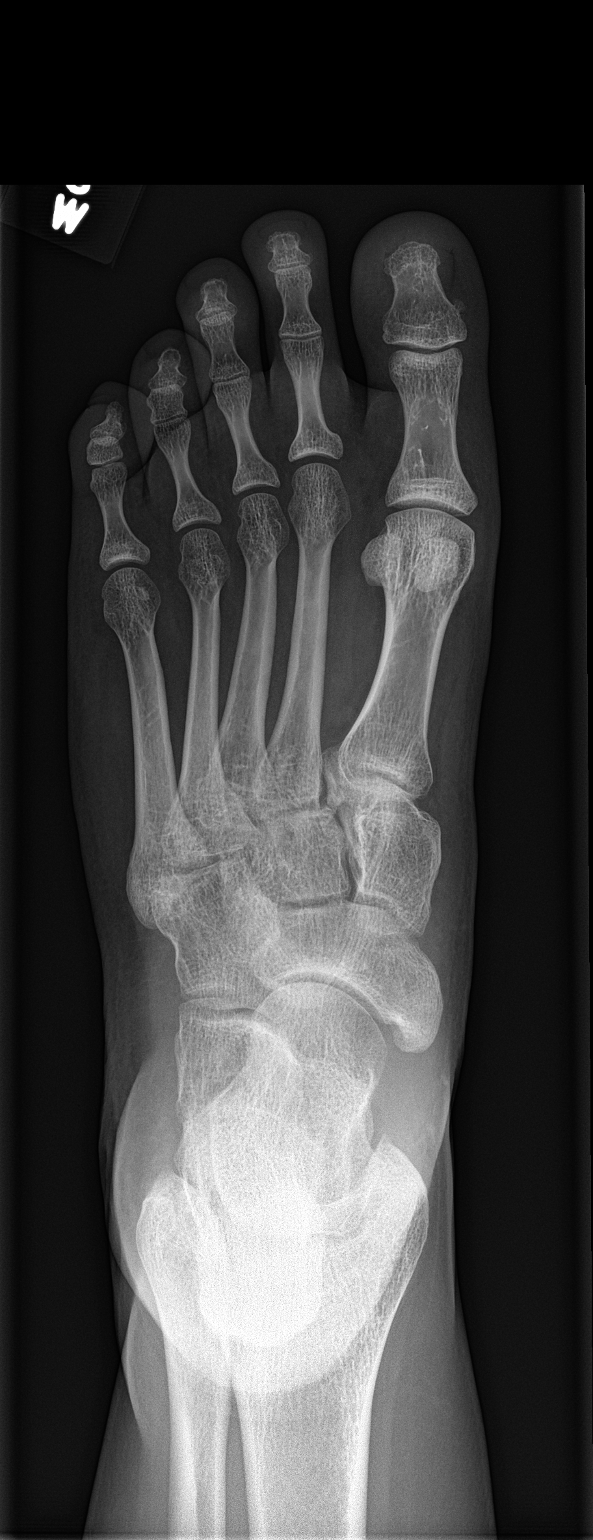

[foot obl]
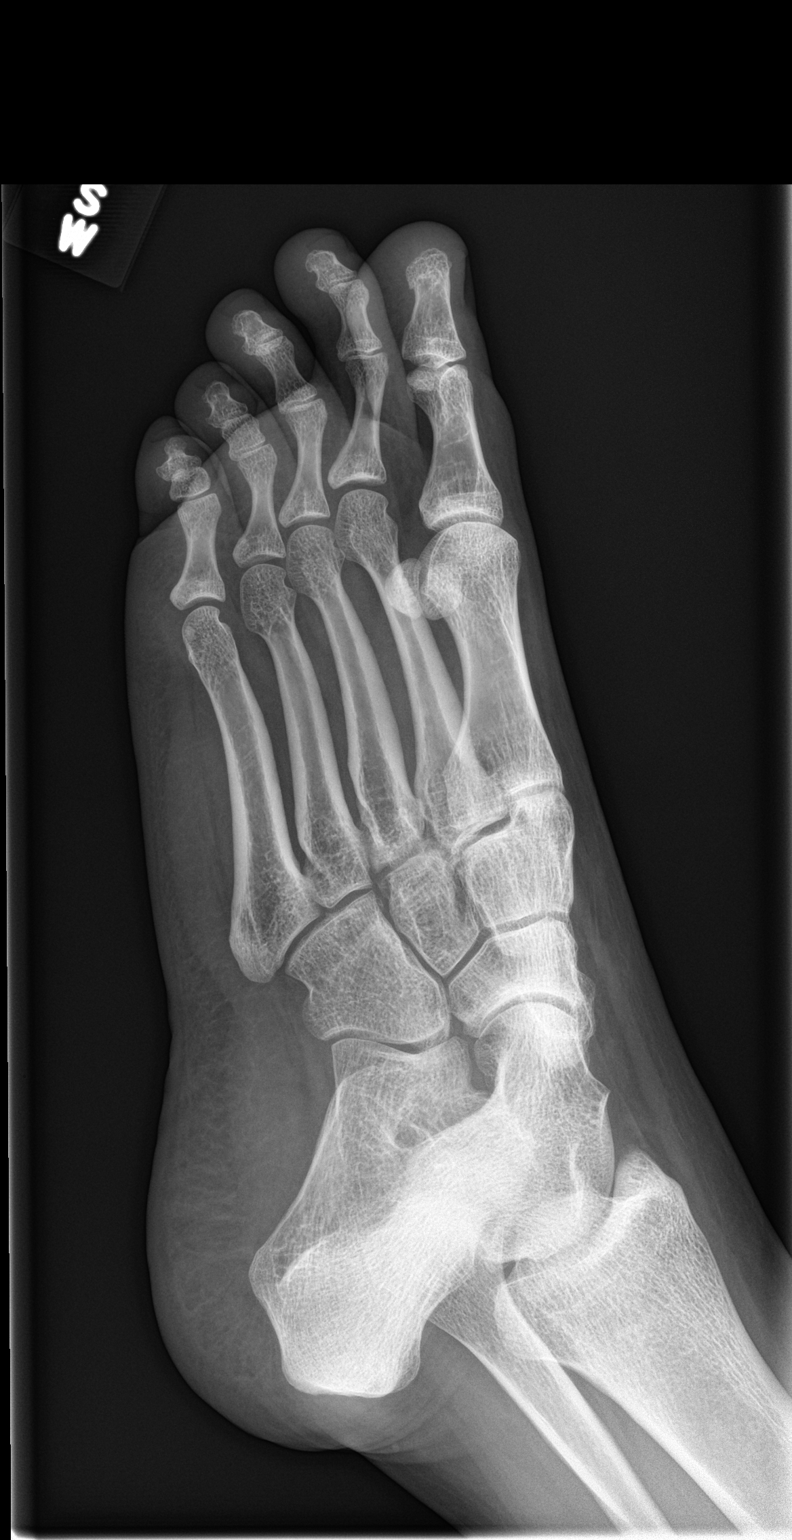

[foot lat]
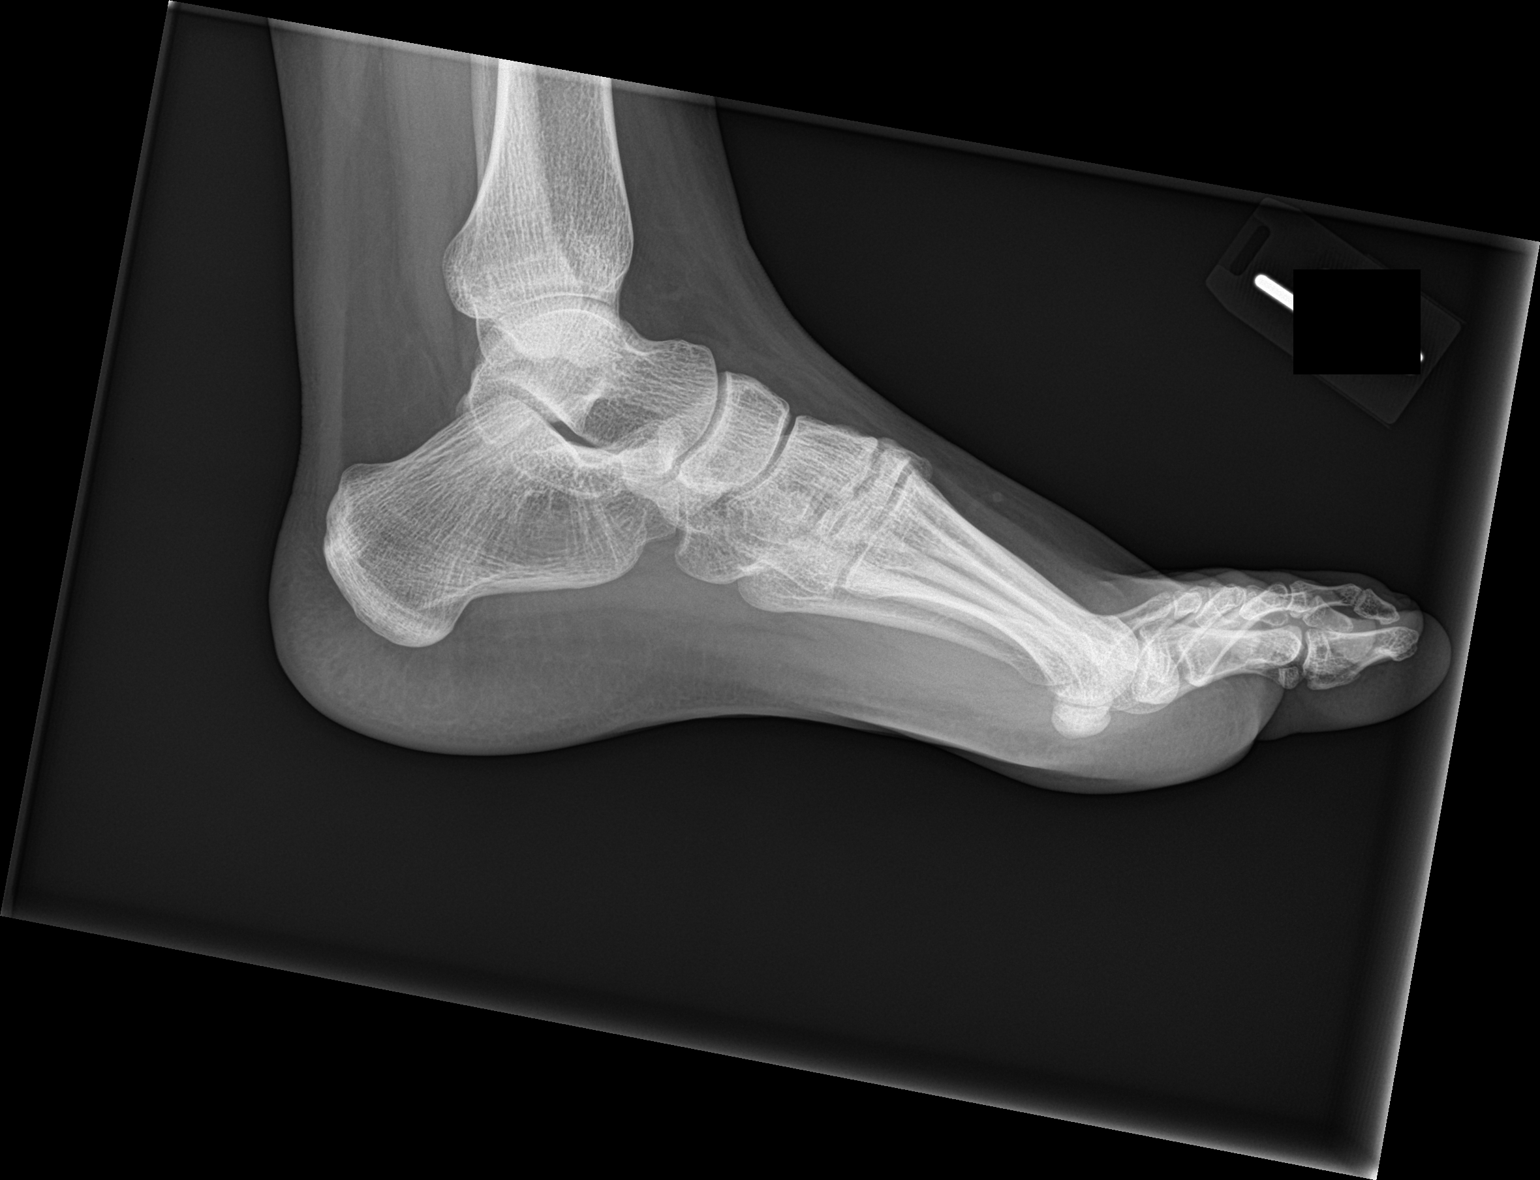

[3 of 3 positions shown; findings below may reference images not displayed]

FINDINGS: There is no evidence of fracture or dislocation. There is no
evidence of arthropathy or other focal bone abnormality. Soft
tissues are unremarkable.
IMPRESSION: No fracture or dislocation of the left foot. Joint spaces are well
preserved.

## 2023-05-15 ENCOUNTER — Observation Stay: Admitting: Anesthesiology

## 2023-05-15 ENCOUNTER — Encounter
Admission: EM | Disposition: A | Discharge status: Home / Self Care | Payer: Self-pay | Source: Home / Self Care | Attending: Obstetrics and Gynecology

## 2023-05-15 ENCOUNTER — Encounter: Payer: Self-pay | Admitting: Obstetrics and Gynecology

## 2023-05-15 ENCOUNTER — Other Ambulatory Visit: Payer: Self-pay

## 2023-05-15 ENCOUNTER — Inpatient Hospital Stay
Admission: EM | Admit: 2023-05-15 | Discharge: 2023-05-18 | DRG: 784 | Disposition: A | Discharge status: Home / Self Care | Attending: Obstetrics and Gynecology | Admitting: Obstetrics and Gynecology

## 2023-05-15 DIAGNOSIS — O9081 Anemia of the puerperium: Secondary | ICD-10-CM | POA: Diagnosis not present

## 2023-05-15 DIAGNOSIS — D509 Iron deficiency anemia, unspecified: Secondary | ICD-10-CM | POA: Diagnosis present

## 2023-05-15 DIAGNOSIS — O24420 Gestational diabetes mellitus in childbirth, diet controlled: Secondary | ICD-10-CM | POA: Diagnosis present

## 2023-05-15 DIAGNOSIS — R001 Bradycardia, unspecified: Secondary | ICD-10-CM | POA: Diagnosis present

## 2023-05-15 DIAGNOSIS — K219 Gastro-esophageal reflux disease without esophagitis: Secondary | ICD-10-CM | POA: Diagnosis present

## 2023-05-15 DIAGNOSIS — Z23 Encounter for immunization: Secondary | ICD-10-CM

## 2023-05-15 DIAGNOSIS — Z91013 Allergy to seafood: Secondary | ICD-10-CM

## 2023-05-15 DIAGNOSIS — D62 Acute posthemorrhagic anemia: Secondary | ICD-10-CM | POA: Diagnosis not present

## 2023-05-15 DIAGNOSIS — O9962 Diseases of the digestive system complicating childbirth: Secondary | ICD-10-CM | POA: Diagnosis present

## 2023-05-15 DIAGNOSIS — O34211 Maternal care for low transverse scar from previous cesarean delivery: Secondary | ICD-10-CM | POA: Diagnosis present

## 2023-05-15 DIAGNOSIS — O479 False labor, unspecified: Secondary | ICD-10-CM | POA: Diagnosis present

## 2023-05-15 DIAGNOSIS — Z302 Encounter for sterilization: Secondary | ICD-10-CM | POA: Diagnosis not present

## 2023-05-15 DIAGNOSIS — O4700 False labor before 37 completed weeks of gestation, unspecified trimester: Secondary | ICD-10-CM | POA: Diagnosis present

## 2023-05-15 DIAGNOSIS — F17290 Nicotine dependence, other tobacco product, uncomplicated: Secondary | ICD-10-CM | POA: Diagnosis present

## 2023-05-15 DIAGNOSIS — O99334 Smoking (tobacco) complicating childbirth: Secondary | ICD-10-CM | POA: Diagnosis present

## 2023-05-15 DIAGNOSIS — O99211 Obesity complicating pregnancy, first trimester: Secondary | ICD-10-CM | POA: Diagnosis present

## 2023-05-15 DIAGNOSIS — Z3490 Encounter for supervision of normal pregnancy, unspecified, unspecified trimester: Secondary | ICD-10-CM

## 2023-05-15 DIAGNOSIS — Z8249 Family history of ischemic heart disease and other diseases of the circulatory system: Secondary | ICD-10-CM | POA: Diagnosis not present

## 2023-05-15 DIAGNOSIS — Z3A34 34 weeks gestation of pregnancy: Secondary | ICD-10-CM | POA: Diagnosis not present

## 2023-05-15 DIAGNOSIS — Z2839 Supervision of other high risk pregnancies, unspecified trimester: Principal | ICD-10-CM

## 2023-05-15 DIAGNOSIS — Z91040 Latex allergy status: Secondary | ICD-10-CM

## 2023-05-15 DIAGNOSIS — Z98891 History of uterine scar from previous surgery: Secondary | ICD-10-CM

## 2023-05-15 DIAGNOSIS — O09899 Supervision of other high risk pregnancies, unspecified trimester: Principal | ICD-10-CM

## 2023-05-15 DIAGNOSIS — O24410 Gestational diabetes mellitus in pregnancy, diet controlled: Secondary | ICD-10-CM | POA: Diagnosis present

## 2023-05-15 DIAGNOSIS — O99214 Obesity complicating childbirth: Secondary | ICD-10-CM | POA: Diagnosis present

## 2023-05-15 LAB — URINALYSIS, COMPLETE (UACMP) WITH MICROSCOPIC
Bilirubin Urine: NEGATIVE
Glucose, UA: NEGATIVE mg/dL
Hgb urine dipstick: NEGATIVE
Ketones, ur: 20 mg/dL — AB
Nitrite: NEGATIVE
Protein, ur: NEGATIVE mg/dL
Specific Gravity, Urine: 1.024 (ref 1.005–1.030)
pH: 5 (ref 5.0–8.0)

## 2023-05-15 LAB — GROUP B STREP BY PCR: Group B strep by PCR: NEGATIVE

## 2023-05-15 LAB — GLUCOSE, CAPILLARY: Glucose-Capillary: 84 mg/dL (ref 70–99)

## 2023-05-15 LAB — CBC
HCT: 33.4 % — ABNORMAL LOW (ref 36.0–46.0)
Hemoglobin: 11.7 g/dL — ABNORMAL LOW (ref 12.0–15.0)
MCH: 30.2 pg (ref 26.0–34.0)
MCHC: 35 g/dL (ref 30.0–36.0)
MCV: 86.3 fL (ref 80.0–100.0)
Platelets: 224 10*3/uL (ref 150–400)
RBC: 3.87 MIL/uL (ref 3.87–5.11)
RDW: 13 % (ref 11.5–15.5)
WBC: 11.7 10*3/uL — ABNORMAL HIGH (ref 4.0–10.5)
nRBC: 0 % (ref 0.0–0.2)

## 2023-05-15 LAB — TYPE AND SCREEN
ABO/RH(D): A POS
Antibody Screen: NEGATIVE

## 2023-05-15 LAB — CHLAMYDIA/NGC RT PCR (ARMC ONLY)
Chlamydia Tr: NOT DETECTED
N gonorrhoeae: NOT DETECTED

## 2023-05-15 LAB — RPR: RPR Ser Ql: NONREACTIVE

## 2023-05-15 LAB — WET PREP, GENITAL
Clue Cells Wet Prep HPF POC: NONE SEEN
Sperm: NONE SEEN
Trich, Wet Prep: NONE SEEN
WBC, Wet Prep HPF POC: <10 (ref <10)
Yeast Wet Prep HPF POC: NONE SEEN

## 2023-05-15 SURGERY — Surgical Case
Anesthesia: Spinal | Laterality: Bilateral

## 2023-05-15 MED ORDER — ONDANSETRON HCL 4 MG/2ML IJ SOLN
INTRAMUSCULAR | Status: AC
  Filled 2023-05-15: qty 2

## 2023-05-15 MED ORDER — MENTHOL 3 MG MT LOZG: 1.0000 | LOZENGE | OROMUCOSAL | Status: DC | PRN

## 2023-05-15 MED ORDER — MEPERIDINE HCL 25 MG/ML IJ SOLN: 6.2500 mg | INTRAMUSCULAR | Status: DC | PRN

## 2023-05-15 MED ORDER — SENNOSIDES-DOCUSATE SODIUM 8.6-50 MG PO TABS
2.0000 | ORAL_TABLET | ORAL | Status: DC
  Administered 2023-05-15 – 2023-05-17 (×3): 2 via ORAL
  Filled 2023-05-15 (×3): qty 2

## 2023-05-15 MED ORDER — FENTANYL CITRATE (PF) 100 MCG/2ML IJ SOLN
INTRAMUSCULAR | Status: DC | PRN
  Administered 2023-05-15: 15 ug via INTRAVENOUS

## 2023-05-15 MED ORDER — GABAPENTIN 300 MG PO CAPS
300.0000 mg | ORAL_CAPSULE | Freq: Every day | ORAL | Status: DC
  Administered 2023-05-15 – 2023-05-17 (×3): 300 mg via ORAL
  Filled 2023-05-15 (×3): qty 1

## 2023-05-15 MED ORDER — OXYTOCIN-SODIUM CHLORIDE 30-0.9 UT/500ML-% IV SOLN: 2.5000 [IU]/h | INTRAVENOUS | Status: AC

## 2023-05-15 MED ORDER — KETOROLAC TROMETHAMINE 30 MG/ML IJ SOLN
30.0000 mg | Freq: Four times a day (QID) | INTRAMUSCULAR | Status: AC
  Administered 2023-05-15 – 2023-05-16 (×4): 30 mg via INTRAVENOUS
  Filled 2023-05-15 (×4): qty 1

## 2023-05-15 MED ORDER — NALOXONE HCL 4 MG/10ML IJ SOLN: 1.0000 ug/kg/h | INTRAVENOUS | Status: DC | PRN

## 2023-05-15 MED ORDER — KETOROLAC TROMETHAMINE 30 MG/ML IJ SOLN
INTRAMUSCULAR | Status: AC
  Filled 2023-05-15: qty 1

## 2023-05-15 MED ORDER — LACTATED RINGERS IV SOLN: INTRAVENOUS | Status: DC

## 2023-05-15 MED ORDER — FENTANYL CITRATE (PF) 100 MCG/2ML IJ SOLN: 25.0000 ug | INTRAMUSCULAR | Status: DC | PRN

## 2023-05-15 MED ORDER — WITCH HAZEL-GLYCERIN EX PADS: 1.0000 | MEDICATED_PAD | CUTANEOUS | Status: DC | PRN

## 2023-05-15 MED ORDER — SCOPOLAMINE 1 MG/3DAYS TD PT72
1.0000 | MEDICATED_PATCH | TRANSDERMAL | Status: DC
  Administered 2023-05-15: 1.5 mg via TRANSDERMAL
  Filled 2023-05-15: qty 1

## 2023-05-15 MED ORDER — PHENYLEPHRINE HCL-NACL 20-0.9 MG/250ML-% IV SOLN
INTRAVENOUS | Status: AC
  Filled 2023-05-15: qty 250

## 2023-05-15 MED ORDER — PHENYLEPHRINE 80 MCG/ML (10ML) SYRINGE FOR IV PUSH (FOR BLOOD PRESSURE SUPPORT)
PREFILLED_SYRINGE | INTRAVENOUS | Status: DC | PRN
  Administered 2023-05-15: 80 ug via INTRAVENOUS
  Administered 2023-05-15 (×2): 160 ug via INTRAVENOUS
  Administered 2023-05-15: 80 ug via INTRAVENOUS

## 2023-05-15 MED ORDER — ACETAMINOPHEN 325 MG PO TABS
650.0000 mg | ORAL_TABLET | ORAL | Status: DC | PRN
Start: 2023-05-15 — End: 2023-05-15

## 2023-05-15 MED ORDER — OXYCODONE HCL 5 MG PO TABS
5.0000 mg | ORAL_TABLET | ORAL | Status: DC | PRN
  Administered 2023-05-16: 10 mg via ORAL
  Administered 2023-05-16: 5 mg via ORAL
  Administered 2023-05-17: 10 mg via ORAL
  Administered 2023-05-17: 5 mg via ORAL
  Filled 2023-05-15: qty 2
  Filled 2023-05-15 (×2): qty 1
  Filled 2023-05-15: qty 2

## 2023-05-15 MED ORDER — FAMOTIDINE 20 MG PO TABS
ORAL_TABLET | ORAL | Status: AC
  Filled 2023-05-15: qty 1

## 2023-05-15 MED ORDER — LACTATED RINGERS IV BOLUS
500.0000 mL | Freq: Once | INTRAVENOUS | Status: AC
  Administered 2023-05-15: 500 mL via INTRAVENOUS

## 2023-05-15 MED ORDER — MORPHINE SULFATE (PF) 0.5 MG/ML IJ SOLN
INTRAMUSCULAR | Status: AC
  Filled 2023-05-15: qty 10

## 2023-05-15 MED ORDER — KETOROLAC TROMETHAMINE 30 MG/ML IJ SOLN: 30.0000 mg | Freq: Four times a day (QID) | INTRAMUSCULAR | Status: AC | PRN

## 2023-05-15 MED ORDER — LACTATED RINGERS IV SOLN: 500.0000 mL | INTRAVENOUS | Status: DC | PRN

## 2023-05-15 MED ORDER — EPINEPHRINE 0.3 MG/0.3ML IJ SOAJ: 0.3000 mg | INTRAMUSCULAR | Status: DC | PRN

## 2023-05-15 MED ORDER — FAMOTIDINE 20 MG PO TABS
20.0000 mg | ORAL_TABLET | Freq: Every day | ORAL | Status: AC
  Administered 2023-05-15: 20 mg via ORAL

## 2023-05-15 MED ORDER — OXYTOCIN-SODIUM CHLORIDE 30-0.9 UT/500ML-% IV SOLN
2.5000 [IU]/h | INTRAVENOUS | Status: DC
  Administered 2023-05-15: 600 m[IU]/h via INTRAVENOUS

## 2023-05-15 MED ORDER — SOD CITRATE-CITRIC ACID 500-334 MG/5ML PO SOLN
ORAL | Status: AC
  Filled 2023-05-15: qty 15

## 2023-05-15 MED ORDER — PRENATAL MULTIVITAMIN CH
1.0000 | ORAL_TABLET | Freq: Every day | ORAL | Status: DC
  Administered 2023-05-16 – 2023-05-18 (×3): 1 via ORAL
  Filled 2023-05-15 (×3): qty 1

## 2023-05-15 MED ORDER — COCONUT OIL OIL
1.0000 | TOPICAL_OIL | Status: DC | PRN
  Filled 2023-05-15: qty 7.5

## 2023-05-15 MED ORDER — LIDOCAINE HCL (PF) 1 % IJ SOLN
INTRAMUSCULAR | Status: DC | PRN
  Administered 2023-05-15: 3 mL via SUBCUTANEOUS

## 2023-05-15 MED ORDER — SODIUM CHLORIDE 0.9 % IV SOLN
500.0000 mg | INTRAVENOUS | Status: AC
  Administered 2023-05-15: 500 mg via INTRAVENOUS
  Filled 2023-05-15: qty 5

## 2023-05-15 MED ORDER — DIPHENHYDRAMINE HCL 25 MG PO CAPS: 25.0000 mg | ORAL_CAPSULE | ORAL | Status: DC | PRN

## 2023-05-15 MED ORDER — DIPHENHYDRAMINE HCL 50 MG/ML IJ SOLN
INTRAMUSCULAR | Status: DC | PRN
Start: 2023-05-15 — End: 2023-05-15
  Administered 2023-05-15: 25 mg via INTRAVENOUS

## 2023-05-15 MED ORDER — SIMETHICONE 80 MG PO CHEW
80.0000 mg | CHEWABLE_TABLET | Freq: Three times a day (TID) | ORAL | Status: DC
  Administered 2023-05-16 – 2023-05-18 (×7): 80 mg via ORAL
  Filled 2023-05-15 (×8): qty 1

## 2023-05-15 MED ORDER — SOD CITRATE-CITRIC ACID 500-334 MG/5ML PO SOLN: 30.0000 mL | ORAL | Status: DC | PRN

## 2023-05-15 MED ORDER — PHENYLEPHRINE HCL-NACL 20-0.9 MG/250ML-% IV SOLN
INTRAVENOUS | Status: DC | PRN
  Administered 2023-05-15: 50 ug/min via INTRAVENOUS

## 2023-05-15 MED ORDER — SODIUM CHLORIDE 0.9 % IV SOLN
25.0000 mg | Freq: Once | INTRAVENOUS | Status: AC
  Administered 2023-05-15: 25 mg via INTRAVENOUS
  Filled 2023-05-15: qty 1

## 2023-05-15 MED ORDER — BUPIVACAINE HCL (PF) 0.25 % IJ SOLN
INTRAMUSCULAR | Status: AC
  Filled 2023-05-15: qty 60

## 2023-05-15 MED ORDER — TERBUTALINE SULFATE 1 MG/ML IJ SOLN
INTRAMUSCULAR | Status: AC
  Filled 2023-05-15: qty 1

## 2023-05-15 MED ORDER — BISACODYL 10 MG RE SUPP: 10.0000 mg | Freq: Every day | RECTAL | Status: DC | PRN

## 2023-05-15 MED ORDER — ACETAMINOPHEN 500 MG PO TABS
1000.0000 mg | ORAL_TABLET | Freq: Four times a day (QID) | ORAL | Status: DC
  Administered 2023-05-15 – 2023-05-16 (×3): 1000 mg via ORAL
  Filled 2023-05-15 (×3): qty 2

## 2023-05-15 MED ORDER — 0.9 % SODIUM CHLORIDE (POUR BTL) OPTIME
TOPICAL | Status: DC | PRN
  Administered 2023-05-15: 500 mL

## 2023-05-15 MED ORDER — LACTATED RINGERS IV BOLUS
1000.0000 mL | Freq: Once | INTRAVENOUS | Status: AC
  Administered 2023-05-15: 1000 mL via INTRAVENOUS

## 2023-05-15 MED ORDER — BUPIVACAINE HCL (PF) 0.25 % IJ SOLN
INTRAMUSCULAR | Status: DC | PRN
  Administered 2023-05-15: 60 mL

## 2023-05-15 MED ORDER — FLEET ENEMA RE ENEM: 1.0000 | ENEMA | Freq: Every day | RECTAL | Status: DC | PRN

## 2023-05-15 MED ORDER — ONDANSETRON HCL 4 MG/2ML IJ SOLN
INTRAMUSCULAR | Status: DC | PRN
  Administered 2023-05-15: 4 mg via INTRAVENOUS

## 2023-05-15 MED ORDER — ONDANSETRON HCL 4 MG/2ML IJ SOLN
4.0000 mg | Freq: Three times a day (TID) | INTRAMUSCULAR | Status: DC | PRN
  Administered 2023-05-15: 4 mg via INTRAVENOUS
  Filled 2023-05-15: qty 2

## 2023-05-15 MED ORDER — DIPHENHYDRAMINE HCL 50 MG/ML IJ SOLN
INTRAMUSCULAR | Status: AC
  Filled 2023-05-15: qty 1

## 2023-05-15 MED ORDER — SODIUM CHLORIDE 0.9% FLUSH: 3.0000 mL | INTRAVENOUS | Status: DC | PRN

## 2023-05-15 MED ORDER — KETOROLAC TROMETHAMINE 30 MG/ML IJ SOLN
INTRAMUSCULAR | Status: DC | PRN
  Administered 2023-05-15: 30 mg via INTRAVENOUS

## 2023-05-15 MED ORDER — ALUM & MAG HYDROXIDE-SIMETH 200-200-20 MG/5ML PO SUSP: 10.0000 mL | Freq: Four times a day (QID) | ORAL | Status: DC | PRN

## 2023-05-15 MED ORDER — DIBUCAINE (PERIANAL) 1 % EX OINT: 1.0000 | TOPICAL_OINTMENT | CUTANEOUS | Status: DC | PRN

## 2023-05-15 MED ORDER — NALOXONE HCL 0.4 MG/ML IJ SOLN: 0.4000 mg | INTRAMUSCULAR | Status: DC | PRN

## 2023-05-15 MED ORDER — CHLORHEXIDINE GLUCONATE 0.12 % MT SOLN
OROMUCOSAL | Status: AC
  Administered 2023-05-15: 15 mL
  Filled 2023-05-15: qty 15

## 2023-05-15 MED ORDER — CEFAZOLIN SODIUM-DEXTROSE 2-4 GM/100ML-% IV SOLN
2.0000 g | INTRAVENOUS | Status: AC
  Administered 2023-05-15: 2 g via INTRAVENOUS

## 2023-05-15 MED ORDER — CEFAZOLIN SODIUM-DEXTROSE 2-4 GM/100ML-% IV SOLN
INTRAVENOUS | Status: AC
  Filled 2023-05-15: qty 100

## 2023-05-15 MED ORDER — DIPHENHYDRAMINE HCL 25 MG PO CAPS: 25.0000 mg | ORAL_CAPSULE | Freq: Four times a day (QID) | ORAL | Status: DC | PRN

## 2023-05-15 MED ORDER — LIDOCAINE HCL (PF) 1 % IJ SOLN: 30.0000 mL | INTRAMUSCULAR | Status: DC | PRN

## 2023-05-15 MED ORDER — DIPHENHYDRAMINE HCL 50 MG/ML IJ SOLN: 12.5000 mg | INTRAMUSCULAR | Status: DC | PRN

## 2023-05-15 MED ORDER — NIFEDIPINE 10 MG PO CAPS
10.0000 mg | ORAL_CAPSULE | Freq: Once | ORAL | Status: AC
  Administered 2023-05-15: 10 mg via ORAL
  Filled 2023-05-15: qty 1

## 2023-05-15 MED ORDER — FENTANYL CITRATE (PF) 100 MCG/2ML IJ SOLN
INTRAMUSCULAR | Status: AC
  Filled 2023-05-15: qty 2

## 2023-05-15 MED ORDER — MEASLES, MUMPS & RUBELLA VAC IJ SOLR
0.5000 mL | Freq: Once | INTRAMUSCULAR | Status: AC
  Administered 2023-05-18: 0.5 mL via SUBCUTANEOUS
  Filled 2023-05-15 (×2): qty 0.5

## 2023-05-15 MED ORDER — MORPHINE SULFATE (PF) 0.5 MG/ML IJ SOLN
INTRAMUSCULAR | Status: DC | PRN
  Administered 2023-05-15: .1 mg via EPIDURAL

## 2023-05-15 MED ORDER — SUCRALFATE 1 GM/10ML PO SUSP
1.0000 g | Freq: Four times a day (QID) | ORAL | Status: DC
  Administered 2023-05-15 – 2023-05-18 (×10): 1 g via ORAL
  Filled 2023-05-15 (×14): qty 10

## 2023-05-15 MED ORDER — TERBUTALINE SULFATE 1 MG/ML IJ SOLN
0.2500 mg | INTRAMUSCULAR | Status: AC
  Administered 2023-05-15: 0.25 mg via SUBCUTANEOUS

## 2023-05-15 MED ORDER — TETANUS-DIPHTH-ACELL PERTUSSIS 5-2.5-18.5 LF-MCG/0.5 IM SUSY: 0.5000 mL | PREFILLED_SYRINGE | Freq: Once | INTRAMUSCULAR | Status: DC

## 2023-05-15 MED ORDER — IBUPROFEN 600 MG PO TABS
600.0000 mg | ORAL_TABLET | Freq: Four times a day (QID) | ORAL | Status: DC
  Administered 2023-05-16 – 2023-05-18 (×8): 600 mg via ORAL
  Filled 2023-05-15 (×8): qty 1

## 2023-05-15 MED ORDER — OXYTOCIN-SODIUM CHLORIDE 30-0.9 UT/500ML-% IV SOLN
INTRAVENOUS | Status: AC
Start: 2023-05-15 — End: 2023-05-15
  Filled 2023-05-15: qty 500

## 2023-05-15 MED ORDER — SIMETHICONE 80 MG PO CHEW: 80.0000 mg | CHEWABLE_TABLET | ORAL | Status: DC | PRN

## 2023-05-15 MED ORDER — SOD CITRATE-CITRIC ACID 500-334 MG/5ML PO SOLN
30.0000 mL | ORAL | Status: AC
  Administered 2023-05-15: 30 mL via ORAL

## 2023-05-15 MED ORDER — BUPIVACAINE IN DEXTROSE 0.75-8.25 % IT SOLN
INTRATHECAL | Status: DC | PRN
  Administered 2023-05-15: 1.6 mL via INTRATHECAL

## 2023-05-15 MED ORDER — OXYTOCIN BOLUS FROM INFUSION: 333.0000 mL | Freq: Once | INTRAVENOUS | Status: DC

## 2023-05-15 MED ORDER — ACETAMINOPHEN 500 MG PO TABS: 1000.0000 mg | ORAL_TABLET | Freq: Every day | ORAL | Status: DC

## 2023-05-15 MED ORDER — PHENYLEPHRINE 80 MCG/ML (10ML) SYRINGE FOR IV PUSH (FOR BLOOD PRESSURE SUPPORT)
PREFILLED_SYRINGE | INTRAVENOUS | Status: AC
Start: 2023-05-15 — End: ?
  Filled 2023-05-15: qty 10

## 2023-05-15 MED ORDER — FERROUS SULFATE 325 (65 FE) MG PO TABS
325.0000 mg | ORAL_TABLET | Freq: Two times a day (BID) | ORAL | Status: DC
  Administered 2023-05-16 – 2023-05-18 (×5): 325 mg via ORAL
  Filled 2023-05-15 (×7): qty 1

## 2023-05-15 MED ORDER — OXYCODONE HCL 5 MG PO TABS: 5.0000 mg | ORAL_TABLET | Freq: Four times a day (QID) | ORAL | Status: DC | PRN

## 2023-05-15 MED ORDER — ONDANSETRON HCL 4 MG/2ML IJ SOLN: 4.0000 mg | Freq: Four times a day (QID) | INTRAMUSCULAR | Status: DC | PRN

## 2023-05-15 SURGICAL SUPPLY — 28 items
BARRIER ADHS 3X4 INTERCEED (GAUZE/BANDAGES/DRESSINGS) IMPLANT
CHLORAPREP W/TINT 26 (MISCELLANEOUS) ×1 IMPLANT
DRESSING TELFA 8X10 (GAUZE/BANDAGES/DRESSINGS) IMPLANT
DRSG TELFA 3X8 NADH STRL (GAUZE/BANDAGES/DRESSINGS) ×1 IMPLANT
ELECT REM PT RETURN 9FT ADLT (ELECTROSURGICAL) ×1 IMPLANT
ELECTRODE REM PT RTRN 9FT ADLT (ELECTROSURGICAL) ×1 IMPLANT
GAUZE SPONGE 4X4 12PLY STRL (GAUZE/BANDAGES/DRESSINGS) ×1 IMPLANT
GAUZE SPONGE 4X4 8PLY STRL (GAUZE/BANDAGES/DRESSINGS) IMPLANT
GOWN STRL REUS W/ TWL LRG LVL3 (GOWN DISPOSABLE) ×3 IMPLANT
MANIFOLD NEPTUNE II (INSTRUMENTS) ×1 IMPLANT
MAT PREVALON FULL STRYKER (MISCELLANEOUS) ×1 IMPLANT
NDL HYPO 25GX1X1/2 BEV (NEEDLE) ×1 IMPLANT
NEEDLE HYPO 25GX1X1/2 BEV (NEEDLE) ×1 IMPLANT
NS IRRIG 1000ML POUR BTL (IV SOLUTION) ×1 IMPLANT
PACK C SECTION AR (MISCELLANEOUS) ×1 IMPLANT
PAD OB MATERNITY 11 LF (PERSONAL CARE ITEMS) ×1 IMPLANT
PAD PREP OB/GYN DISP 24X41 (PERSONAL CARE ITEMS) ×1 IMPLANT
SCRUB CHG 4% DYNA-HEX 4OZ (MISCELLANEOUS) ×1 IMPLANT
STAPLER INSORB 30 2030 C-SECTI (MISCELLANEOUS) IMPLANT
SUT MNCRL 4-0 27 PS-2 XMFL (SUTURE) ×1 IMPLANT
SUT VIC AB 0 CT1 36 (SUTURE) ×2 IMPLANT
SUT VIC AB 0 CTX36XBRD ANBCTRL (SUTURE) ×2 IMPLANT
SUT VIC AB 2-0 SH 27XBRD (SUTURE) ×2 IMPLANT
SUTURE MNCRL 4-0 27XMF (SUTURE) ×1 IMPLANT
SYR 30ML LL (SYRINGE) ×2 IMPLANT
TAPE TRANSPORE STRL 2 31045 (GAUZE/BANDAGES/DRESSINGS) IMPLANT
TRAP FLUID SMOKE EVACUATOR (MISCELLANEOUS) ×1 IMPLANT
WATER STERILE IRR 500ML POUR (IV SOLUTION) ×1 IMPLANT
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
## 2023-05-15 NOTE — Discharge Summary (Shared)
 Obstetrical Discharge Summary  Patient Name: Tiffany Andrews DOB: 1995-03-10 MRN: [id number]  Date of Admission: 05/15/2023 Date of Delivery: 05/15/23 Delivered by: Christeen Douglas, MD MPH Date of Discharge: 05/15/2023  [facility]: [id number] last menstrual period was 09/17/2022. EDC Estimated Date of Delivery: 06/24/23 Gestational Age at Delivery: [redacted]w[redacted]d   Antepartum complications:  1. Preterm uterine contractions 2. Hx of cesarean section for breech 3. Hx of breech 4. GDM 5. Hx of midline colon resection 6. Hx of ectopic pregnancy 7. Hx of depression  8. SVT, a. Fib 9. Rubella and varicella non-immune Undesired fertility  Admitting Diagnosis: Uterine contractions [O47.9] Pregnancy [Z34.90] Preterm contractions [O47.00]  Secondary Diagnosis: Patient Active Problem List   Diagnosis Date Noted   Uterine contractions 05/15/2023   Pregnancy 05/15/2023   Preterm contractions 05/15/2023   Preterm uterine contractions 05/01/2023   Threatened preterm labor, antepartum 04/24/2023   Pelvic pressure in pregnancy 03/14/2023   Gestational diabetes, diet controlled 03/06/2023   Maternal varicella, non-immune 02/11/2023   Rubella non-immune status, antepartum 02/11/2023   Maternal iron deficiency anemia affecting pregnancy in second trimester, antepartum 02/11/2023   UTI (urinary tract infection) 02/10/2023   Hyperemesis gravidarum 01/02/2023   Obesity affecting pregnancy in first trimester 01/02/2023   Depression affecting pregnancy in second trimester, antepartum 01/02/2023   History of cesarean section 01/02/2023   Supervision of high risk pregnancy in third trimester 12/05/2022   PAF (paroxysmal atrial fibrillation) (HCC) 01/13/2022   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   Vasovagal syncope 07/07/2021   Anxiety 04/01/2021   SVT (supraventricular tachycardia) (HCC) 04/01/2021    Discharge Diagnosis: Preterm Pregnancy Delivered and GDM A1      Intrapartum  complications/course: Presented preterm at 34 weeks in active labor for repeat cesarean section Delivery Type: repeat cesarean section, low transverse incision Anesthesia: spinal anesthesia Placenta: manual removal To Pathology: No  Laceration: n/a Episiotomy: none Newborn Data: Live born female "[name] Bree" Birth Weight: 5 lb 5.4 oz (2420 g) APGAR: 8, 9  Newborn Delivery   Birth date/time: 05/15/2023 11:37:00 Delivery type: C-Section, Low Transverse Trial of labor: No C-section categorization: Repeat     Postpartum Procedures: {postpartum procedures:3041411} Edinburgh:     08/22/2022    2:40 PM 05/11/2018   12:10 PM 04/11/2018    1:52 PM 02/22/2018    8:59 AM 11/15/2017    3:55 PM  Edinburgh Postnatal Depression Scale Screening Tool  I have been able to laugh and see the funny side of things. 0 1 1 2 1   I have looked forward with enjoyment to things. 0 0 1 1 1   I have blamed myself unnecessarily when things went wrong. 1 1 3 3 1   I have been anxious or worried for no good reason. 0 2 3 3 2   I have felt scared or panicky for no good reason. 0 2 1 2 1   Things have been getting on top of me. 0 1 2 3 2   I have been so unhappy that I have had difficulty sleeping. 2 1 2 3 1   I have felt sad or miserable. 1 1 2 2 1   I have been so unhappy that I have been crying. 1 1 2 2 1   The thought of harming myself has occurred to me. 0 1 2 2  0  Edinburgh Postnatal Depression Scale Total 5 11 19 23 11      Post partum course:  ***(Cesarean Section):  Patient had an uncomplicated postpartum course.  By time of  discharge on POD#***, her pain was controlled on oral pain medications; she had appropriate lochia and was ambulating, voiding without difficulty, tolerating regular diet and passing flatus.   She was deemed stable for discharge to home.    Discharge Physical Exam: *** BP 120/63   Pulse 96   Temp 98.2 F (36.8 C) (Oral)   LMP 09/17/2022   Breastfeeding Unknown   General: NAD CV:  RRR Pulm: CTABL, nl effort ABD: s/nd/nt, fundus firm and below the umbilicus Lochia: moderate Perineum:***minimal edema/{OB Perineal assessment:24215} Incision: c/d/I, covered with occlusive OP site dressing *** DVT Evaluation: LE non-ttp, no evidence of DVT on exam.  Hemoglobin  Date Value Ref Range Status  05/15/2023 11.7 (L) 12.0 - 15.0 g/dL Final  82/95/6213 08.6 11.1 - 15.9 g/dL Final  57/84/6962 95.2  Final   HCT  Date Value Ref Range Status  05/15/2023 33.4 (L) 36.0 - 46.0 % Final  03/01/2017 38  Final   Hematocrit  Date Value Ref Range Status  11/16/2017 37.4 34.0 - 46.6 % Final    Risk assessment for postpartum VTE and prophylactic treatment: Very high risk factors: None High risk factors: If 1 risk factor, mechanical prophylaxis and early ambulation  and Unscheduled cesarean after labor  Moderate risk factors: BMI 30-40 kg/m2  Postpartum VTE prophylaxis with LMWH not indicated  Disposition: stable, discharge to home. Baby Feeding: breast feeding Baby Disposition: home with mom  Rh Immune globulin indicated: No Rubella vaccine given: was not indicated Varivax vaccine given: was not indicated Flu vaccine given in AP setting: Yes  Tdap vaccine given in AP setting: Yes   Contraception: bilateral tubal ligation  Prenatal Labs:  Blood type/Rh A pos Antibody screen neg Rubella Non-Immune Varicella Non-Immune RPR NR HBsAg Neg HIV NR GC neg Chlamydia neg Genetic screening negative 1 hour GTT Checking sugars, GDM diagnosis 3 hour GTT   GBS Negative by PCR   (copy from H&P)  Plan:  Shresta M Dray was discharged to home in good condition. Follow-up appointment with delivering provider in 6 weeks.***  Discharge Medications: Allergies as of 05/15/2023       Reactions   Bee Venom Hives, Itching   Iodinated Contrast Media Shortness Of Breath, Nausea Only   Pt experienced nausea, severe facial tingling/swelling, feeling as though throat was  tight and closing up, difficulty breathing, chest pressure and tightness    Other Anaphylaxis, Swelling, Other (See Comments)   Stinging insects   Shellfish Allergy Anaphylaxis, Swelling   Latex    Other reaction(s): Unknown   Hydromorphone Anxiety, Itching     Med Rec must be completed prior to using this SMARTLINK***         Signed: *** Hit refresh and delete this line

## 2023-05-15 NOTE — OB Triage Note (Signed)
 Patient is G4P1 [redacted]w[redacted]d is seen at [facility] clinic . Patient is c/o contractions that started around 1249 am this morning, pt called provider line and was instructed different methods to resolve said contractions. Patient stated the contractions continued, patient then came into hospital. Patient denies LOF or any s/s of UTI, denies bleeding. VSS. Monitors applied and provider to be notified. Water given to pt.

## 2023-05-15 NOTE — Op Note (Signed)
 Cesarean Section Procedure Note  Date of procedure: 05/15/2023   Pre-operative Diagnosis: Intrauterine pregnancy at [redacted]w[redacted]d; preterm labor - prior cesarean section - complex surgical hx, including lapartomy with colon resection and left salpingectomy for prior ectopic - gestational diabetes - desires sterilization  Post-operative Diagnosis: same, delivered.  Procedure: Repeat Low Transverse Cesarean Section through Pfannenstiel incision with bilateral tubal ligation  Surgeon: Christeen Douglas, MD  Assistant(s):  Donato Schultz, CNM   An experienced assistant was required given the standard of surgical care given the complexity of the case.  This assistant was needed for exposure, dissection, suctioning, retraction, instrument exchange,  CNM assisting with delivery with administration of fundal pressure, and for overall help during the procedure.   Anesthesia: Spinal anesthesia  Anesthesiologist: Lenard Simmer, MD Anesthesiologist: Lenard Simmer, MD CRNA: Omer Jack, CRNA  Estimated Blood Loss:           Drains: Foley         Total IV Fluids:  Urine Output:         Specimens: right fallopian tube          Complications:  None; patient tolerated the procedure well.         Disposition: PACU - hemodynamically stable.         Condition: stable  Findings:  A female infant "[name] Bree" in cephalic presentation. Amniotic fluid - Clear  Birth weight 2420 g.   APGAR (1 MIN): 8  APGAR (5 MINS): 9  APGAR (10 MINS):     Intact placenta with a three-vessel cord.  Grossly normal uterus, tubes and ovaries bilaterally. Right sided intraabdominal adhesions were noted.  Indications:  prior cesarean section  Procedure Details  The patient was taken to Operating Room, identified as the correct patient and the procedure verified as C-Section Delivery. A formal Time Out was held with all team members present and in agreement.  After induction of anesthesia,  the patient was draped and prepped in the usual sterile manner. A Pfannenstiel skin incision was made and carried down through the subcutaneous tissue to the fascia. Fascial incision was made and extended transversely with the Mayo scissors. The fascia was separated from the underlying rectus tissue superiorly and inferiorly. The peritoneum was identified and entered bluntly. Peritoneal incision was extended longitudinally. The utero-vesical peritoneal reflection was incised transversely and a bladder flap was created digitally.   A low transverse hysterotomy was made. The fetus was delivered atraumatically and 1 minute of delayed cord clamping. The umbilical cord was clamped x2 and cut and the infant was handed to the awaiting pediatricians. The placenta was removed intact and appeared normal, intact, and with a 3-vessel cord.   The uterus was exteriorized and cleared of all clot and debris. The hysterotomy was closed with running sutures of 0-Vicryl. Excellent hemostasis was observed.  Right salpingectomy performed in two stages by modified [place] method, including the fimbrated ends  The peritoneal cavity was cleared of all clots and debris. The uterus was returned to the abdomen.   Gutters and pelvis were evaluated and excellent hemostasis was noted. The fascia was then reapproximated with running sutures of 0 Maxon.  The subcutaneous tissue was reapproximated with running sutures of 0 Vicryl. The skin was reapproximated with Ensorb absorbable staples. 60 of 0.25% bupivicaine was placed in the fascial and skin lines.  Instrument, sponge, and needle counts were correct prior to the abdominal closure and at the conclusion of the case.   The patient tolerated the  procedure well and was transferred to the recovery room in stable condition.   Christeen Douglas, MD 05/15/2023

## 2023-05-15 NOTE — Progress Notes (Signed)
 [id number] at [redacted]w[redacted]d in active labor, now 6cm, planned repeat c/s with BTL - prior left salpingectomy for ectopic.   Hx of multiple surgeries: appendectomy, cesarean section, cholecystectomy, laparotomy with a right hemicolectomy, left salpingectomy for ectopic pregnancy with me on 09/2022. Adhesions describe in the right lower quadrant with omental adhesions noted.  The risks of cesarean section discussed with the patient included but were not limited to: bleeding which may require transfusion or reoperation; infection which may require antibiotics; injury to bowel, bladder, ureters or other surrounding organs; injury to the fetus; need for additional procedures including hysterectomy in the event of a life-threatening hemorrhage; placental abnormalities wth subsequent pregnancies, incisional problems, thromboembolic phenomenon and other postoperative/anesthesia complications. The patient concurred with the proposed plan, giving informed written consent for the procedure. Anesthesia and OR aware. Preoperative prophylactic antibiotics and SCDs ordered on call to the OR.  To OR when ready.  Patient desires permanent sterilization.  Other reversible forms of contraception were discussed with patient; she declines all other modalities. Risks of procedure discussed with patient including but not limited to: risk of regret, permanence of method, bleeding, infection, injury to surrounding organs and need for additional procedures.   Tubal ligation
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
## 2023-05-15 NOTE — Transfer of Care (Signed)
 Immediate Anesthesia Transfer of Care Note  Patient: Tiffany Andrews  Procedure(s) Performed: REPEAT CESAREAN SECTION WITH BILATERAL TUBAL LIGATION (Bilateral)  Patient Location: PACU and Mother/Baby  Anesthesia Type:Spinal  Level of Consciousness: awake, alert , and oriented  Airway & Oxygen Therapy: Patient Spontanous Breathing  Post-op Assessment: Report given to RN and Post -op Vital signs reviewed and stable  Post vital signs: Reviewed and stable  Last Vitals:  Vitals Value Taken Time  BP 103/75 05/15/23 1229  Temp    Pulse    Resp 16 05/15/23 1229  SpO2 100 % 05/15/23 1229    Last Pain:  Vitals:   05/15/23 0556  TempSrc:   PainSc: 8          Complications: No notable events documented.
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
## 2023-05-16 ENCOUNTER — Encounter: Payer: Self-pay | Admitting: Obstetrics and Gynecology

## 2023-05-16 LAB — CBC
HCT: 27.8 % — ABNORMAL LOW (ref 36.0–46.0)
Hemoglobin: 9.4 g/dL — ABNORMAL LOW (ref 12.0–15.0)
MCH: 30.9 pg (ref 26.0–34.0)
MCHC: 33.8 g/dL (ref 30.0–36.0)
MCV: 91.4 fL (ref 80.0–100.0)
Platelets: 189 10*3/uL (ref 150–400)
RBC: 3.04 MIL/uL — ABNORMAL LOW (ref 3.87–5.11)
RDW: 13.2 % (ref 11.5–15.5)
WBC: 10.2 10*3/uL (ref 4.0–10.5)
nRBC: 0 % (ref 0.0–0.2)

## 2023-05-16 LAB — SURGICAL PATHOLOGY

## 2023-05-16 MED ORDER — LACTATED RINGERS IV BOLUS
1000.0000 mL | Freq: Once | INTRAVENOUS | Status: AC
  Administered 2023-05-16: 1000 mL via INTRAVENOUS

## 2023-05-16 MED ORDER — ACETAMINOPHEN 500 MG PO TABS
1000.0000 mg | ORAL_TABLET | Freq: Four times a day (QID) | ORAL | Status: DC
  Administered 2023-05-16 – 2023-05-18 (×8): 1000 mg via ORAL
  Filled 2023-05-16 (×9): qty 2

## 2023-05-16 MED ORDER — LACTATED RINGERS IV BOLUS
500.0000 mL | Freq: Once | INTRAVENOUS | Status: AC
  Administered 2023-05-16: 500 mL via INTRAVENOUS

## 2023-05-16 NOTE — Anesthesia Post-op Follow-up Note (Signed)
  Anesthesia Pain Follow-up Note  Patient: Tiffany Andrews  Day #: 1  Date of Follow-up: 05/16/2023 Time: 8:56 AM  Last Vitals:  Vitals:   05/16/23 0500 05/16/23 0835  BP:  (!) 98/58  Pulse: (!) 47 (!) 59  Resp:  16  Temp:  36.5 C  SpO2: 100% 100%    Level of Consciousness: alert  Pain: none   Side Effects:None  Catheter Site Exam:clean, dry, no drainage     Plan: D/C from anesthesia care at surgeon's request  Elmarie Mainland

## 2023-05-16 NOTE — Anesthesia Postprocedure Evaluation (Signed)
 Anesthesia Post Note  Patient: Raynie M Seivert  Procedure(s) Performed: REPEAT CESAREAN SECTION WITH BILATERAL TUBAL LIGATION (Bilateral)  Patient location during evaluation: Mother Baby Anesthesia Type: Spinal Level of consciousness: oriented and awake and alert Pain management: pain level controlled Vital Signs Assessment: post-procedure vital signs reviewed and stable Respiratory status: spontaneous breathing and respiratory function stable Cardiovascular status: blood pressure returned to baseline and stable Postop Assessment: no headache, no backache, no apparent nausea or vomiting and able to ambulate Anesthetic complications: no  No notable events documented.   Last Vitals:  Vitals:   05/16/23 0500 05/16/23 0835  BP:  (!) 98/58  Pulse: (!) 47 (!) 59  Resp:  16  Temp:  36.5 C  SpO2: 100% 100%    Last Pain:  Vitals:   05/16/23 0314  TempSrc: Oral  PainSc:                  Elmarie Mainland

## 2023-05-16 NOTE — Progress Notes (Signed)
 Postop Day  1  Subjective: 28 y.o. [id number] postpartum day #1 status post repeat cesarean section and tubal ligation was performed. She is ambulating, is tolerating po, is not voiding spontaneously.  Her pain is well controlled on PO pain medications. Her lochia is less than menses.  Objective: BP (!) 98/58   Pulse (!) 59   Temp 97.7 F (36.5 C)   Resp 16   Ht 5\' 8"  (1.727 m)   LMP 09/17/2022   SpO2 100%   Breastfeeding Unknown   BMI 33.83 kg/m    Physical Exam:  General: alert, cooperative, and appears stated age Breasts: soft/nontender Pulm: nl effort Abdomen: soft, non-tender, active bowel sounds Uterine Fundus: firm Incision: healing well, no significant drainage, no dehiscence, no significant erythema Perineum: minimal edema, intact Lochia: appropriate DVT Evaluation: No evidence of DVT seen on physical exam. Negative Homan's sign. No cords or calf tenderness. No significant calf/ankle edema.  Recent Labs    05/15/23 0629 05/16/23 0417  HGB 11.7* 9.4*  HCT 33.4* 27.8*  WBC 11.7* 10.2  PLT 224 189    Assessment/Plan: 27 y.o. [id number] Postop Day  1  1. Continue routine postpartum care  2. Infant feeding status: expressed breast milk --Lactation consult PRN for breastfeeding   3. Contraception plan: bilateral tubal ligation  4. Acute blood loss anemia - clinically significant.  --Hemodynamically stable and asymptomatic --Intervention: continue on oral supplementation with ferrous sulfate 325  5. Immunization status:   all immunizations up to date  6. Scant urine output - 1000 ml LR bolus - 2 hour void trial -bladder scan for PVR  Disposition: continue inpatient postpartum care    LOS: 1 day   Zavien Clubb, CNM 05/16/2023, 9:01 AM   ----- Chari Manning Certified Nurse Midwife [facility]

## 2023-05-16 NOTE — Lactation Note (Signed)
 This note was copied from a baby's chart. Lactation Consultation Note  Patient Name: Tiffany Andrews [id number] Date: 05/16/2023 Age:27 hours Reason for consult: Follow-up assessment;Late-preterm 41-36.6wks  Mother at 25 hrs PP. Infant "[name]" in SCN.  Maternal Data  Mother states her infant feeding goal is to [facility] and would like to bring "[name]" to breast when possible. Mother states she has pumped 2x, with drops of colostrum being present. Mother states she is feeling better, as she was vomiting yesterday, and feels more comfortable in putting efforts to being on a pumping routine.  Lactation Tools Discussed/Used Tools: (P) 62F feeding tube / Syringe Discussed expectations for "[name]" to BF by explaining what a "Lick and Learn" consists of.  Interventions Interventions: Education;DEBP Discussed DEBP education and how to clean pump parts. Milk production education provided, encouraged to stimulate breast to establish milk supply by maintaining a pumping schedule, of about every 3 hours.  Discharge Pump: DEBP;Personal (Spectra S9 pump at home)  Consult Status Consult Status: Follow-up Follow-up type: In-patient   Chinita Greenland 05/16/2023, 1:03 PM

## 2023-05-17 DIAGNOSIS — R001 Bradycardia, unspecified: Secondary | ICD-10-CM

## 2023-05-17 LAB — BASIC METABOLIC PANEL WITH GFR
Anion gap: 6 (ref 5–15)
BUN: 9 mg/dL (ref 6–20)
CO2: 24 mmol/L (ref 22–32)
Calcium: 8.1 mg/dL — ABNORMAL LOW (ref 8.9–10.3)
Chloride: 105 mmol/L (ref 98–111)
Creatinine, Ser: 0.59 mg/dL (ref 0.44–1.00)
GFR, Estimated: >60 mL/min (ref >60)
Glucose, Bld: 71 mg/dL (ref 70–99)
Potassium: 3.6 mmol/L (ref 3.5–5.1)
Sodium: 135 mmol/L (ref 135–145)

## 2023-05-17 LAB — CBC
HCT: 30.1 % — ABNORMAL LOW (ref 36.0–46.0)
Hemoglobin: 9.9 g/dL — ABNORMAL LOW (ref 12.0–15.0)
MCH: 30.5 pg (ref 26.0–34.0)
MCHC: 32.9 g/dL (ref 30.0–36.0)
MCV: 92.6 fL (ref 80.0–100.0)
Platelets: 184 10*3/uL (ref 150–400)
RBC: 3.25 MIL/uL — ABNORMAL LOW (ref 3.87–5.11)
RDW: 13.2 % (ref 11.5–15.5)
WBC: 8.7 10*3/uL (ref 4.0–10.5)
nRBC: 0 % (ref 0.0–0.2)

## 2023-05-17 LAB — MAGNESIUM: Magnesium: 1.8 mg/dL (ref 1.7–2.4)

## 2023-05-17 MED ORDER — OXYCODONE HCL 5 MG PO TABS
5.0000 mg | ORAL_TABLET | ORAL | Status: DC | PRN
  Administered 2023-05-17: 5 mg via ORAL
  Filled 2023-05-17: qty 1

## 2023-05-17 MED ORDER — LACTATED RINGERS IV BOLUS
500.0000 mL | Freq: Once | INTRAVENOUS | Status: AC
  Administered 2023-05-17: 500 mL via INTRAVENOUS
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
## 2023-05-17 NOTE — Progress Notes (Signed)
 Postop Day  2  Subjective: 28 y.o. [id number] postpartum day #2 status post repeat cesarean section. She is ambulating, is tolerating po, is voiding spontaneously.  Her pain is well controlled on PO pain medications. Her lochia is less than menses.  She reports feeling dizzy and lightheaded this morning, about 2 hours after she received oxycodone for pain. States her vision is blurry and she is having difficulty focusing. Denies SOB, chest pain, or palpitations. Symptoms initially improved when she was in supine position.   Objective: BP (!) 101/54 (BP Location: Left Arm)   Pulse (!) 43   Temp 97.6 F (36.4 C) (Oral)   Resp 20   Ht 5\' 8"  (1.727 m)   LMP 09/17/2022   SpO2 98%   Breastfeeding Unknown   BMI 33.83 kg/m    Physical Exam:  General: cooperative, fatigued, no distress, and pale Breasts: soft/nontender Pulm: nl effort Abdomen: soft, non-tender, active bowel sounds Uterine Fundus: firm Incision: no significant drainage, covered with occlusive OP site  Perineum: minimal edema, intact Lochia: appropriate DVT Evaluation: No evidence of DVT seen on physical exam.  Recent Labs    05/15/23 0629 05/16/23 0417  HGB 11.7* 9.4*  HCT 33.4* 27.8*  WBC 11.7* 10.2  PLT 224 189    Assessment/Plan: 27 y.o. [id number] postpartum day # 2  1. Continue routine postpartum care  2. Infant feeding status: breast feeding and expressed breast milk -Infant currently in SCN d/t prematurity  -Up to SCN for feeds. Baby was able to latch with most recent feed -Discussed pumping for feed that is due at 0900 d/t new onset bradycardia and symptoms  -Will plan to assist to SCN for visit for noon feed as long as she is improving   3. Contraception plan: bilateral tubal ligation done during repeat c/ection   4. Acute blood loss anemia - clinically significant.  -Hemodynamically stable.  -She was previously asymptomatic but is now having symptoms of feeling light headed and dizzy.   -Intervention:  Stat CBC ordered   5. Bradycardia  -She has a history of low pulse rate in 50's while resting without symptoms but is now feeling light headed and dizzy when she gets up. Discussed that symptoms could be related to pulse rate, anemia, or narcotic medications.  -Blood pressures typically 100's/70's now 101/54, pulse ox and respirations normal  -Tele monitor applied - sinus brady for current rhythm  -EKG ordered -Stat labs ordered - CBC, BMP, Magnesium  -LR fluid bolus x 500 ml   Disposition: continue inpatient postpartum care    LOS: 2 days   Gustavo Lah, CNM 05/17/2023, 8:43 AM   ----- Margaretmary Eddy  Certified Nurse Midwife [facility]
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
## 2023-05-17 NOTE — Plan of Care (Signed)
  Problem: Education: Goal: Knowledge of General Education information will improve Description: Including pain rating scale, medication(s)/side effects and non-pharmacologic comfort measures Outcome: Progressing   Problem: Health Behavior/Discharge Planning: Goal: Ability to manage health-related needs will improve Outcome: Progressing   Problem: Clinical Measurements: Goal: Ability to maintain clinical measurements within normal limits will improve Outcome: Progressing Goal: Will remain free from infection Outcome: Progressing Goal: Diagnostic test results will improve Outcome: Progressing Goal: Respiratory complications will improve Outcome: Progressing Goal: Cardiovascular complication will be avoided Outcome: Progressing   Problem: Activity: Goal: Risk for activity intolerance will decrease Outcome: Progressing   Problem: Coping: Goal: Level of anxiety will decrease Outcome: Progressing   Problem: Elimination: Goal: Will not experience complications related to bowel motility Outcome: Progressing Goal: Will not experience complications related to urinary retention Outcome: Progressing   Problem: Pain Managment: Goal: General experience of comfort will improve and/or be controlled Outcome: Progressing   Problem: Skin Integrity: Goal: Risk for impaired skin integrity will decrease Outcome: Progressing   Problem: Education: Goal: Knowledge of condition will improve Outcome: Progressing   Problem: Activity: Goal: Will verbalize the importance of balancing activity with adequate rest periods Outcome: Progressing Goal: Ability to tolerate increased activity will improve Outcome: Progressing   Problem: Coping: Goal: Ability to identify and utilize available resources and services will improve Outcome: Progressing   Problem: Life Cycle: Goal: Chance of risk for complications during the postpartum period will decrease Outcome: Progressing   Problem: Role  Relationship: Goal: Ability to demonstrate positive interaction with newborn will improve Outcome: Progressing   Problem: Skin Integrity: Goal: Demonstration of wound healing without infection will improve Outcome: Progressing

## 2023-05-17 NOTE — Progress Notes (Signed)
 Patient stated to RN that she no longer wants Dr. Maisie Fus Schermerhorn to come into her room or manage her care. Patient stated she would like a note made in her chart to make everyone aware of this issue. This RN Community education officer of patients request at this time.

## 2023-05-18 MED ORDER — WITCH HAZEL-GLYCERIN EX PADS: 1.0000 | MEDICATED_PAD | CUTANEOUS | Status: AC | PRN

## 2023-05-18 MED ORDER — COCONUT OIL OIL: 1.0000 | TOPICAL_OIL | Status: AC | PRN

## 2023-05-18 MED ORDER — IBUPROFEN 600 MG PO TABS: 600.0000 mg | ORAL_TABLET | Freq: Four times a day (QID) | ORAL | 0 refills | Status: AC | PRN

## 2023-05-18 MED ORDER — SENNOSIDES-DOCUSATE SODIUM 8.6-50 MG PO TABS: 2.0000 | ORAL_TABLET | ORAL | Status: AC

## 2023-05-18 MED ORDER — OXYCODONE HCL 5 MG PO TABS: 5.0000 mg | ORAL_TABLET | ORAL | 0 refills | Status: AC | PRN

## 2023-05-18 MED ORDER — DIBUCAINE (PERIANAL) 1 % EX OINT: 1.0000 | TOPICAL_OINTMENT | CUTANEOUS | Status: AC | PRN

## 2023-05-18 MED ORDER — FERROUS SULFATE 325 (65 FE) MG PO TABS: 325.0000 mg | ORAL_TABLET | ORAL | 0 refills | Status: AC

## 2023-05-18 MED ORDER — ACETAMINOPHEN 500 MG PO TABS: 1000.0000 mg | ORAL_TABLET | Freq: Four times a day (QID) | ORAL | 0 refills | Status: AC | PRN

## 2023-05-18 MED ORDER — VARICELLA VIRUS VACCINE LIVE 1350 PFU/0.5ML IJ SUSR
0.5000 mL | INTRAMUSCULAR | Status: AC | PRN
  Administered 2023-05-18: 0.5 mL via SUBCUTANEOUS
  Filled 2023-05-18: qty 0.5
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
## 2023-05-18 NOTE — Progress Notes (Signed)
 Patient discharged home with family. Discharge instructions, when to follow up, and prescriptions reviewed with patient. Patient verbalized understanding. Patient will be escorted out by auxiliary.

## 2023-05-18 NOTE — Discharge Instructions (Addendum)
 Please continue to take ferrous sulfate 325 mg PO (iron supplements) every other day (Monday, Wednesday, and Friday) to increase your iron levels.

## 2023-05-19 ENCOUNTER — Ambulatory Visit: Payer: Self-pay

## 2023-05-19 NOTE — Lactation Note (Signed)
 This note was copied from a baby's chart. Lactation Consultation Note  Patient Name: Girl Jeanae Whitmill [id number] Date: 05/19/2023 Age:27 days Reason for consult: Weekly NICU follow-up;NICU baby;Late-preterm 34-36.6wks;Engorgement;Maternal discharge   Maternal Data LC met w/ parents of baby girl "[name]" at beside for a check in.  MOB stated that pumping was going well but was concerned about hard spots on her breast.  She stated to [facility] that she pumps every 3hr for about 30 minutes sometimes longer until it stops dripping.   Feeding Mother's Current Feeding Choice: Breast Milk Nipple Type: Dr. Levert Feinstein Preemie  Interventions Interventions: Ice;Education  LC evaluated the pump session during this visit, and with 6 minutes left in the pumping session, several hard spots on the both breast.    LC encouraged mom to try icing prior to a pump session and gentle massage while pumping.  Education was provided on pumping 15 - 20 minutes vs 30-40.    Consult Status Consult Status: PRN Follow-up type: Call as needed    Rhys Martini 05/19/2023, 5:24 PM

## 2023-06-16 ENCOUNTER — Other Ambulatory Visit

## 2023-06-22 ENCOUNTER — Inpatient Hospital Stay: Admit: 2023-06-22 | Payer: Medicaid Other | Admitting: Obstetrics and Gynecology

## 2023-07-10 IMAGING — DX DG CHEST 1V PORT
1 series · 1 of 1 positions shown · non-contrast
Comparison: None.

CLINICAL DATA: Syncope

EXAM:
PORTABLE CHEST 1 VIEW

[chest ap]
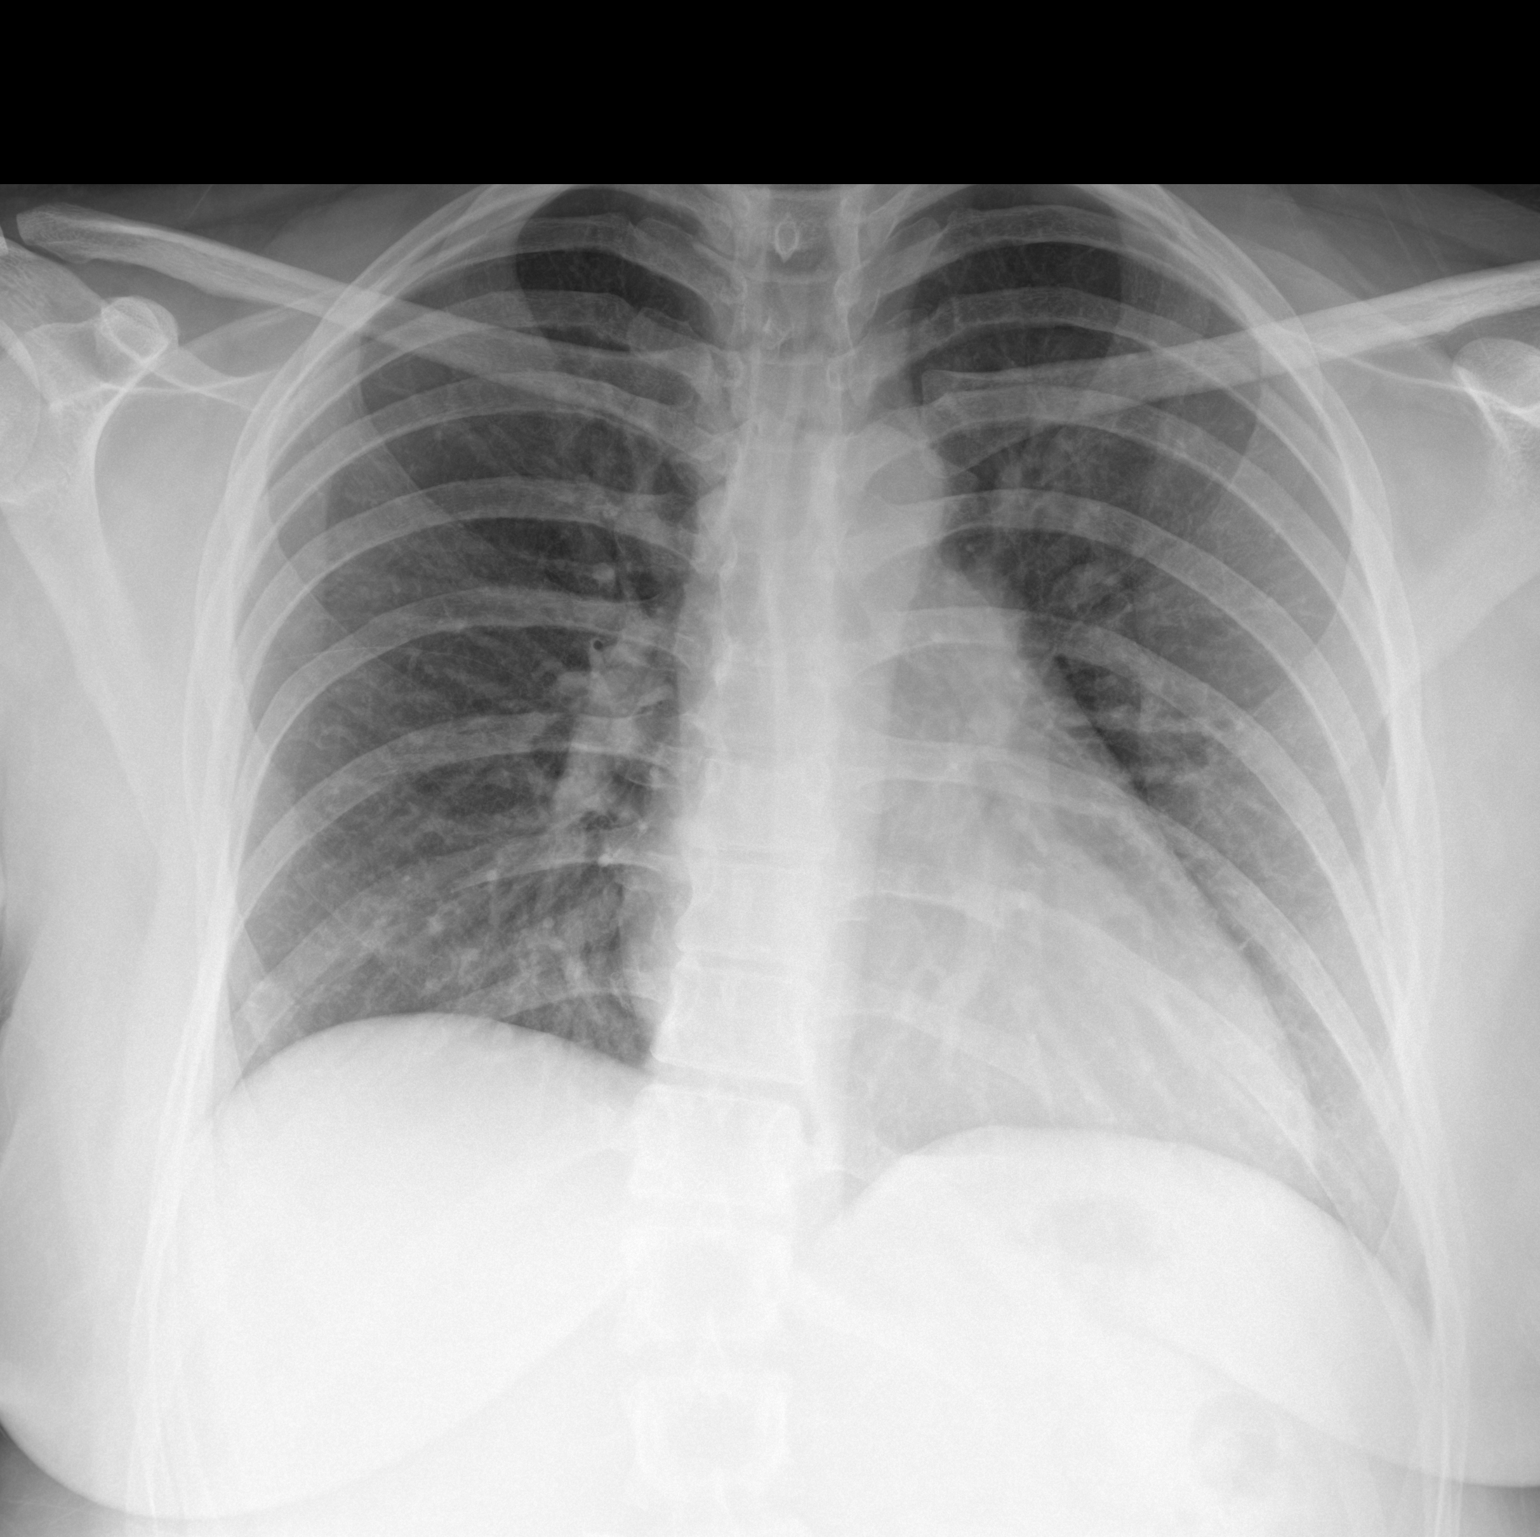

[1 of 1 positions shown; findings below may reference images not displayed]

FINDINGS: The heart size and mediastinal contours are within normal limits.
Both lungs are clear. The visualized skeletal structures are
unremarkable.
IMPRESSION: No active disease.

## 2023-07-22 IMAGING — CR DG CHEST 2V
1 series · 2 of 2 positions shown · non-contrast
Comparison: Radiograph 09/28/2020

CLINICAL DATA: Chest pain

EXAM:
CHEST - 2 VIEW

[Series 1: dg chest 2 view · 0.14mm/px · 2 of 2 slices shown]
[im 1/2]
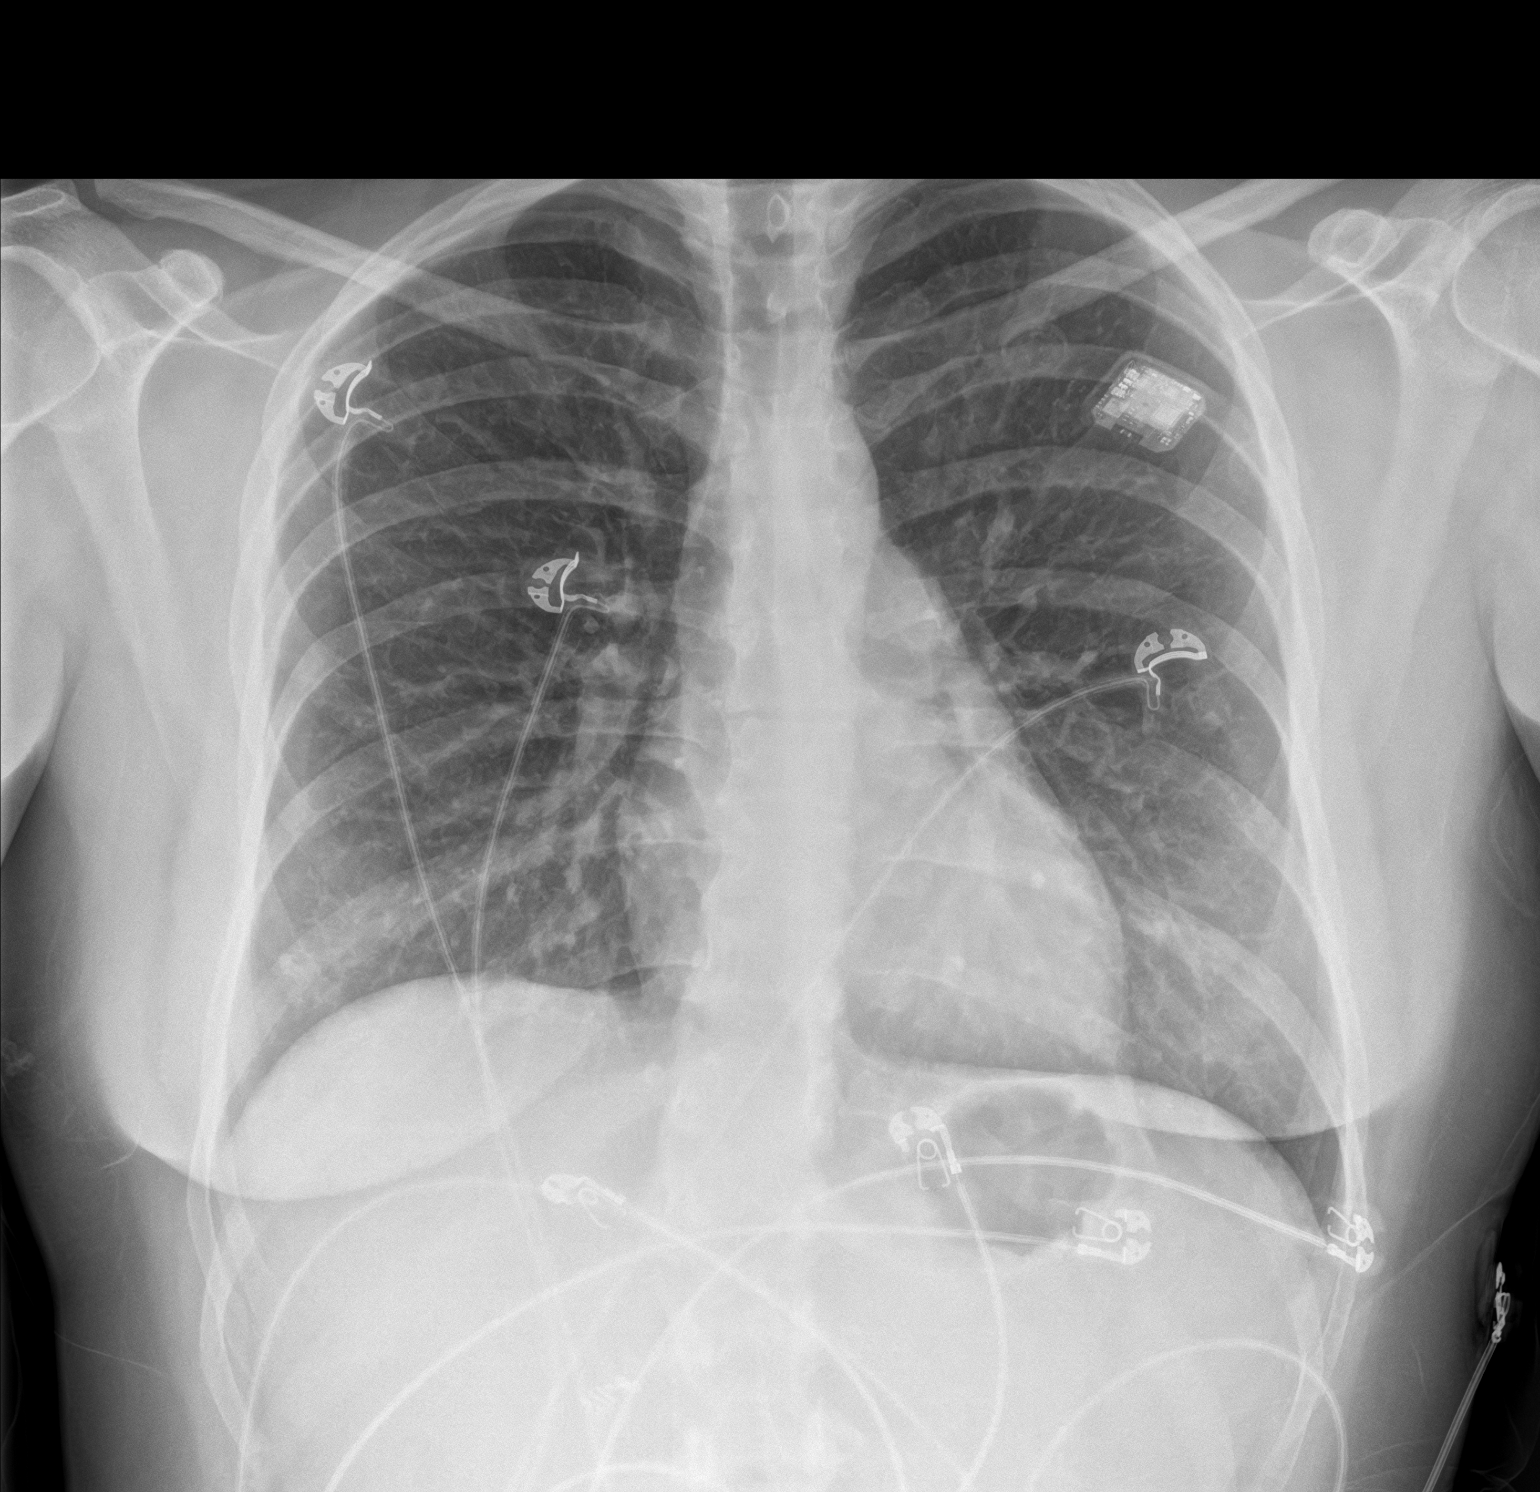
[im 2/2]
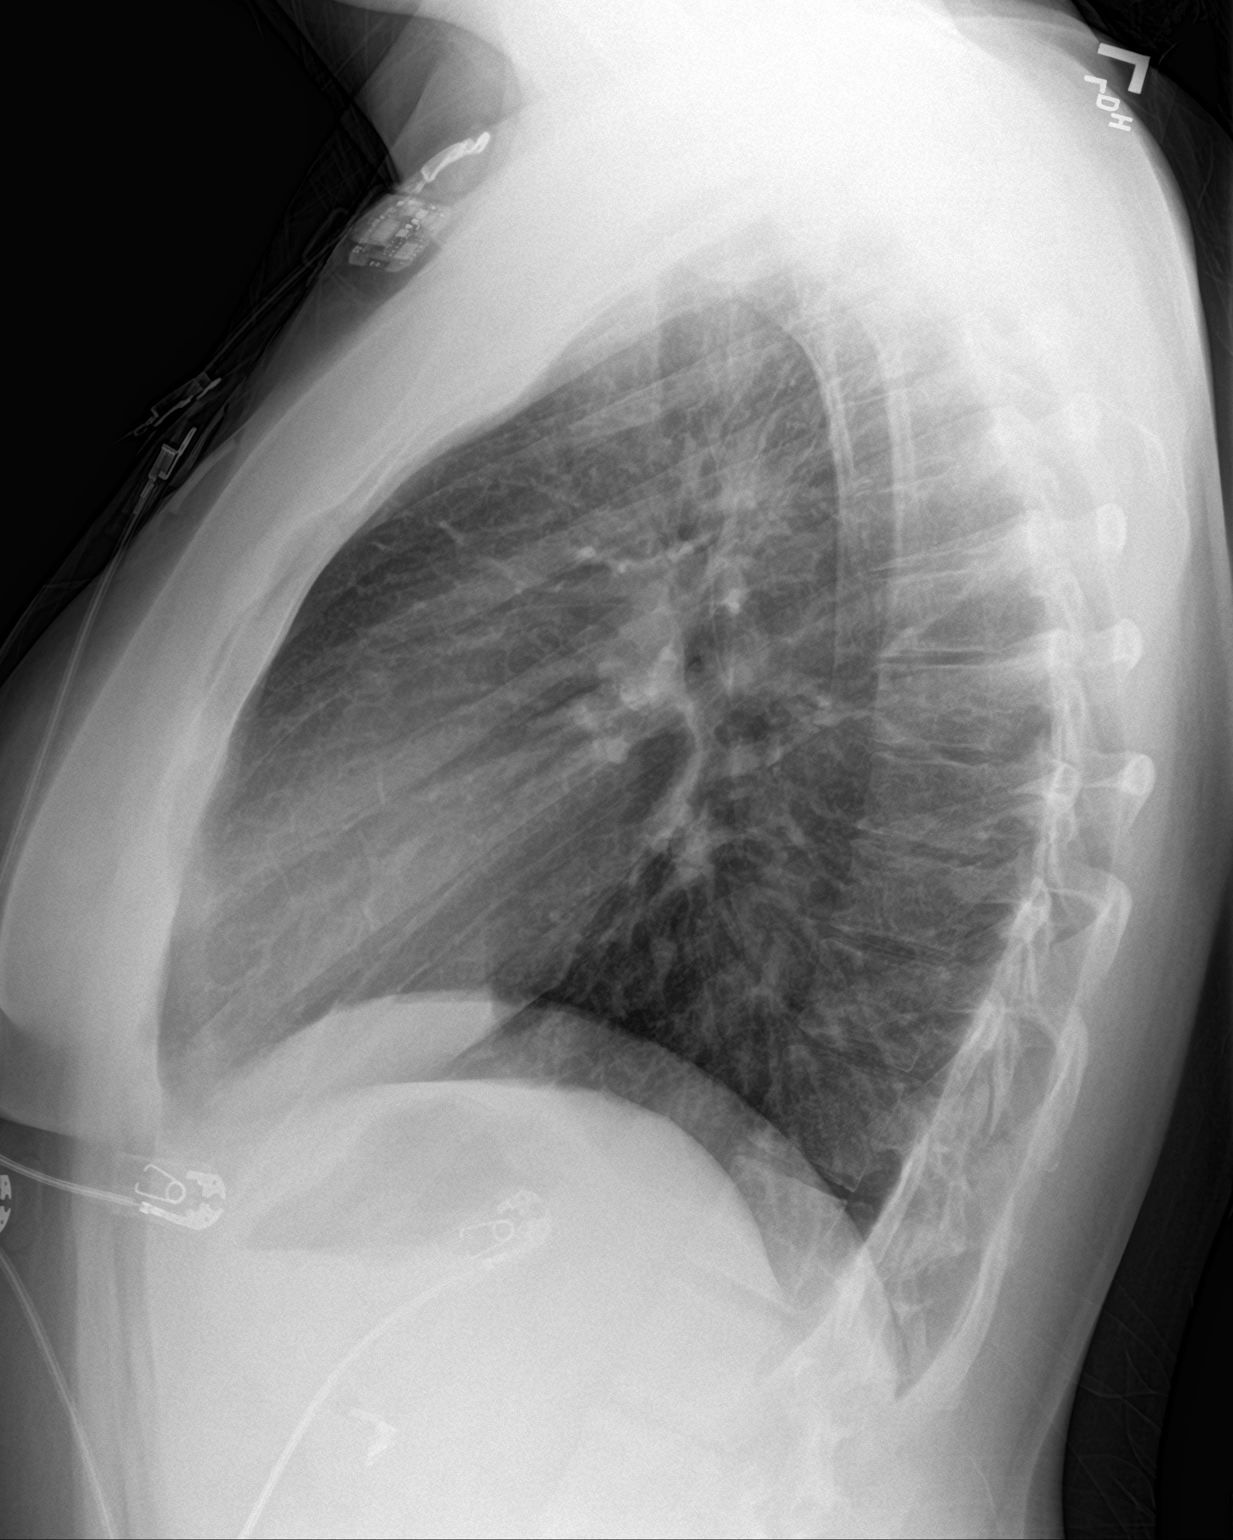

[2 of 2 positions shown; findings below may reference images not displayed]

FINDINGS: Unchanged cardiomediastinal silhouette. There is no focal airspace
disease. There is no large pleural effusion or visible pneumothorax.
There is no acute osseous abnormality.
IMPRESSION: No evidence of acute cardiopulmonary disease.
